# Patient Record
Sex: Female | Born: 1960 | Race: Black or African American | Hispanic: No | Marital: Single | Smoking: Current every day smoker
Health system: Southern US, Community
[De-identification: ages and names within clinical notes are randomized; demographics above are authoritative.]

## PROBLEM LIST (undated history)

## (undated) ENCOUNTER — Ambulatory Visit

## (undated) ENCOUNTER — Encounter

## (undated) ENCOUNTER — Telehealth
Attending: Student in an Organized Health Care Education/Training Program | Primary: Student in an Organized Health Care Education/Training Program

## (undated) ENCOUNTER — Encounter: Attending: Hematology & Oncology | Primary: Hematology & Oncology

## (undated) ENCOUNTER — Telehealth

## (undated) ENCOUNTER — Telehealth: Attending: Hematology & Oncology | Primary: Hematology & Oncology

## (undated) ENCOUNTER — Ambulatory Visit: Payer: MEDICAID | Attending: Hematology & Oncology | Primary: Hematology & Oncology

## (undated) ENCOUNTER — Encounter: Attending: Registered" | Primary: Registered"

## (undated) ENCOUNTER — Ambulatory Visit: Payer: MEDICAID

## (undated) ENCOUNTER — Ambulatory Visit: Payer: PRIVATE HEALTH INSURANCE | Attending: Hematology & Oncology | Primary: Hematology & Oncology

## (undated) ENCOUNTER — Encounter
Attending: Student in an Organized Health Care Education/Training Program | Primary: Student in an Organized Health Care Education/Training Program

## (undated) ENCOUNTER — Ambulatory Visit: Payer: PRIVATE HEALTH INSURANCE

## (undated) ENCOUNTER — Encounter: Attending: Internal Medicine | Primary: Internal Medicine

## (undated) ENCOUNTER — Encounter: Attending: Oncology | Primary: Oncology

## (undated) ENCOUNTER — Telehealth: Attending: Oncology | Primary: Oncology

## (undated) ENCOUNTER — Telehealth: Attending: Registered" | Primary: Registered"

## (undated) ENCOUNTER — Ambulatory Visit: Payer: MEDICAID | Attending: Registered" | Primary: Registered"

## (undated) ENCOUNTER — Inpatient Hospital Stay

## (undated) DIAGNOSIS — I10 Essential (primary) hypertension: Secondary | ICD-10-CM

## (undated) HISTORY — PX: TUBAL LIGATION: SHX77

## (undated) HISTORY — PX: ABDOMINAL SURGERY: SHX537

---
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
## 2004-05-16 ENCOUNTER — Other Ambulatory Visit: Payer: Self-pay

## 2005-06-11 ENCOUNTER — Emergency Department: Payer: Self-pay | Admitting: Emergency Medicine

## 2005-06-14 ENCOUNTER — Emergency Department: Payer: Self-pay | Admitting: Emergency Medicine

## 2007-08-11 ENCOUNTER — Emergency Department: Payer: Self-pay | Admitting: Internal Medicine

## 2007-11-10 ENCOUNTER — Emergency Department: Payer: Self-pay | Admitting: Emergency Medicine

## 2007-12-30 DIAGNOSIS — J309 Allergic rhinitis, unspecified: Secondary | ICD-10-CM | POA: Insufficient documentation

## 2008-03-12 ENCOUNTER — Emergency Department: Payer: Self-pay | Admitting: Emergency Medicine

## 2008-06-19 ENCOUNTER — Emergency Department: Payer: Self-pay | Admitting: Emergency Medicine

## 2009-03-16 ENCOUNTER — Emergency Department: Payer: Self-pay | Admitting: Emergency Medicine

## 2009-08-15 ENCOUNTER — Emergency Department: Payer: Self-pay

## 2009-10-19 IMAGING — CT CT ABD-PELV W/O CM
1 of 2 series · 15 of 32 positions shown, 19 images · non-contrast
Comparison: none

REASON FOR EXAM: (1) rlq /flank pain; (2) rlq /flank pain
COMMENTS:

[Series 2: stone · axial · 0.64mm/px · z∈[-176,+226]mm · 15 of 146 slices shown, 19 images]
[im 6/146  soft-tissue]
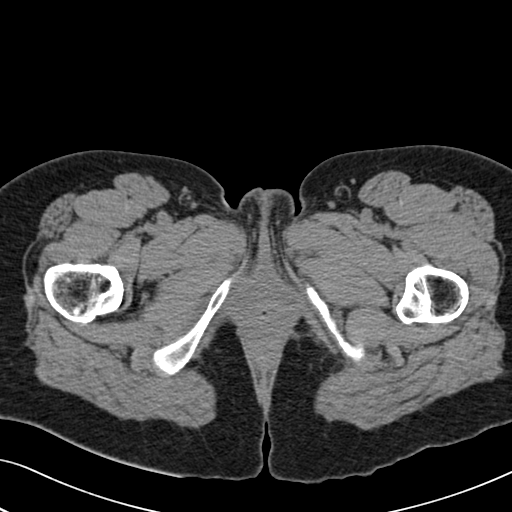
[im 6/146  bone]
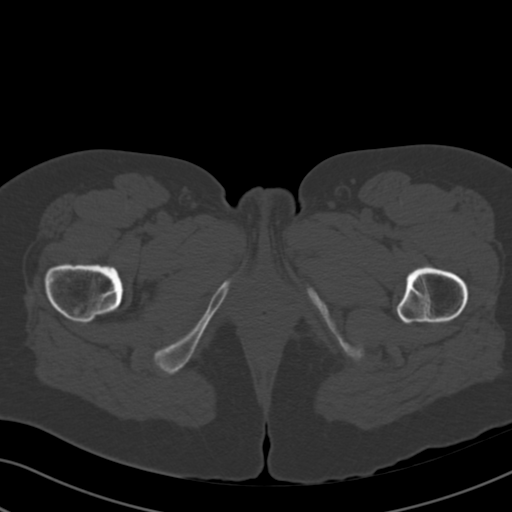
[im 17/146  soft-tissue]
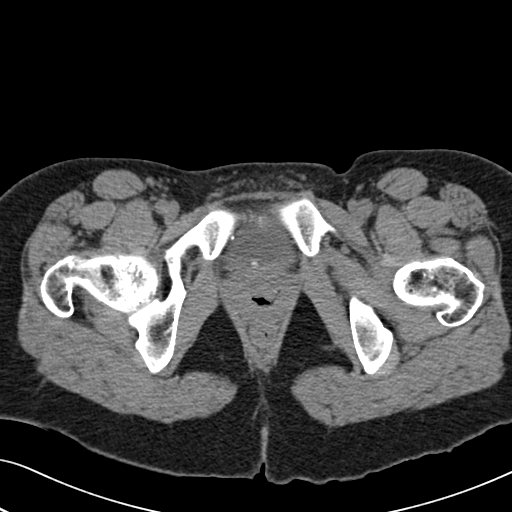
[im 28/146  soft-tissue]
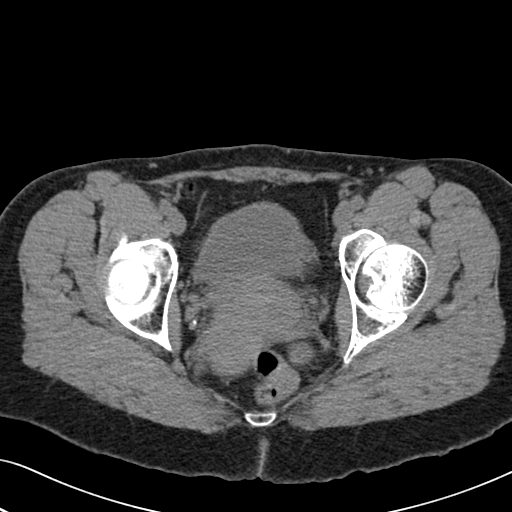
[im 40/146  soft-tissue]
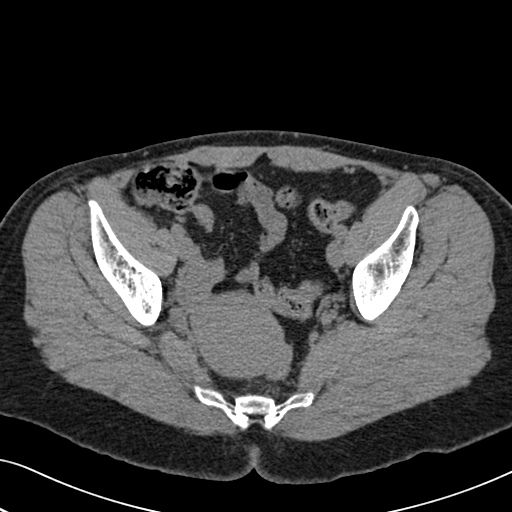
[im 51/146  soft-tissue]
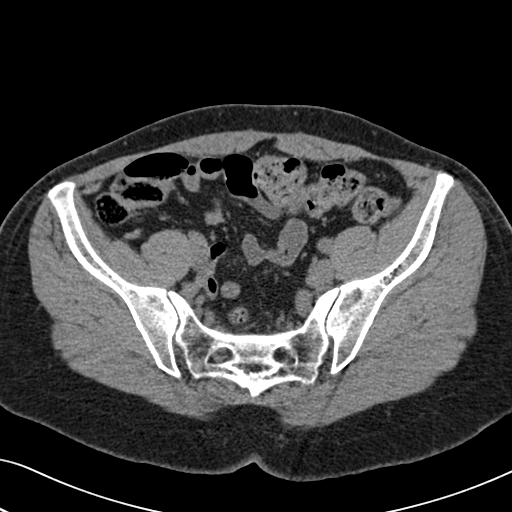
[im 62/146  soft-tissue]
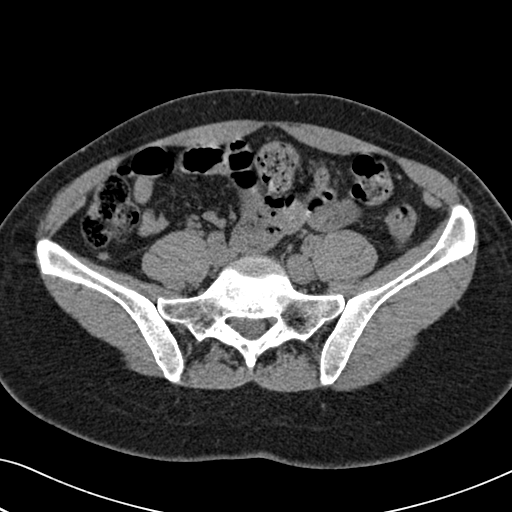
[im 73/146  soft-tissue]
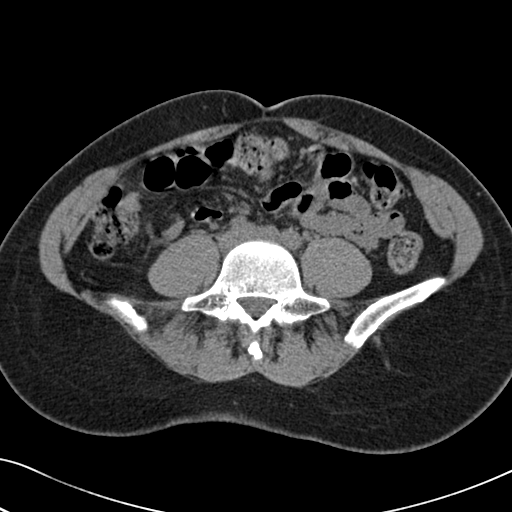
[im 84/146  soft-tissue]
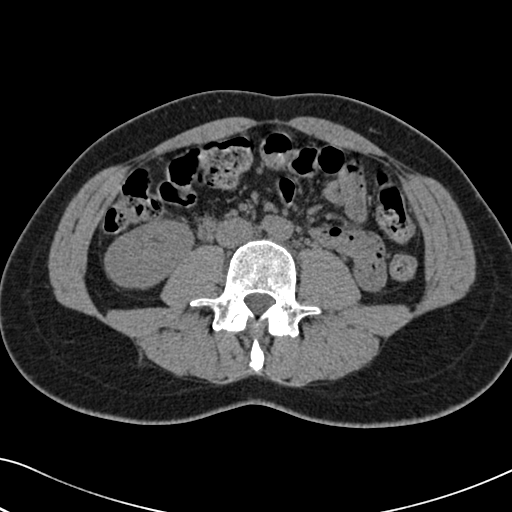
[im 95/146  soft-tissue]
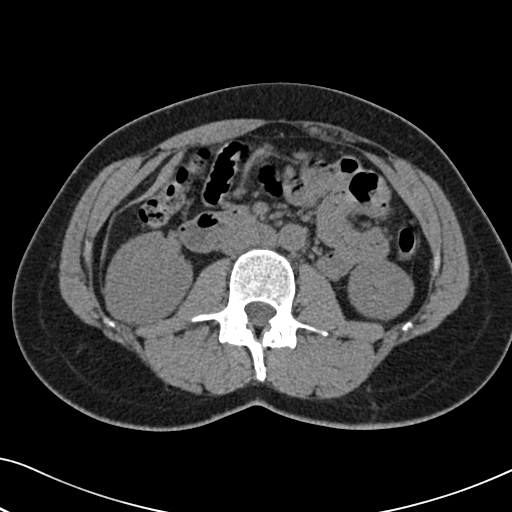
[im 95/146  bone]
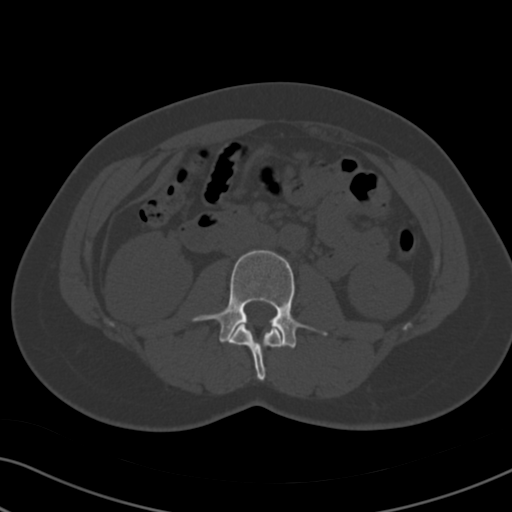
[im 106/146  soft-tissue]
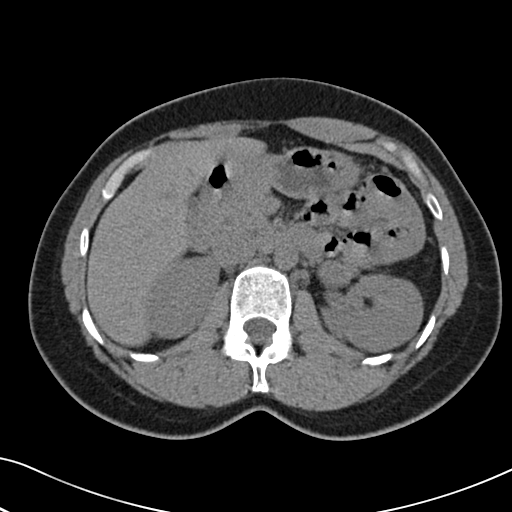
[im 118/146  soft-tissue]
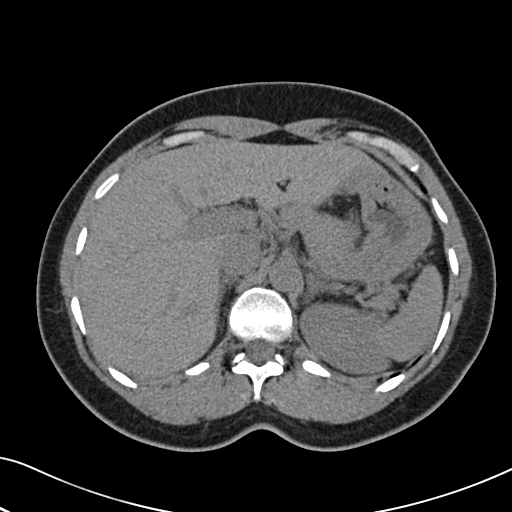
[im 123/146  lung]
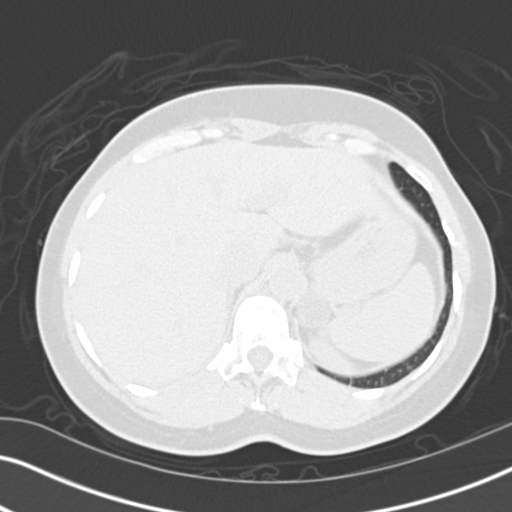
[im 129/146  soft-tissue]
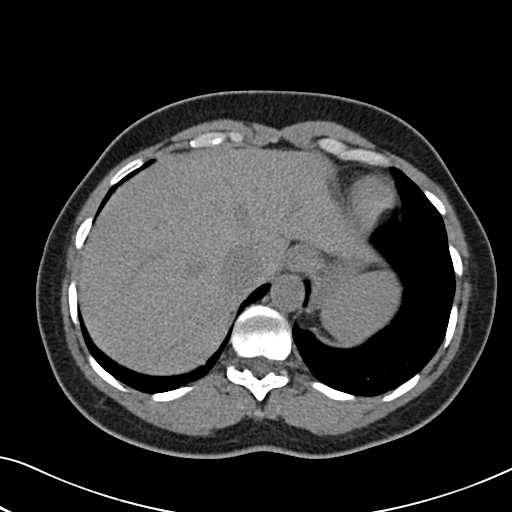
[im 129/146  lung]
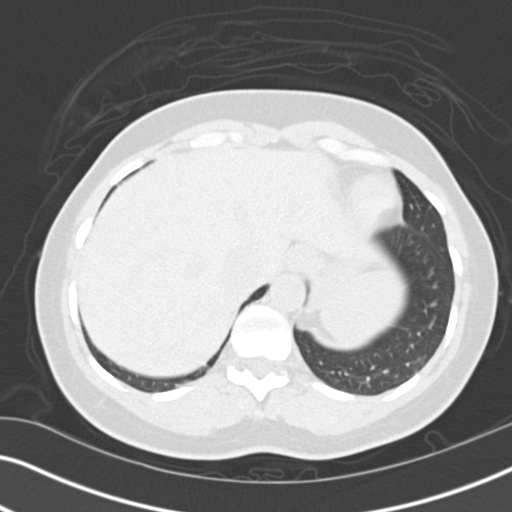
[im 134/146  lung]
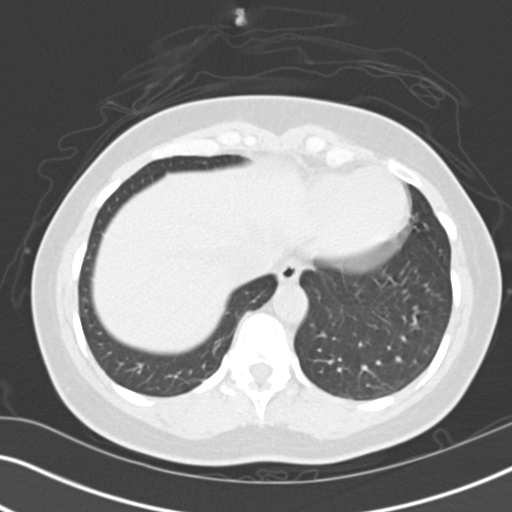
[im 140/146  soft-tissue]
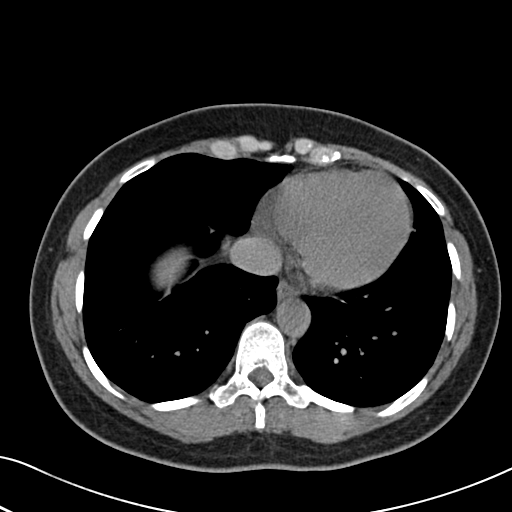
[im 140/146  lung]
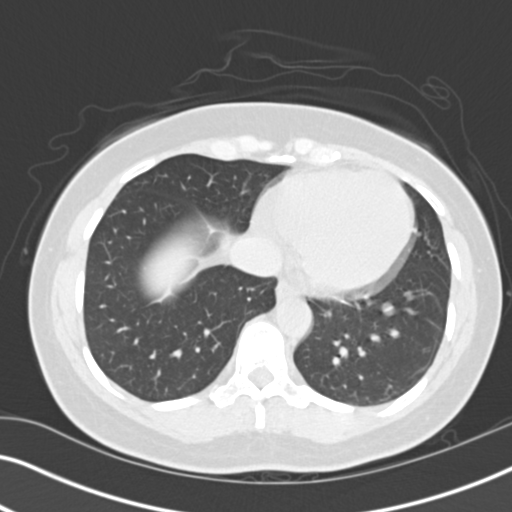

[15 of 32 positions shown; findings below may reference images not displayed]

PROCEDURE:     CT  - CT ABDOMEN AND PELVIS W[DATE]  [DATE]

RESULT:     The study was performed without IV contrast as requested. The
patient is undergoing evaluation for RIGHT flank discomfort.

The kidneys exhibit normal density. There is no evidence of hydronephrosis
or calcified stones. The perinephric fat is normal in density bilaterally.
Along the expected course of the ureters, I see no abnormal calcification.
The urinary bladder is normal in appearance. There is a calcification that
lies along the extreme inferior aspect of the urinary bladder just to the
RIGHT of midline that may reflect a recently passed stone. There are
phleboliths within the pelvis. The uterus and adnexal structures exhibit no
acute abnormality. The appendix is demonstrated and is normal in appearance.
The unopacified loops of small and large bowel are normal in appearance. The
lumbar vertebral bodies are preserved in height.

The liver, spleen, nondistended stomach, pancreas, and RIGHT adrenal gland
are normal in appearance. The LEFT adrenal gland is enlarged and low density
measuring 2.0 cm in diameter. The Hounsfield measurement is approximately
-15. The gallbladder is contracted or surgically absent. The lung bases are
clear.
IMPRESSION: 1. I do not see evidence of urinary tract stones currently. There is a
calcification in the lumen of the urinary bladder that could reflect a
recently passed stone. No other abnormality of the kidneys is identified.
2. The appendix is normal in appearance. There is no evidence of bowel
obstruction or diverticulitis.
3. I see no acute abnormality of the uterus or ovaries where visualized.
There is a trace of free fluid in the pelvis.
4. The gallbladder is either contracted or surgically absent.
5. There is enlargement of the LEFT adrenal gland. Further evaluation with
MRI electively would be useful.

This report was called to Dr. Nomasibulele in the [HOSPITAL] the
conclusion of the study.

## 2010-09-22 ENCOUNTER — Inpatient Hospital Stay (HOSPITAL_COMMUNITY)
Admission: AD | Admit: 2010-09-22 | Discharge: 2010-09-22 | Payer: Self-pay | Source: Home / Self Care | Attending: Obstetrics & Gynecology | Admitting: Obstetrics & Gynecology

## 2010-09-22 LAB — CBC
HCT: 35.5 % — ABNORMAL LOW (ref 36.0–46.0)
Hemoglobin: 11.2 g/dL — ABNORMAL LOW (ref 12.0–15.0)
MCHC: 31.5 g/dL (ref 30.0–36.0)
MCV: 80.7 fL (ref 78.0–100.0)

## 2010-09-22 LAB — POCT PREGNANCY, URINE: Preg Test, Ur: NEGATIVE

## 2010-09-22 LAB — URINALYSIS, ROUTINE W REFLEX MICROSCOPIC
Ketones, ur: NEGATIVE mg/dL
Leukocytes, UA: NEGATIVE
Protein, ur: NEGATIVE mg/dL
Urine Glucose, Fasting: NEGATIVE mg/dL
Urobilinogen, UA: 0.2 mg/dL (ref 0.0–1.0)

## 2010-09-22 LAB — WET PREP, GENITAL: Trich, Wet Prep: NONE SEEN

## 2010-09-22 LAB — URINE MICROSCOPIC-ADD ON

## 2010-09-23 LAB — GC/CHLAMYDIA PROBE AMP, GENITAL
Chlamydia, DNA Probe: NEGATIVE
GC Probe Amp, Genital: NEGATIVE
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
## 2011-06-20 ENCOUNTER — Emergency Department: Payer: Self-pay | Admitting: Emergency Medicine

## 2011-10-13 ENCOUNTER — Emergency Department: Payer: Self-pay | Admitting: *Deleted

## 2011-10-13 LAB — COMPREHENSIVE METABOLIC PANEL
Alkaline Phosphatase: 109 U/L (ref 50–136)
Calcium, Total: 9.1 mg/dL (ref 8.5–10.1)
Co2: 30 mmol/L (ref 21–32)
EGFR (African American): >60
EGFR (Non-African Amer.): 58 — ABNORMAL LOW
Osmolality: 281 (ref 275–301)
SGOT(AST): 27 U/L (ref 15–37)
SGPT (ALT): 26 U/L
Sodium: 141 mmol/L (ref 136–145)

## 2011-10-13 LAB — URINALYSIS, COMPLETE
Blood: NEGATIVE
Leukocyte Esterase: NEGATIVE
Ph: 7 (ref 4.5–8.0)
Protein: NEGATIVE
RBC,UR: 7 /HPF (ref 0–5)

## 2011-10-13 LAB — CBC
HCT: 40.9 % (ref 35.0–47.0)
MCHC: 33.3 g/dL (ref 32.0–36.0)
MCV: 79 fL — ABNORMAL LOW (ref 80–100)
RBC: 5.16 10*6/uL (ref 3.80–5.20)
RDW: 15.2 % — ABNORMAL HIGH (ref 11.5–14.5)
WBC: 4.9 10*3/uL (ref 3.6–11.0)

## 2011-10-13 LAB — LIPASE, BLOOD: Lipase: 138 U/L (ref 73–393)

## 2011-10-14 LAB — PREGNANCY, URINE: Pregnancy Test, Urine: NEGATIVE m[IU]/mL

## 2011-11-03 DIAGNOSIS — F17200 Nicotine dependence, unspecified, uncomplicated: Secondary | ICD-10-CM | POA: Insufficient documentation

## 2011-11-03 DIAGNOSIS — I10 Essential (primary) hypertension: Secondary | ICD-10-CM | POA: Insufficient documentation

## 2012-04-05 DIAGNOSIS — Z8619 Personal history of other infectious and parasitic diseases: Secondary | ICD-10-CM | POA: Insufficient documentation

## 2013-01-25 ENCOUNTER — Emergency Department: Payer: Self-pay | Admitting: Emergency Medicine

## 2013-04-15 DIAGNOSIS — R519 Headache, unspecified: Secondary | ICD-10-CM | POA: Insufficient documentation

## 2013-06-05 DIAGNOSIS — Z0000 Encounter for general adult medical examination without abnormal findings: Secondary | ICD-10-CM | POA: Insufficient documentation

## 2013-06-05 DIAGNOSIS — N951 Menopausal and female climacteric states: Secondary | ICD-10-CM | POA: Insufficient documentation

## 2013-06-05 DIAGNOSIS — D259 Leiomyoma of uterus, unspecified: Secondary | ICD-10-CM | POA: Insufficient documentation

## 2014-04-19 DIAGNOSIS — R252 Cramp and spasm: Secondary | ICD-10-CM | POA: Insufficient documentation

## 2014-06-14 DIAGNOSIS — F419 Anxiety disorder, unspecified: Secondary | ICD-10-CM | POA: Insufficient documentation

## 2015-05-15 ENCOUNTER — Encounter: Payer: Self-pay | Admitting: Emergency Medicine

## 2015-05-15 ENCOUNTER — Emergency Department
Admission: EM | Admit: 2015-05-15 | Discharge: 2015-05-15 | Disposition: A | Discharge status: Home / Self Care | Payer: Medicaid Other | Attending: Emergency Medicine | Admitting: Emergency Medicine

## 2015-05-15 DIAGNOSIS — R102 Pelvic and perineal pain: Secondary | ICD-10-CM | POA: Insufficient documentation

## 2015-05-15 DIAGNOSIS — R1013 Epigastric pain: Secondary | ICD-10-CM | POA: Diagnosis not present

## 2015-05-15 DIAGNOSIS — Z87891 Personal history of nicotine dependence: Secondary | ICD-10-CM | POA: Diagnosis not present

## 2015-05-15 DIAGNOSIS — R110 Nausea: Secondary | ICD-10-CM | POA: Insufficient documentation

## 2015-05-15 DIAGNOSIS — R1030 Lower abdominal pain, unspecified: Secondary | ICD-10-CM | POA: Diagnosis not present

## 2015-05-15 DIAGNOSIS — R001 Bradycardia, unspecified: Secondary | ICD-10-CM | POA: Insufficient documentation

## 2015-05-15 DIAGNOSIS — R309 Painful micturition, unspecified: Secondary | ICD-10-CM | POA: Insufficient documentation

## 2015-05-15 DIAGNOSIS — I10 Essential (primary) hypertension: Secondary | ICD-10-CM | POA: Diagnosis not present

## 2015-05-15 DIAGNOSIS — R300 Dysuria: Secondary | ICD-10-CM

## 2015-05-15 HISTORY — DX: Essential (primary) hypertension: I10

## 2015-05-15 LAB — URINALYSIS COMPLETE WITH MICROSCOPIC (ARMC ONLY)
BACTERIA UA: NONE SEEN
Bilirubin Urine: NEGATIVE
Glucose, UA: NEGATIVE mg/dL
Ketones, ur: NEGATIVE mg/dL
LEUKOCYTES UA: NEGATIVE
Nitrite: NEGATIVE
PH: 5 (ref 5.0–8.0)
PROTEIN: NEGATIVE mg/dL
Specific Gravity, Urine: 1.024 (ref 1.005–1.030)

## 2015-05-15 LAB — COMPREHENSIVE METABOLIC PANEL
ALT: 16 U/L (ref 14–54)
AST: 19 U/L (ref 15–41)
Albumin: 4.2 g/dL (ref 3.5–5.0)
Alkaline Phosphatase: 103 U/L (ref 38–126)
Anion gap: 7 (ref 5–15)
BUN: 22 mg/dL — ABNORMAL HIGH (ref 6–20)
CHLORIDE: 103 mmol/L (ref 101–111)
CO2: 29 mmol/L (ref 22–32)
CREATININE: 0.92 mg/dL (ref 0.44–1.00)
Calcium: 9.6 mg/dL (ref 8.9–10.3)
GFR calc non Af Amer: >60 mL/min (ref >60)
Glucose, Bld: 107 mg/dL — ABNORMAL HIGH (ref 65–99)
POTASSIUM: 3.9 mmol/L (ref 3.5–5.1)
SODIUM: 139 mmol/L (ref 135–145)
Total Bilirubin: 0.1 mg/dL — ABNORMAL LOW (ref 0.3–1.2)
Total Protein: 7.6 g/dL (ref 6.5–8.1)

## 2015-05-15 LAB — CBC
HEMATOCRIT: 43.4 % (ref 35.0–47.0)
HEMOGLOBIN: 14.1 g/dL (ref 12.0–16.0)
MCH: 26.5 pg (ref 26.0–34.0)
MCHC: 32.4 g/dL (ref 32.0–36.0)
MCV: 81.9 fL (ref 80.0–100.0)
PLATELETS: 210 10*3/uL (ref 150–440)
RBC: 5.3 MIL/uL — AB (ref 3.80–5.20)
RDW: 13.1 % (ref 11.5–14.5)
WBC: 6.7 10*3/uL (ref 3.6–11.0)

## 2015-05-15 LAB — CHLAMYDIA/NGC RT PCR (ARMC ONLY)
CHLAMYDIA TR: NOT DETECTED
N GONORRHOEAE: NOT DETECTED

## 2015-05-15 LAB — WET PREP, GENITAL
Clue Cells Wet Prep HPF POC: NONE SEEN
Trich, Wet Prep: NONE SEEN
Yeast Wet Prep HPF POC: NONE SEEN

## 2015-05-15 LAB — LIPASE, BLOOD: LIPASE: 33 U/L (ref 22–51)

## 2015-05-15 MED ORDER — IBUPROFEN 600 MG PO TABS: 600.0000 mg | ORAL_TABLET | Freq: Three times a day (TID) | ORAL | Status: DC | PRN

## 2015-05-15 MED ORDER — OXYCODONE-ACETAMINOPHEN 5-325 MG PO TABS
1.0000 | ORAL_TABLET | Freq: Once | ORAL | Status: AC
  Administered 2015-05-15: 1 via ORAL
  Filled 2015-05-15: qty 1

## 2015-05-15 MED ORDER — ONDANSETRON HCL 4 MG PO TABS: 4.0000 mg | ORAL_TABLET | Freq: Three times a day (TID) | ORAL | Status: AC | PRN

## 2015-05-15 MED ORDER — OXYCODONE-ACETAMINOPHEN 5-325 MG PO TABS: 1.0000 | ORAL_TABLET | Freq: Four times a day (QID) | ORAL | Status: DC | PRN

## 2015-05-15 NOTE — ED Provider Notes (Signed)
[facility] Emergency Department Provider Note  ____________________________________________  Time seen: Approximately 5:58 PM  I have reviewed the triage vital signs and the nursing notes.   HISTORY  Chief Complaint Abdominal Pain    HPI Kristie Macdonald is a 54 y.o. female with a history of HTN presenting with abdominal pain times several months. Patient reports that for the past 3-4 months she has had daily "soreness" that comes and goes diffusely in the lower abdomen and radiating into her vagina. Occasional epigastric pain as well.. There is no change with position, activity, food, or in the time of day. She reports the pain has been worse over the last 5 days and is now 10/10 and "unbearable." She has intermittent occasional nausea which she does not have right now. No vomiting, no constipation or diarrhea, dark stools, or bloody stools. No fevers, chills, or change in vaginal discharge.   Past Medical History  Diagnosis Date  . Hypertension     There are no active problems to display for this patient.   Past Surgical History  Procedure Laterality Date  . Abdominal surgery    . Cesarean section      Current Outpatient Rx  Name  Route  Sig  Dispense  Refill  . ibuprofen (ADVIL,MOTRIN) 600 MG tablet   Oral   Take 1 tablet (600 mg total) by mouth every 8 (eight) hours as needed for moderate pain (with food).   15 tablet   0   . ondansetron (ZOFRAN) 4 MG tablet   Oral   Take 1 tablet (4 mg total) by mouth every 8 (eight) hours as needed for nausea or vomiting.   15 tablet   0   . oxyCODONE-acetaminophen (PERCOCET/ROXICET) 5-325 MG per tablet   Oral   Take 1 tablet by mouth every 6 (six) hours as needed for severe pain.   12 tablet   0     Allergies Review of patient's allergies indicates no known allergies.  No family history on file.  Social History Social History  Substance Use Topics  . Smoking status: Former Research scientist (life sciences)  .  Smokeless tobacco: None  . Alcohol Use: No    Review of Systems Constitutional: No fever/chills Eyes: No visual changes. ENT: No sore throat. Cardiovascular: Denies chest pain, palpitations. Respiratory: Denies shortness of breath.  No cough. Gastrointestinal: LLQ, RLQ and epigastric pain.  Intermittent nausea, no vomiting.  No diarrhea.  No constipation. Genitourinary: Occasional burning with urination, increased frequency "when I drink a lot." No change in vaginal discharge. Musculoskeletal: Negative for back pain. Skin: Negative for rash. Neurological: Negative for headaches, focal weakness or numbness.  10-point ROS otherwise negative.  ____________________________________________   PHYSICAL EXAM:  VITAL SIGNS: ED Triage Vitals  Enc Vitals Group     BP 05/15/15 1539 157/83 mmHg     Pulse Rate 05/15/15 1539 79     Resp 05/15/15 1539 20     Temp 05/15/15 1539 97.8 F (36.6 C)     Temp Source 05/15/15 1539 Oral     SpO2 05/15/15 1539 100 %     Weight 05/15/15 1539 155 lb (70.308 kg)     Height 05/15/15 1539 5\' 7"  (1.702 m)     Head Cir --      Peak Flow --      Pain Score 05/15/15 1539 10     Pain Loc --      Pain Edu? --      Excl. in [place]? --  Constitutional: Alert and oriented. Well appearing and in no acute distress. Answer question appropriately. Eyes: Conjunctivae are normal.  EOMI. Head: Atraumatic. Nose: No congestion/rhinnorhea. Mouth/Throat: Mucous membranes are moist.  Neck: No stridor.  Supple.   Cardiovascular: Normal rate, regular rhythm. No murmurs, rubs or gallops.  Respiratory: Normal respiratory effort.  No retractions. Lungs CTAB.  No wheezes, rales or ronchi. Gastrointestinal: Soft and nondistended. Diffuse tenderness to palpation in the lower abdomen. No rebound, guarding, or peritoneal signs. Genitourinary: Normal-appearing external genitalia without lesions. Normal vaginal exam with physiologic discharge, normal-appearing cervix, normal  vaginal wall tissue. Bimanual exam is negative for CMT.  Diffuse lower pelvis mild ttp w/o focality. No palpable masses. Musculoskeletal: No LE edema.  Neurologic:  Normal speech and language. No gross focal neurologic deficits are appreciated.  Skin:  Skin is warm, dry and intact. No rash noted. Psychiatric: Mood and affect are normal. Speech and behavior are normal.  Normal judgement.  ____________________________________________   LABS (all labs ordered are listed, but only abnormal results are displayed)  Labs Reviewed  COMPREHENSIVE METABOLIC PANEL - Abnormal; Notable for the following:    Glucose, Bld 107 (*)    BUN 22 (*)    Total Bilirubin 0.1 (*)    All other components within normal limits  CBC - Abnormal; Notable for the following:    RBC 5.30 (*)    All other components within normal limits  URINALYSIS COMPLETEWITH MICROSCOPIC ([facility] ONLY) - Abnormal; Notable for the following:    Color, Urine YELLOW (*)    APPearance CLEAR (*)    Hgb urine dipstick 1+ (*)    Squamous Epithelial / LPF 0-5 (*)    All other components within normal limits  CHLAMYDIA/NGC RT PCR ([facility] ONLY)  WET PREP, GENITAL  LIPASE, BLOOD   ____________________________________________  EKG  ED ECG REPORT I, Eula Listen, the attending physician, personally viewed and interpreted this ECG.   Date: 05/15/2015  EKG Time: 16:00  Rate: 55  Rhythm: sinus bradycardia  Axis: Leftward  Intervals:none  ST&T Change: Nonspecific T wave inversions in V1.  EKG is unchanged from prior in 2005.  ____________________________________________  RADIOLOGY  No results found.  ____________________________________________   PROCEDURES  Procedure(s) performed: None  Critical Care performed: No ____________________________________________   INITIAL IMPRESSION / ASSESSMENT AND PLAN / ED COURSE  Pertinent labs & imaging results that were available during my care of the patient were  reviewed by me and considered in my medical decision making (see chart for details).  54 y.o. female with a history of HTN presenting with months of abdominal pain, worse over the last several days. Her vital signs are stable and she is afebrile. She does not give a history or physical exam findings that would be consistent with acute surgical pathology. Differential includes STI, appendicitis or other acute abdominal pathology or infection is less likely given the length of the time frame and lack of infectious symptoms, rule out UTI.   Will get a pelvic exam to evaluate for infection. Labs are reassuring with no elevation in the white blood cell count, negative for UTI. After pelvic exam, we'll treat if infection found, and plan discharge with PMD for further evaluation.  ----------------------------------------- 7:04 PM on 05/15/2015 -----------------------------------------  Patient pain has completely resolved with a single Percocet. She is feeling much better. Her urine does not show infection, her labs are reassuring. Pelvic exam is also reassuring. I have discussed the results with the patient will follow-up with her primary care  physician. She understands that she'll need to follow-up her pelvic cultures. Plan discharge.  ----------------------------------------- 7:20 PM on 05/15/2015 -----------------------------------------  The patient does have asymptomatic bradycardia. She is beta blocked for her hypertension. She does know that her heart rate is in the 50s today and denies any shortness of breath, lightheadedness, or chest pain, or palpitations. When she makes her follow point and she will discuss this with her primary care physician. ____________________________________________  FINAL CLINICAL IMPRESSION(S) / ED DIAGNOSES  Final diagnoses:  Lower abdominal pain  Vaginal pain  Burning with urination  Nausea without vomiting      NEW MEDICATIONS STARTED DURING THIS  VISIT:  New Prescriptions   IBUPROFEN (ADVIL,MOTRIN) 600 MG TABLET    Take 1 tablet (600 mg total) by mouth every 8 (eight) hours as needed for moderate pain (with food).   ONDANSETRON (ZOFRAN) 4 MG TABLET    Take 1 tablet (4 mg total) by mouth every 8 (eight) hours as needed for nausea or vomiting.   OXYCODONE-ACETAMINOPHEN (PERCOCET/ROXICET) 5-325 MG PER TABLET    Take 1 tablet by mouth every 6 (six) hours as needed for severe pain.     Eula Listen, MD 05/15/15 Lurena Nida

## 2015-05-15 NOTE — Discharge Instructions (Signed)
Abdominal Pain Many things can cause abdominal pain. Usually, abdominal pain is not caused by a disease and will improve without treatment. It can often be observed and treated at home. Your health care provider will do a physical exam and possibly order blood tests and X-rays to help determine the seriousness of your pain. However, in many cases, more time must pass before a clear cause of the pain can be found. Before that point, your health care provider may not know if you need more testing or further treatment. HOME CARE INSTRUCTIONS  Monitor your abdominal pain for any changes. The following actions may help to alleviate any discomfort you are experiencing:  Only take over-the-counter or prescription medicines as directed by your health care provider.  Do not take laxatives unless directed to do so by your health care provider.  Try a clear liquid diet (broth, tea, or water) as directed by your health care provider. Slowly move to a bland diet as tolerated. SEEK MEDICAL CARE IF:  You have unexplained abdominal pain.  You have abdominal pain associated with nausea or diarrhea.  You have pain when you urinate or have a bowel movement.  You experience abdominal pain that wakes you in the night.  You have abdominal pain that is worsened or improved by eating food.  You have abdominal pain that is worsened with eating fatty foods.  You have a fever. SEEK IMMEDIATE MEDICAL CARE IF:   Your pain does not go away within 2 hours.  You keep throwing up (vomiting).  Your pain is felt only in portions of the abdomen, such as the right side or the left lower portion of the abdomen.  You pass bloody or black tarry stools. MAKE SURE YOU:  Understand these instructions.   Will watch your condition.   Will get help right away if you are not doing well or get worse.  Document Released: 05/21/2005 Document Revised: 08/16/2013 Document Reviewed: 04/20/2013 [facility] Patient Information  2015 [place], [place]. This information is not intended to replace advice given to you by your health care provider. Make sure you discuss any questions you have with your health care provider.  Please return to the emergency department if you develop severe pain, fever, inability to keep down fluids, or any other symptoms concerning to you.

## 2015-05-15 NOTE — ED Notes (Signed)
Pt reports abdominal cramping that radiates down into legs x1 week, reports nausea.

## 2015-09-06 ENCOUNTER — Encounter: Payer: Medicaid Other | Admitting: Family Medicine

## 2015-09-20 ENCOUNTER — Encounter: Payer: Medicaid Other | Admitting: Medical

## 2016-01-30 ENCOUNTER — Encounter: Payer: Medicaid Other | Admitting: Obstetrics & Gynecology

## 2016-03-10 ENCOUNTER — Encounter: Payer: Medicaid Other | Admitting: Family Medicine

## 2017-03-23 ENCOUNTER — Emergency Department
Admission: EM | Admit: 2017-03-23 | Discharge: 2017-03-24 | Disposition: A | Discharge status: Home / Self Care | Payer: Medicaid Other | Attending: Emergency Medicine | Admitting: Emergency Medicine

## 2017-03-23 DIAGNOSIS — Y999 Unspecified external cause status: Secondary | ICD-10-CM | POA: Insufficient documentation

## 2017-03-23 DIAGNOSIS — X58XXXA Exposure to other specified factors, initial encounter: Secondary | ICD-10-CM | POA: Diagnosis not present

## 2017-03-23 DIAGNOSIS — Y939 Activity, unspecified: Secondary | ICD-10-CM | POA: Diagnosis not present

## 2017-03-23 DIAGNOSIS — S20469A Insect bite (nonvenomous) of unspecified back wall of thorax, initial encounter: Secondary | ICD-10-CM | POA: Insufficient documentation

## 2017-03-23 DIAGNOSIS — Z87891 Personal history of nicotine dependence: Secondary | ICD-10-CM | POA: Insufficient documentation

## 2017-03-23 DIAGNOSIS — I10 Essential (primary) hypertension: Secondary | ICD-10-CM | POA: Insufficient documentation

## 2017-03-23 DIAGNOSIS — Y929 Unspecified place or not applicable: Secondary | ICD-10-CM | POA: Diagnosis not present

## 2017-03-23 DIAGNOSIS — W57XXXA Bitten or stung by nonvenomous insect and other nonvenomous arthropods, initial encounter: Secondary | ICD-10-CM | POA: Insufficient documentation

## 2017-03-23 DIAGNOSIS — Z79899 Other long term (current) drug therapy: Secondary | ICD-10-CM | POA: Diagnosis not present

## 2017-03-23 MED ORDER — CEPHALEXIN 500 MG PO CAPS
500.0000 mg | ORAL_CAPSULE | Freq: Once | ORAL | Status: AC
  Administered 2017-03-24: 500 mg via ORAL
  Filled 2017-03-23: qty 1

## 2017-03-23 MED ORDER — IBUPROFEN 600 MG PO TABS
600.0000 mg | ORAL_TABLET | Freq: Once | ORAL | Status: AC
  Administered 2017-03-24: 600 mg via ORAL
  Filled 2017-03-23: qty 1

## 2017-03-23 MED ORDER — DIPHENHYDRAMINE HCL 25 MG PO CAPS
50.0000 mg | ORAL_CAPSULE | Freq: Once | ORAL | Status: AC
  Administered 2017-03-24: 50 mg via ORAL
  Filled 2017-03-23: qty 2

## 2017-03-23 NOTE — ED Provider Notes (Signed)
[facility] Emergency Department Provider Note    First MD Initiated Contact with Patient 03/23/17 2323     (approximate)  I have reviewed the triage vital signs and the nursing notes.   HISTORY  Chief Complaint insect bites    HPI Kristie Macdonald is a 56 y.o. female with history of hypertension presents to the emergency department with history of multiple insect bites to the back which occurred on Friday (3 days ago). Patient states that the area is tender to the touch and pruritic. Patient denies any fever or febrile on presentation.   Past Medical History:  Diagnosis Date  . Hypertension     There are no active problems to display for this patient.   Past Surgical History:  Procedure Laterality Date  . ABDOMINAL SURGERY    . CESAREAN SECTION      Prior to Admission medications   Medication Sig Start Date End Date Taking? Authorizing Provider  ibuprofen (ADVIL,MOTRIN) 600 MG tablet Take 1 tablet (600 mg total) by mouth every 8 (eight) hours as needed for moderate pain (with food). 05/15/15   Eula Listen, MD  oxyCODONE-acetaminophen (PERCOCET/ROXICET) 5-325 MG per tablet Take 1 tablet by mouth every 6 (six) hours as needed for severe pain. 05/15/15   Eula Listen, MD    Allergies No known drug allergies No family history on file.  Social History Social History  Substance Use Topics  . Smoking status: Former Research scientist (life sciences)  . Smokeless tobacco: Not on file  . Alcohol use No    Review of Systems Constitutional: No fever/chills Eyes: No visual changes. ENT: No sore throat. Cardiovascular: Denies chest pain. Respiratory: Denies shortness of breath. Gastrointestinal: No abdominal pain.  No nausea, no vomiting.  No diarrhea.  No constipation. Genitourinary: Negative for dysuria. Musculoskeletal: Negative for neck pain.  Negative for back pain. Integumentary: Negative for rash. Neurological: Negative for headaches, focal  weakness or numbness.   ____________________________________________   PHYSICAL EXAM:  VITAL SIGNS: ED Triage Vitals  Enc Vitals Group     BP 03/23/17 2241 (!) 242/97     Pulse Rate 03/23/17 2241 (!) 108     Resp 03/23/17 2241 19     Temp 03/23/17 2241 98.1 F (36.7 C)     Temp Source 03/23/17 2241 Oral     SpO2 03/23/17 2241 96 %     Weight 03/23/17 2242 68 kg (150 lb)     Height 03/23/17 2242 1.702 m (5\' 7" )     Head Circumference --      Peak Flow --      Pain Score 03/23/17 2246 5     Pain Loc --      Pain Edu? --      Excl. in [place]? --     Constitutional: Alert and oriented. Well appearing and in no acute distress. Eyes: Conjunctivae are normal.  Head: Atraumatic. Ears:  Healthy appearing ear canals and TMs bilaterally Nose: No congestion/rhinnorhea. Mouth/Throat: Mucous membranes are moist. Oropharynx non-erythematous. Neck: No stridor. Cardiovascular: Normal rate, regular rhythm. Good peripheral circulation. Grossly normal heart sounds. Respiratory: Normal respiratory effort.  No retractions. Lungs CTAB. Gastrointestinal: Soft and nontender. No distention.  Musculoskeletal: No lower extremity tenderness nor edema. No gross deformities of extremities. Neurologic:  Normal speech and language. No gross focal neurologic deficits are appreciated.  Skin: 5 distinct areas of induration with surrounding erythema noted on the patient's upper back bilaterally. Psychiatric: Mood and affect are normal. Speech and behavior are  normal.    Procedures   ____________________________________________   INITIAL IMPRESSION / ASSESSMENT AND PLAN / ED COURSE  Pertinent labs & imaging results that were available during my care of the patient were reviewed by me and considered in my medical decision making (see chart for details).  56 year old female presenting to the emergency department with multiple insect bites to the back concern for possible superimposed cellulitis. Patient  noted be markedly hypertensive on arrival however patient states "I was freaking out" patient's current blood pressure 186. Patient will be given Benadryl ibuprofen and Keflex.      ____________________________________________  FINAL CLINICAL IMPRESSION(S) / ED DIAGNOSES  Final diagnoses:  Insect bite, initial encounter     MEDICATIONS GIVEN DURING THIS VISIT:  Medications  cephALEXin (KEFLEX) capsule 500 mg (not administered)  diphenhydrAMINE (BENADRYL) capsule 50 mg (not administered)     NEW OUTPATIENT MEDICATIONS STARTED DURING THIS VISIT:  New Prescriptions   No medications on file    Modified Medications   No medications on file    Discontinued Medications   No medications on file     Note:  This document was prepared using Dragon voice recognition software and may include unintentional dictation errors.    Gregor Hams, MD 03/24/17 [phone]

## 2017-03-23 NOTE — ED Notes (Signed)
Report given to Iris RN 

## 2017-03-23 NOTE — ED Triage Notes (Addendum)
Pt presents to ED c/o 5 questionable insect bites to back x 2 days. Pt states she was outside on someone's porch prior to event. Pt states s/s of itching and pain. Pt "feels weird"; nervous in triage

## 2017-03-24 MED ORDER — CEPHALEXIN 500 MG PO CAPS: 500.0000 mg | ORAL_CAPSULE | Freq: Two times a day (BID) | ORAL | 0 refills | Status: AC

## 2017-03-24 NOTE — ED Notes (Signed)
Pt discharged to home.  Family member driving.  Discharge instructions reviewed.  Verbalized understanding.  No questions or concerns at this time.  Teach back verified.  Pt in NAD.  No items left in ED.   

## 2017-11-23 ENCOUNTER — Ambulatory Visit: Payer: Self-pay | Admitting: Podiatry

## 2017-12-09 ENCOUNTER — Encounter: Admit: 2017-12-09 | Discharge: 2017-12-10 | Payer: PRIVATE HEALTH INSURANCE

## 2017-12-09 DIAGNOSIS — Z1239 Encounter for other screening for malignant neoplasm of breast: Principal | ICD-10-CM

## 2017-12-14 ENCOUNTER — Ambulatory Visit: Payer: Self-pay | Admitting: Podiatry

## 2017-12-17 ENCOUNTER — Ambulatory Visit: Payer: Self-pay | Admitting: Podiatry

## 2017-12-24 ENCOUNTER — Ambulatory Visit: Payer: Self-pay | Admitting: Podiatry

## 2019-01-05 LAB — HM HIV SCREENING LAB: HM HIV Screening: NEGATIVE

## 2019-03-14 DIAGNOSIS — Z8619 Personal history of other infectious and parasitic diseases: Secondary | ICD-10-CM

## 2019-03-16 ENCOUNTER — Other Ambulatory Visit: Payer: Self-pay

## 2019-03-16 ENCOUNTER — Ambulatory Visit: Payer: Medicaid Other | Admitting: Physician Assistant

## 2019-03-16 ENCOUNTER — Encounter: Payer: Self-pay | Admitting: Physician Assistant

## 2019-03-16 DIAGNOSIS — Z113 Encounter for screening for infections with a predominantly sexual mode of transmission: Secondary | ICD-10-CM

## 2019-03-16 LAB — WET PREP FOR TRICH, YEAST, CLUE
Trichomonas Exam: NEGATIVE
Yeast Exam: NEGATIVE

## 2019-03-16 NOTE — Progress Notes (Signed)
Patient here for STD testing.Jenetta Downer, RN

## 2019-03-16 NOTE — Progress Notes (Signed)
Wet mount reviewed with provider and patient.  No tx per SO. Instructed patient to purchase OTC yeast cream if itching persists. Patient verbalized understanding Aileen Fass, RN

## 2019-03-16 NOTE — Progress Notes (Signed)
    STI clinic/screening visit  Subjective:  Kristie Macdonald is a 58 y.o. female being seen today for an STI screening visit. The patient reports they do have symptoms.  Patient has the following medical conditionshas Allergic rhinitis; Anxiety; Fibroid uterus; Headache; Hypertension, benign; Leg cramps; Menopausal symptoms; Preventative health care; Tobacco use disorder; and History of syphilis on their problem list.  Chief Complaint  Patient presents with  . SEXUALLY TRANSMITTED DISEASE    Patient reports that she has had some itching for about 1 week that is mostly external.  Denies other symptoms and change of detergent but states that she has stayed at a friend's house several times and noticed that the symptoms started after staying there so wonders if she came in contact with something there that caused itching.  HPI   See flowsheet for further details and programmatic requirements.    The following portions of the patient's history were reviewed and updated as appropriate: allergies, current medications, past family history, past medical history, past social history, past surgical history and problem list. Problem list updated.  Objective:  There were no vitals filed for this visit.  Physical Exam Constitutional:      General: She is not in acute distress.    Appearance: Normal appearance.  HENT:     Head: Normocephalic and atraumatic.     Mouth/Throat:     Mouth: Mucous membranes are moist.     Pharynx: Oropharynx is clear. No oropharyngeal exudate or posterior oropharyngeal erythema.  Neck:     Musculoskeletal: Neck supple.  Pulmonary:     Effort: Pulmonary effort is normal.  Abdominal:     Palpations: Abdomen is soft. There is no mass.     Tenderness: There is no abdominal tenderness. There is no guarding or rebound.  Genitourinary:    General: Normal vulva.     Rectum: Normal.     Comments: External genitalia/pubic area without nits, lice, edema, erythema,  lesions or inguinal adenopathy. Vagina with normal mucosa and discharge  Cervix without visible lesions Uterus firm, mobile, nt, no CMT, no adnexal tenderness or fullness. Lymphadenopathy:     Cervical: No cervical adenopathy.  Skin:    General: Skin is warm and dry.     Findings: No bruising, erythema, lesion or rash.  Neurological:     Mental Status: She is alert and oriented to person, place, and time.  Psychiatric:        Mood and Affect: Mood normal.        Behavior: Behavior normal.        Thought Content: Thought content normal.        Judgment: Judgment normal.       Assessment and Plan:  Kristie Macdonald is a 58 y.o. female presenting to the [facility] Department for STI screening  1. Screening for STD (sexually transmitted disease) Patient with complaint of itching and normal exam today Rec condoms with all sex Rec use OTC yeast cream for symptom relief while awaiting test results. Counseled that RN will call if she needs to RTC for any treatment once results are back.   - WET PREP FOR [place], YEAST, [place] LAB - Syphilis Serology,  Lab     No follow-ups on file.  No future appointments.  Jerene Dilling, PA

## 2019-03-18 ENCOUNTER — Encounter: Payer: Self-pay | Admitting: Family Medicine

## 2019-03-30 ENCOUNTER — Telehealth: Payer: Self-pay | Admitting: Family Medicine

## 2019-03-30 NOTE — Telephone Encounter (Signed)
PATIENT WAS HERE ON 03/16/2019 BUT IS STILL HAVING DISCOMFORT AND WANTS TO KNOW IF HER RESULTS ARE BACK IN AND IF THERE IS ANYTHING YA'LL CAN PRESCRIBE FOR THE ITCHINESS

## 2019-03-30 NOTE — Telephone Encounter (Signed)
Returned patient phone call x2. Busy signal both times. Next attempt 03/31/19 am. Hal Morales, RN

## 2019-03-31 NOTE — Telephone Encounter (Signed)
TC to patient. Busy signal, unable to LM.Jenetta Downer, RN

## 2019-04-01 ENCOUNTER — Telehealth: Payer: Self-pay | Admitting: General Practice

## 2019-04-01 ENCOUNTER — Telehealth: Payer: Self-pay

## 2019-04-01 ENCOUNTER — Telehealth: Payer: Self-pay | Admitting: Family Medicine

## 2019-04-01 NOTE — Telephone Encounter (Signed)
Patient is still having itchiness and wants to know if she can get another prescription or does she need to be seen again.

## 2019-04-01 NOTE — Telephone Encounter (Signed)
Returned patient phone call. No answer, left message of per consultation with provider C. Lazarus Salines, [place] for patient to follow up at Urgent Care this weekend for complaint of itching or can call health department Monday morning for an appt to evaluate itching symptoms. Hal Morales, RN

## 2019-04-01 NOTE — Telephone Encounter (Signed)
Call attempted to patient per patient request to speak with me.  Attempt made at 3:06pm and again at 3:25pm and there was no answer to either attempt and no option given to leave message for patient.

## 2019-04-01 NOTE — Telephone Encounter (Signed)
Patient returned call. Patient requesting a prescription for vaginal itching. Reviewed with patient notes from last appt of 03/16/19. Patient states she has tried "the cream we told her to get and it didn't help." Patient states she is having vaginal itching and needs cream for it. RN routed call to provider C. Lazarus Salines, [place] for patient request of above. Hal Morales, RN

## 2019-04-01 NOTE — Telephone Encounter (Signed)
Returned patient phone call. No answer, LMTC. Hal Morales, RN

## 2019-04-01 NOTE — Telephone Encounter (Signed)
PATIENT CALLED AND HAS NOT RECIEVED HER MEDICATION AT PHARMACY

## 2019-04-04 NOTE — Telephone Encounter (Signed)
TC to patient to ask if patient would like an appointment to assess itching symptoms. Patient has not returned calls from last week. LM, with number to call, that patient should call if she is still in need of services at [facility].Jenetta Downer, RN

## 2019-04-11 ENCOUNTER — Other Ambulatory Visit: Payer: Medicaid Other

## 2019-04-21 ENCOUNTER — Other Ambulatory Visit: Payer: Self-pay

## 2019-04-21 ENCOUNTER — Ambulatory Visit: Payer: Medicaid Other | Admitting: Nurse Practitioner

## 2019-04-21 DIAGNOSIS — N739 Female pelvic inflammatory disease, unspecified: Secondary | ICD-10-CM

## 2019-04-21 DIAGNOSIS — Z113 Encounter for screening for infections with a predominantly sexual mode of transmission: Secondary | ICD-10-CM | POA: Diagnosis not present
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
## 2019-04-22 LAB — WET PREP FOR TRICH, YEAST, CLUE
Trichomonas Exam: NEGATIVE
Yeast Exam: NEGATIVE

## 2019-04-22 MED ORDER — CEFTRIAXONE SODIUM 1 G IJ SOLR
1.0000 g | Freq: Once | INTRAMUSCULAR | Status: AC
  Administered 2019-04-21: 15:00:00 1 g via INTRAMUSCULAR

## 2019-04-22 MED ORDER — AZITHROMYCIN 250 MG PO TABS
1000.0000 mg | ORAL_TABLET | Freq: Every day | ORAL | Status: AC
  Administered 2019-04-21: 11:00:00 1000 mg via ORAL

## 2019-04-22 NOTE — Patient Instructions (Signed)
Pelvic Inflammatory Disease  Pelvic inflammatory disease (PID) is an infection in some or all of the female reproductive organs. PID can be in the womb (uterus), ovaries, fallopian tubes, or nearby tissues that are inside the lower belly area (pelvis). PID can lead to problems if it is not treated. What are the causes?  Germs (bacteria) that are spread during sex. This is the most common cause.  Germs in the vagina that are not spread during sex.  Germs that travel up from the vagina or cervix to the reproductive organs after: ? The birth of a baby. ? A miscarriage. ? An abortion. ? Pelvic surgery. ? Insertion of an intrauterine device (IUD). ? A sexual assault. What increases the risk?  Being younger than 56 years old.  Having sex at a young age.  Having a history of STI (sexually transmitted infection) or PID.  Not using barrier birth control, such as condoms.  Having a lot of sex partners.  Having sex with someone who has symptoms of an STI.  Using a douche.  Having an IUD put in place. What are the signs or symptoms?  Pain in the belly area.  Fever.  Chills.  Discharge from the vagina that is not normal.  Bleeding from the womb that is not normal.  Pain soon after the end of a menstrual period.  Pain when you pee (urinate).  Pain with sex.  Feeling sick to your stomach (nauseous) or throwing up (vomiting). How is this treated?  Antibiotic medicines. In very bad cases, these may be given through an IV tube.  Surgery. This is rare.  Efforts to stop the spread of the infection. Sex partners may need to be treated. It may take weeks until you feel all better. Your doctor may test you for infection again after you finish treatment. You should also be checked for HIV (human immunodeficiency virus). Follow these instructions at home:  Take over-the-counter and prescription medicines only as told by your doctor.  If you were prescribed an antibiotic  medicine, take it as told by your doctor. Do not stop taking it even if you start to feel better.  Do not have sex until treatment is done or as told by your doctor.  Tell your sex partner if you have PID. Your partner may need to be treated.  Keep all follow-up visits as told by your doctor. This is important. Contact a doctor if:  You have more fluid or fluid that is not normal coming from your vagina.  Your pain does not improve.  You throw up.  You have a fever.  You cannot take your medicines.  Your partner has an STI.  You have pain when you pee. Get help right away if:  You have more pain in the belly area.  You have chills.  You are not better in 72 hours with treatment. Summary  Pelvic inflammatory disease (PID) is caused by an infection in some or all of the female reproductive organs.  PID is a serious infection.  This infection is most often treated with antibiotics.  Do not have sex until treatment is done or as told by your doctor. This information is not intended to replace advice given to you by your health care provider. Make sure you discuss any questions you have with your health care provider. Document Released: 11/07/2008 Document Revised: 04/29/2018 Document Reviewed: 05/05/2018 Elsevier Patient Education  2020 Elsevier Inc.  

## 2019-04-25 ENCOUNTER — Encounter: Payer: Self-pay | Admitting: Nurse Practitioner

## 2019-04-25 MED ORDER — CEFTRIAXONE SODIUM 250 MG IJ SOLR
250.0000 mg | Freq: Once | INTRAMUSCULAR | Status: AC
  Administered 2019-04-21: 11:00:00 250 mg via INTRAMUSCULAR

## 2019-04-27 ENCOUNTER — Ambulatory Visit: Payer: Medicaid Other

## 2019-05-03 ENCOUNTER — Other Ambulatory Visit: Payer: Medicaid Other

## 2019-08-04 ENCOUNTER — Other Ambulatory Visit: Payer: Self-pay

## 2019-08-04 DIAGNOSIS — Z20822 Contact with and (suspected) exposure to covid-19: Secondary | ICD-10-CM

## 2019-08-06 LAB — NOVEL CORONAVIRUS, NAA: SARS-CoV-2, NAA: NOT DETECTED

## 2019-08-08 ENCOUNTER — Telehealth: Payer: Self-pay | Admitting: General Practice

## 2019-08-08 NOTE — Telephone Encounter (Signed)
Patient is calling for negaitve COVID results. Patient expressed understanding.

## 2019-10-16 NOTE — Progress Notes (Signed)
Chart reviewed by Pharmacist  Suzanne Walker PharmD, Contract Pharmacist at [facility]  

## 2020-02-02 ENCOUNTER — Ambulatory Visit: Payer: Medicaid Other

## 2020-02-06 ENCOUNTER — Ambulatory Visit: Payer: Medicaid Other

## 2020-02-16 ENCOUNTER — Ambulatory Visit: Payer: Medicaid Other

## 2020-02-17 ENCOUNTER — Ambulatory Visit: Payer: Medicaid Other

## 2020-03-09 ENCOUNTER — Ambulatory Visit: Payer: Medicaid Other

## 2020-05-17 ENCOUNTER — Other Ambulatory Visit: Payer: Self-pay

## 2020-05-17 ENCOUNTER — Encounter: Payer: Self-pay | Admitting: Advanced Practice Midwife

## 2020-05-17 ENCOUNTER — Ambulatory Visit: Payer: Medicaid Other | Admitting: Advanced Practice Midwife

## 2020-05-17 DIAGNOSIS — Z113 Encounter for screening for infections with a predominantly sexual mode of transmission: Secondary | ICD-10-CM

## 2020-05-17 DIAGNOSIS — F339 Major depressive disorder, recurrent, unspecified: Secondary | ICD-10-CM | POA: Insufficient documentation

## 2020-05-17 DIAGNOSIS — Z62810 Personal history of physical and sexual abuse in childhood: Secondary | ICD-10-CM | POA: Insufficient documentation

## 2020-05-17 LAB — WET PREP FOR TRICH, YEAST, CLUE
Trichomonas Exam: NEGATIVE
Yeast Exam: NEGATIVE

## 2020-05-17 NOTE — Progress Notes (Signed)
Pt refused to have temperature taken (was also eating and drinking when RN walked in). Wet mount reviewed, no tx per standing orders. Pt accepted business cards. Provider orders completed.
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
## 2020-05-23 ENCOUNTER — Encounter: Payer: Self-pay | Admitting: Family Medicine

## 2020-05-23 LAB — HM HEPATITIS C SCREENING LAB: HM Hepatitis Screen: NEGATIVE

## 2020-05-23 LAB — HM HIV SCREENING LAB: HM HIV Screening: NEGATIVE

## 2020-05-25 ENCOUNTER — Telehealth: Payer: Self-pay | Admitting: Family Medicine

## 2020-05-25 NOTE — Telephone Encounter (Signed)
Phone call to pt. No answer, received a combination of alternating ringing and busy sounds. Unable to leave message.

## 2020-05-25 NOTE — Telephone Encounter (Signed)
RN returned patient's phone call. Busy signals only, called both numbers on file. Unable to leave message.  Deri Fuelling, RN

## 2020-05-28 NOTE — Telephone Encounter (Signed)
Phone call to pt. Only received busy signal, unable to leave message.

## 2021-04-09 DIAGNOSIS — Z1231 Encounter for screening mammogram for malignant neoplasm of breast: Principal | ICD-10-CM

## 2021-04-10 ENCOUNTER — Ambulatory Visit: Admit: 2021-04-10 | Discharge: 2021-04-11 | Payer: PRIVATE HEALTH INSURANCE

## 2021-06-14 ENCOUNTER — Ambulatory Visit: Payer: Medicaid Other

## 2021-06-17 ENCOUNTER — Ambulatory Visit: Payer: Medicaid Other

## 2021-06-18 ENCOUNTER — Ambulatory Visit: Payer: Medicaid Other | Admitting: Nurse Practitioner

## 2021-06-18 ENCOUNTER — Encounter: Payer: Self-pay | Admitting: Nurse Practitioner

## 2021-06-18 ENCOUNTER — Other Ambulatory Visit: Payer: Self-pay

## 2021-06-18 DIAGNOSIS — Z113 Encounter for screening for infections with a predominantly sexual mode of transmission: Secondary | ICD-10-CM | POA: Diagnosis not present

## 2021-06-18 LAB — WET PREP FOR TRICH, YEAST, CLUE
Trichomonas Exam: NEGATIVE
Yeast Exam: NEGATIVE

## 2021-06-18 NOTE — Progress Notes (Signed)
Pt here for STD check.  Wet mount results reviewed, no treatment required. Windle Guard, RN
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
## 2021-07-10 DIAGNOSIS — Z1231 Encounter for screening mammogram for malignant neoplasm of breast: Principal | ICD-10-CM

## 2021-12-03 ENCOUNTER — Emergency Department
Admit: 2021-12-03 | Discharge: 2021-12-04 | Disposition: A | Discharge status: Home / Self Care | Payer: PRIVATE HEALTH INSURANCE

## 2021-12-03 ENCOUNTER — Ambulatory Visit
Admit: 2021-12-03 | Discharge: 2021-12-04 | Disposition: A | Discharge status: Home / Self Care | Payer: PRIVATE HEALTH INSURANCE

## 2021-12-03 ENCOUNTER — Ambulatory Visit
Admission: EM | Admit: 2021-12-03 | Discharge: 2021-12-03 | Payer: Medicaid Other | Attending: Physician Assistant | Admitting: Physician Assistant

## 2021-12-03 DIAGNOSIS — I169 Hypertensive crisis, unspecified: Secondary | ICD-10-CM | POA: Diagnosis present

## 2021-12-03 DIAGNOSIS — R1084 Generalized abdominal pain: Secondary | ICD-10-CM | POA: Diagnosis present

## 2021-12-03 LAB — URINALYSIS, MICROSCOPIC (REFLEX)

## 2021-12-03 LAB — URINALYSIS, ROUTINE W REFLEX MICROSCOPIC
Glucose, UA: NEGATIVE mg/dL
Ketones, ur: NEGATIVE mg/dL
Leukocytes,Ua: NEGATIVE
Nitrite: NEGATIVE
Protein, ur: 30 mg/dL — AB
Specific Gravity, Urine: >1.03 — ABNORMAL HIGH (ref 1.005–1.030)
pH: 5.5 (ref 5.0–8.0)

## 2021-12-03 NOTE — ED Triage Notes (Signed)
Pt c/o high blood pressure. Pt states that she has been off of her medication for "a long time". ? ?Pt is having abdominal pain. Pt states that it is tight under her ribs and it is sore along the front of her stomach. Pt states that she has a Knot along her left side x75month  ? ? Pt is worried about a possible UTI due to dark yellow color of urine.  ? ?Pt used to be on Hydralazine '10mg'$  for blood pressure.  ?

## 2021-12-03 NOTE — ED Notes (Signed)
Patient is being discharged from the Urgent Care and sent to the Emergency Department via POV . Per Jeanett Schlein, NP, patient is in need of higher level of care due to elevated BP. Patient is aware and verbalizes understanding of plan of care.  ?Vitals:  ? 12/03/21 1909  ?BP: (!) 259/123  ?Pulse: (!) 114  ?Resp: 18  ?Temp: 98 ?F (36.7 ?C)  ?SpO2: 98%  ?  ?
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
## 2021-12-03 NOTE — Discharge Instructions (Addendum)
Go to Er now ?

## 2021-12-11 ENCOUNTER — Ambulatory Visit: Admit: 2021-12-11 | Discharge: 2021-12-13 | Payer: PRIVATE HEALTH INSURANCE

## 2021-12-11 ENCOUNTER — Encounter
Admit: 2021-12-11 | Discharge: 2021-12-13 | Payer: PRIVATE HEALTH INSURANCE | Attending: Anesthesiology | Primary: Anesthesiology

## 2021-12-13 MED ORDER — OXYCODONE 5 MG TABLET
ORAL_TABLET | ORAL | 0 refills | 5 days | Status: CP | PRN
Start: 2021-12-13 — End: 2021-12-18
  Filled 2021-12-13: qty 30, 5d supply, fill #0

## 2021-12-13 MED ORDER — NITROFURANTOIN MONOHYDRATE/MACROCRYSTALS 100 MG CAPSULE
ORAL_CAPSULE | Freq: Two times a day (BID) | ORAL | 0 refills | 5 days | Status: CP
Start: 2021-12-13 — End: 2021-12-18
  Filled 2021-12-13: qty 9, 5d supply, fill #0

## 2021-12-13 MED ORDER — ONDANSETRON 4 MG DISINTEGRATING TABLET
ORAL_TABLET | Freq: Three times a day (TID) | ORAL | 0 refills | 7 days | Status: CP | PRN
Start: 2021-12-13 — End: 2021-12-20
  Filled 2021-12-13: qty 21, 7d supply, fill #0

## 2021-12-20 ENCOUNTER — Ambulatory Visit
Admit: 2021-12-20 | Discharge: 2021-12-21 | Payer: PRIVATE HEALTH INSURANCE | Attending: Hematology & Oncology | Primary: Hematology & Oncology

## 2021-12-20 DIAGNOSIS — C259 Malignant neoplasm of pancreas, unspecified: Principal | ICD-10-CM

## 2021-12-20 MED ORDER — DEXAMETHASONE 4 MG TABLET
ORAL_TABLET | Freq: Every day | ORAL | 5 refills | 6 days | Status: CP
Start: 2021-12-20 — End: ?

## 2021-12-20 MED ORDER — POLYETHYLENE GLYCOL 3350 17 GRAM/DOSE ORAL POWDER
Freq: Every day | ORAL | 11 refills | 15 days | Status: CP
Start: 2021-12-20 — End: ?

## 2021-12-20 MED ORDER — PROCHLORPERAZINE MALEATE 10 MG TABLET
ORAL_TABLET | Freq: Four times a day (QID) | ORAL | 2 refills | 8 days | Status: CP | PRN
Start: 2021-12-20 — End: ?

## 2021-12-20 MED ORDER — ONDANSETRON HCL 8 MG TABLET
ORAL_TABLET | Freq: Three times a day (TID) | ORAL | 2 refills | 10 days | Status: CP | PRN
Start: 2021-12-20 — End: ?

## 2021-12-20 MED ORDER — OXYCODONE 5 MG TABLET
ORAL_TABLET | ORAL | 0 refills | 15 days | Status: CP | PRN
Start: 2021-12-20 — End: 2022-01-04

## 2021-12-20 MED ORDER — SENNOSIDES 8.6 MG TABLET
ORAL_TABLET | Freq: Two times a day (BID) | ORAL | 2 refills | 30 days | Status: CP
Start: 2021-12-20 — End: 2022-03-20

## 2021-12-20 MED ORDER — LOPERAMIDE 2 MG CAPSULE
ORAL_CAPSULE | 11 refills | 0 days | Status: CP
Start: 2021-12-20 — End: ?

## 2021-12-21 ENCOUNTER — Ambulatory Visit
Admit: 2021-12-21 | Discharge: 2021-12-23 | Disposition: A | Discharge status: Home / Self Care | Payer: PRIVATE HEALTH INSURANCE

## 2021-12-23 DIAGNOSIS — C259 Malignant neoplasm of pancreas, unspecified: Principal | ICD-10-CM

## 2021-12-23 MED ORDER — BISACODYL 10 MG RECTAL SUPPOSITORY
Freq: Every day | RECTAL | 0 refills | 12 days | Status: CP | PRN
Start: 2021-12-23 — End: 2022-01-22
  Filled 2021-12-23: qty 12, 12d supply, fill #0

## 2021-12-23 NOTE — Unmapped (Signed)
[facility] Biopsy Request Information Sheet     Referring Provider:  Geni Bers  Date of Review:  12/23/2021    Reviewing Provider:  Shaaron Adler, MD     Requested Biopsy Site:  Liver     Reason for Request:  Liver mass    Past Medical History:    Past Medical History:   Diagnosis Date    Allergic rhinitis 12/30/2007    Hypertension     Murmur, heart     Sleep related leg cramps 11/03/2011    Tobacco use disorder 11/03/2011       Imaging reviewed:  I reviewed all pertinent diagnostic studies, including:  CT dated on 02-Jan-2022.    Recommended Imaging Modality to Perform Biopsy:  CT    Comments for Biopsy:  with moderate sedation.

## 2021-12-24 MED ORDER — LEVOFLOXACIN 750 MG TABLET
ORAL_TABLET | Freq: Every day | ORAL | 0 refills | 3 days | Status: CP
Start: 2021-12-24 — End: ?
  Filled 2021-12-23: qty 3, 3d supply, fill #0

## 2021-12-26 ENCOUNTER — Ambulatory Visit: Admit: 2021-12-26 | Discharge: 2021-12-26 | Payer: PRIVATE HEALTH INSURANCE

## 2021-12-26 ENCOUNTER — Institutional Professional Consult (permissible substitution): Admit: 2021-12-26 | Discharge: 2021-12-26 | Payer: PRIVATE HEALTH INSURANCE

## 2021-12-26 DIAGNOSIS — C259 Malignant neoplasm of pancreas, unspecified: Principal | ICD-10-CM

## 2021-12-26 DIAGNOSIS — C787 Secondary malignant neoplasm of liver and intrahepatic bile duct: Principal | ICD-10-CM

## 2021-12-26 DIAGNOSIS — Z09 Encounter for follow-up examination after completed treatment for conditions other than malignant neoplasm: Principal | ICD-10-CM

## 2022-01-10 ENCOUNTER — Ambulatory Visit: Admit: 2022-01-10 | Discharge: 2022-01-11 | Payer: PRIVATE HEALTH INSURANCE

## 2022-01-10 ENCOUNTER — Ambulatory Visit
Admit: 2022-01-10 | Discharge: 2022-01-11 | Payer: PRIVATE HEALTH INSURANCE | Attending: Registered" | Primary: Registered"

## 2022-01-10 ENCOUNTER — Other Ambulatory Visit: Admit: 2022-01-10 | Discharge: 2022-01-11 | Payer: PRIVATE HEALTH INSURANCE

## 2022-01-10 ENCOUNTER — Ambulatory Visit
Admit: 2022-01-10 | Discharge: 2022-01-11 | Payer: PRIVATE HEALTH INSURANCE | Attending: Hematology & Oncology | Primary: Hematology & Oncology

## 2022-01-10 DIAGNOSIS — C259 Malignant neoplasm of pancreas, unspecified: Principal | ICD-10-CM

## 2022-01-10 DIAGNOSIS — T451X5A Adverse effect of antineoplastic and immunosuppressive drugs, initial encounter: Principal | ICD-10-CM

## 2022-01-10 DIAGNOSIS — D701 Agranulocytosis secondary to cancer chemotherapy: Principal | ICD-10-CM

## 2022-01-10 MED ORDER — PEGFILGRASTIM-BMEZ 6 MG/0.6 ML SUBCUTANEOUS SYRINGE
5 refills | 0 days | Status: CP
Start: 2022-01-10 — End: ?

## 2022-01-10 MED ORDER — OXYCODONE 5 MG TABLET
ORAL_TABLET | ORAL | 0 refills | 15 days | Status: CP | PRN
Start: 2022-01-10 — End: 2022-01-25

## 2022-01-12 ENCOUNTER — Ambulatory Visit: Admit: 2022-01-12 | Discharge: 2022-01-13 | Payer: PRIVATE HEALTH INSURANCE

## 2022-01-13 MED ORDER — OXYCODONE 5 MG TABLET
ORAL_TABLET | ORAL | 0 refills | 15 days | Status: CP | PRN
Start: 2022-01-13 — End: 2022-01-28

## 2022-01-14 ENCOUNTER — Ambulatory Visit: Admit: 2022-01-14 | Discharge: 2022-01-15 | Payer: PRIVATE HEALTH INSURANCE

## 2022-01-14 DIAGNOSIS — T451X5A Adverse effect of antineoplastic and immunosuppressive drugs, initial encounter: Principal | ICD-10-CM

## 2022-01-14 DIAGNOSIS — D701 Agranulocytosis secondary to cancer chemotherapy: Principal | ICD-10-CM

## 2022-01-14 DIAGNOSIS — C259 Malignant neoplasm of pancreas, unspecified: Principal | ICD-10-CM

## 2022-01-16 DIAGNOSIS — D701 Agranulocytosis secondary to cancer chemotherapy: Principal | ICD-10-CM

## 2022-01-16 DIAGNOSIS — C259 Malignant neoplasm of pancreas, unspecified: Principal | ICD-10-CM

## 2022-01-16 DIAGNOSIS — T451X5A Adverse effect of antineoplastic and immunosuppressive drugs, initial encounter: Principal | ICD-10-CM

## 2022-01-16 MED ORDER — PEGFILGRASTIM-CBQV 6 MG/0.6 ML SUBCUTANEOUS SYRINGE
5 refills | 0 days | Status: CP
Start: 2022-01-16 — End: ?
  Filled 2022-01-22: qty 1.2, 28d supply, fill #0

## 2022-01-16 NOTE — Unmapped (Signed)
Norma Green requests a call back from [name]. She is feeling nauseous and has questions about medications.    Thanks  Yehuda Mao

## 2022-01-16 NOTE — Unmapped (Signed)
RN Navigator returned call to pt. Pt states that she does not have prochlorperazine at home. RN Navigator contacted [employer] and confirmed that rx was received  and picked up by pt late April and refill remaining. Pt will call if she has any issues finding the medication

## 2022-01-16 NOTE — Unmapped (Signed)
RN Navigator contacted pt to check on symptoms. Pt reports that she still has low grade nausea. Vomited x 1 yesterday. Takes zofran with some relief but nausea returns after about 1 hr. Encouraged pt to alternate with zofran and compazine.    PT reports that now having a little bit of diarrhea. Says that abd pain is relieved.     PT eating small meals. Had eggs and grits this morning. Drinking fluids. States that symptoms are slowly improving.    Encouraged pt to call with any questions or concerns.

## 2022-01-16 NOTE — Unmapped (Signed)
Routed prescription for Udenyca to [facility] to continue pharmacy benefits investigation process for self-administration at home (notification received from MAP team that Ziextenzo is not preferred by patient's insurance plan).    Care coordination: 5 minutes

## 2022-01-16 NOTE — Unmapped (Signed)
[facility] Specialty Medication Onboarding    Specialty Medication: [name]  Prior Authorization: Not Required   Financial Assistance: No - copay  <$25  Final Copay/Day Supply: $4 / 28    Insurance Restrictions: None     Notes to Pharmacist:     The triage team has completed the benefits investigation and has determined that the patient is able to fill this medication at [facility]. Please contact the patient to complete the onboarding or follow up with the prescribing physician as needed.

## 2022-01-17 NOTE — Unmapped (Signed)
RN Navigator contacted pt to check on status. Tried both numbers listed in Epic. No answer. Unable to leave VM. Mailbox full.

## 2022-01-17 NOTE — Unmapped (Signed)
[facility]   Patient Onboarding/Medication Counseling    Norma Green is a 61 y.o. female with Pancreatic adenocarcinoma who I am counseling today on initiation of therapy.  I am speaking to the patient's family member, husband .    Was a Nurse, learning disability used for this call? No    Verified patient's date of birth / HIPAA.    Specialty medication(s) to be sent: Hematology/Oncology: [name]    Non-specialty medications/supplies to be sent: none    Medications not needed at this time: none     Udenyca (pegfilgrastim)    Medication & Administration     Dosage: Inject the contents of 1 syringe (6 mg) under the skin once on day 4 each chemotherapy cycle (24 hours after the completion of chemotherapy).    Administration: Inject under the skin of the thigh, abdomen or upper arm. Rotate sites with each injection.  Injection instructions   Take 1 syringe out of the refrigerator and allow to stand at room temperature for at least 30 minutes  Wash hands and remove syringe from the tray  Check the syringe for the following   Expiration date  Medication is clear and colorless and free from particles  It is normal to see 1 or more air bubbles in the syringe and removal of the air is not necessary  It appears unused or damaged and the needle cap is securely attached  Choose your injection site (abdomen but not within 2 inches of navel, thigh or if someone else is injecting may also use upper arms or upper outer area of buttocks)  Clean the injection site with an alcohol wipe using a circular motion and allow to air dry completely  Pull the needle cap straight off and discard  Hold the syringe like a dart (just under the finger grips) with your thumb and index finger.  Pinch the skin and insert the needle at a 45-90 degree angle (keep skin pinched while injecting)  Push the plunger head down to deliver dose using a slow and constant pressure until the plunger head reaches the bottom and hold syringe in place for 5 seconds  While the needle is still inserted, slowly move your thumb back, allowing the plunger to rise.  This will release the needle safety guard to safely cover the needle. Then remove the syringe from the injection site.  If there is blood at the injection site gently press a cotton ball or gauze to the site. Do not rub the injection site.  Dispose of the used prefilled syringe into a sharps container or hard plastic bottle.    Adherence / Missed Dose Instructions: Please notify provider if more than 2 days late injecting    Goals of Therapy     Stimulate the growth of neutrophils (a type of white blood important to fight against infection) used after chemotherapy.    Side Effects & Monitoring Parameters   Injection site irritation  Pain/aching in the bones, arms and legs    The following side effects should be reported to the provider:  Signs of an allergic reaction    Contraindications, Warnings, & Precautions     Hypersensitivity  Hypersensitivity to latex    Drug/Food Interactions     Medication list reviewed in Epic. The patient was instructed to inform the care team before taking any new medications or supplements. No drug interactions identified.     Storage, Handling Precautions, & Disposal     Udenyca should be stored in  the refrigerator.   Avoid freezing syringe but if frozen may be thawed one time  Throw away Udenyca syringe that has been left at room temperature for more than 48 hours or frozen more than 1 time  Do not shake the prefilled syringe  Keep out of the reach of children  Place used devices into a sharps container for disposal (which we can supply along with band-aids and alcohol pads) or hard plastic container     Current Medications (including OTC/herbals), Comorbidities and Allergies     Current Outpatient Medications   Medication Sig Dispense Refill    amLODIPine (NORVASC) 10 MG tablet TAKE ONE TABLET BY MOUTH NIGHTLY 30 tablet 0    aspirin (ECOTRIN) 81 MG tablet Take 1 tablet (81 mg total) by mouth daily.      bisacodyL (DULCOLAX) 10 mg suppository Unwrap and insert 1 suppository (10 mg total) into the rectum daily as needed. 12 suppository 0    dexAMETHasone (DECADRON) 4 MG tablet Take 2 tablets (8 mg total) by mouth daily. Take only on days 2, 3, and 4 of each 14-day chemotherapy cycle. 12 tablet 5    heparin, porcine, PF, 100 unit/mL Syrg Infuse 5 mL into a venous catheter every fourteen (14) days. For use while patient is on fluorouracil (5-FU) home infusion.   Flush IV catheter with heparin 5 mL as directed after final saline flush per SASH method and as needed for line maintenance. 4 each PRN    hydrochlorothiazide (HYDRODIURIL) 25 MG tablet TAKE ONE TABLET BY MOUTH ONCE DAILY 30 tablet 0    levoFLOXacin (LEVAQUIN) 750 MG tablet Take 1 tablet (750 mg total) by mouth daily. 3 tablet 0    lisinopril (PRINIVIL,ZESTRIL) 40 MG tablet Take 1 tablet (40 mg total) by mouth daily. 30 tablet 11    loperamide (IMODIUM) 2 mg capsule If having diarrhea, take 2 capsules (4 mg) after the first loose stool, then 1 capsule every 2 hours until diarrhea free for 12 hours. 60 capsule 11    metoprolol succinate (TOPROL XL) 100 MG 24 hr tablet Take 1 tablet (100 mg total) by mouth daily. 30 tablet 11    nicotine (NICODERM CQ) 7 mg/24 hr patch Place 1 patch on the skin daily. 28 patch 1    ondansetron (ZOFRAN) 8 MG tablet Take 1 tablet (8 mg total) by mouth every eight (8) hours as needed for nausea. 30 tablet 2    oxyCODONE (ROXICODONE) 5 MG immediate release tablet Take 1 tablet (5 mg total) by mouth every four (4) hours as needed for up to 15 days. 90 tablet 0    pegfilgrastim-cbqv (UDENYCA) 6 mg/0.6 mL injection Inject the contents of 1 syringe (6 mg) under the skin once on day 4 each chemotherapy cycle (24 hours after the completion of chemotherapy). 1.2 mL 5    polyethylene glycol (GLYCOLAX) 17 gram/dose powder Take 17 g by mouth daily. 255 g 11    prochlorperazine (COMPAZINE) 10 MG tablet Take 1 tablet (10 mg total) by mouth every six (6) hours as needed (nausea). 30 tablet 2    senna (SENOKOT) 8.6 mg tablet Take 1 tablet by mouth Two (2) times a day. 60 tablet 2    sertraline (ZOLOFT) 50 MG tablet Take 1 tablet (50 mg total) by mouth daily. 30 tablet 11    sodium chloride (NS) 0.9 % injection Infuse 10 mL into a venous catheter every fourteen (14) days. For use while patient is on fluorouracil (5-FU) home  infusion.   Flush IV catheter with normal saline 10 mL prior to and after infusion followed by heparin flush per SASH method and as needed for line maintenance. 6 each PRN     No current facility-administered medications for this visit.       Allergies   Allergen Reactions    Metronidazole Nausea And Vomiting       Patient Active Problem List   Diagnosis    Allergic rhinitis    Hypertension, benign    Tobacco use disorder    Headache    Fibroid uterus    Menopausal symptoms    Preventative health care    Leg cramps    Anxiety    Epigastric pain    Pancreatic mass    Pancreatitis    Pancreatic adenocarcinoma (CMS-HCC)    Cholecystitis    Constipation due to opioid therapy    Malignant neoplasm metastatic to liver (CMS-HCC)       Reviewed and up to date in Epic.    Appropriateness of Therapy     Acute infections noted within Epic:  No active infections  Patient reported infection: None    Is medication and dose appropriate based on diagnosis and infection status? Yes    Prescription has been clinically reviewed: Yes      Baseline Quality of Life Assessment      How many days over the past month did your pancreatic adenocarcinoma  keep you from your normal activities? For example, brushing your teeth or getting up in the morning. 0    Financial Information     Medication Assistance provided: None Required    Anticipated copay of $4 / 28 days reviewed with patient. Verified delivery address.    Delivery Information     Scheduled delivery date: 01/22/22    Expected start date: 01/27/22 (C3D4 Dose due 01/27/22)    Medication will be delivered via Same Day Courier to the prescription address in [facility].  This shipment will not require a signature.      Explained the services we provide at [facility] and that each month we would call to set up refills.  Stressed importance of returning phone calls so that we could ensure they receive their medications in time each month.  Informed patient that we should be setting up refills 7-10 days prior to when they will run out of medication.  A pharmacist will reach out to perform a clinical assessment periodically.  Informed patient that a welcome packet, containing information about our pharmacy and other support services, a Notice of Privacy Practices, and a drug information handout will be sent.      The patient or caregiver noted above participated in the development of this care plan and knows that they can request review of or adjustments to the care plan at any time.      Patient or caregiver verbalized understanding of the above information as well as how to contact the pharmacy at [phone] option 4 with any questions/concerns.  The pharmacy is open Monday through Friday 8:30am-4:30pm.  A pharmacist is available 24/7 via pager to answer any clinical questions they may have.    Patient Specific Needs     Does the patient have any physical, cognitive, or cultural barriers? No    Does the patient have adequate living arrangements? (i.e. the ability to store and take their medication appropriately) Yes    Did you identify any home environmental safety or security hazards?  No    Patient prefers to have medications discussed with   patient and husband      Is the patient or caregiver able to read and understand education materials at a high school level or above? Yes    Patient's primary language is  English     Is the patient high risk? No    SOCIAL DETERMINANTS OF HEALTH     At the [facility], we have learned that life circumstances - like trouble affording food, housing, utilities, or transportation can affect the health of many of our patients.   That is why we wanted to ask: are you currently experiencing any life circumstances that are negatively impacting your health and/or quality of life? No    Social Determinants of Psychologist, prison and probation services Strain: Medium Risk    Difficulty of Paying Living Expenses: Somewhat hard   Internet Connectivity: No Internet connectivity concern identified    Do you have access to internet services: Yes    How do you connect to the internet: Personal Device at home    Is your internet connection strong enough for you to watch video on your device without major problems?: Yes    Do you have enough data to get through the month?: Yes    Does at least one of the devices have a camera that you can use for video chat?: Yes   Food Insecurity: Food Insecurity Present    Worried About Running Out of Food in the Last Year: Never true    Ran Out of Food in the Last Year: Often true   Tobacco Use: High Risk    Smoking Tobacco Use: Every Day    Smokeless Tobacco Use: Unknown    Passive Exposure: Not on file   Housing/Utilities: High Risk    Within the past 12 months, have you ever stayed: outside, in a car, in a tent, in an overnight shelter, or temporarily in someone else's home (i.e. couch-surfing)?: No    Are you worried about losing your housing?: No    Within the past 12 months, have you been unable to get utilities (heat, electricity) when it was really needed?: Yes   Alcohol Use: Not on file   Transportation Needs: No Transportation Needs    Lack of Transportation (Medical): No    Lack of Transportation (Non-Medical): No   Substance Use: Not on file   Health Literacy: Low Risk     : Never   Physical Activity: Not on file   Interpersonal Safety: Not on file   Stress: Not on file   Intimate Partner Violence: Not on file   Depression: Not on file   Social Connections: Not on file       Would you be willing to receive help with any of the needs that you have identified today? Not applicable       Diany Formosa [facility] Specialty Pharmacist

## 2022-01-22 NOTE — Unmapped (Signed)
Patient transitioned to home growth factor with counseling provided by [facility] team. Day 4 growth factor for clinic administration removed from [facility] treatment plan given this plan change.    Care coordination: 3 minutes

## 2022-01-23 NOTE — Unmapped (Unsigned)
[facility] MEDICAL ONCOLOGY     REFERRING PROVIDER:   Roni Bread, Md  [address]  [id number] Physicians Office [place],  [place] [address]    PRIMARY CARE PROVIDER:  Ollen Barges, MD  [address]  [place] [address]    CONSULTING PROVIDERS  [facility] Biliary Team    ____________________________________________________________________    CANCER HISTORY  Diagnosis: Pancreatic Adenocarcinoma, imaging concerning for metastatic disease to liver  1. 12/12/21: Presented with abdominal pain, weight loss found to have CBD obstruction with both pancreatic head and tail mass along with liver lesion. EUS/ERCP with CBD stent placement.  Biopsy of pancreatic tail mass with adenocarcinoma. Concern for metastatic disease with 1 cm liver lesion.  Also, local extension to left adrenal.   2. 5/4 start FOLFIRINOX (Cy 1 FOLFOX)    Molecular:  Somatic: KRAS, TP53, CCND3, TFEB, VEGFA, TMP 6.3 m/MB  Germline: negative   ____________________________________________________________________    ASSESSMENT  1. Pancreatic adenocarcinoma with metastatic disease to liver  2. Cancer associated abdominal pain - improving  3. Opioid Induced Constipation   4. Weight Loss  5. Chemotherapy induced neutropenia - adding GCSF with C2    RECOMMENDATIONS  1. mFOLIRINOX (add irinotecan for C2)  -GCSF due to neutropenia, ANC 1.4  2. Refill Oxycodone 5mg  every 4 hours prn  3. Continue Miralax and Senna  4. RTC 2 weeks for labs, visit and treatment  5. Repeat scans to assess treatment response prior to C5 (8 weeks after starting treatment)    DISCUSSION      Decision-making today was high complexity on the basis of discussion of cancer, and weighing use of high risk chemotherapy, toxicity and complications/comorbidity of metastatic pancreatic cancer.        ______________________________________________________________________  HISTORY     Norma Green is seen today at the [facility] Medical Oncology Clinic for ongoing management regarding pancreatic adenocarcinoma.           She tolerated cycle 1 of FOLFOX well.  About 3 days of mild nausea that was well controlled without vomiting.  Dysgeusia impacting intake for the first week.  Appetite is improved over the last week and she has been eating frequent small meals cold sensitivity and numbness in fingers and toes present for 7 to 10 days.  Now completely resolved.  Moving bowels regularly with use of MiraLAX and senna.  Only needed oxycodone 1-2 times a day at this point.  Abdominal pain improving.    MEDICAL HISTORY  1. HTN  2. Anxiety    Allergies   Allergen Reactions    Metronidazole Nausea And Vomiting     Medications reviewed in the EMR    FAMILY HISTORY  Family history  Number of children: 2  History of cancer in children (yes/no; if yes, what type AND age of diagnosis): no  Number of siblings: 6 (5 living)  History of cancer in siblings or parents (yes/no; if yes, provide relation, type of cancer, AND age of diagnosis): sister with breast cancer (early stage)  History of cancer in maternal aunts/uncles/grandparents (yes/no; if yes, provide relation, type of cancer, AND age of diagnosis): MGF prostate, uncle with colon  History of cancer in paternal aunts/uncles/grandparents (yes/no; if yes, provide relation, type of cancer, AND age of diagnosis): Aunt with stomach cancer    SOCIAL HISTORY  Lives with family including husband. Smoker. No alcohol or drug use.  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  REVIEW OF SYSTEMS  The remainder of a comprehensive 10 systems review is negative.    PHYSICAL EXAM  There were no vitals taken for this visit.   GENERAL: well developed, well nourished, in no distress  PSYCH: full and appropriate range of affect with good insight and judgement  HEENT: NCAT, pupils equal, sclerae anicteric  EXT: warm and well perfused, no edema  SKIN: No rashes    OBJECTIVE DATA  LABS  Labs reviewed in emr  ANC 1.4    RADIOLOGY RESULTS   No imaging today

## 2022-01-24 ENCOUNTER — Ambulatory Visit: Admit: 2022-01-24 | Discharge: 2022-01-25 | Payer: PRIVATE HEALTH INSURANCE

## 2022-01-24 ENCOUNTER — Other Ambulatory Visit: Admit: 2022-01-24 | Discharge: 2022-01-25 | Payer: PRIVATE HEALTH INSURANCE

## 2022-01-24 ENCOUNTER — Ambulatory Visit
Admit: 2022-01-24 | Discharge: 2022-01-25 | Payer: PRIVATE HEALTH INSURANCE | Attending: Registered" | Primary: Registered"

## 2022-01-24 DIAGNOSIS — C259 Malignant neoplasm of pancreas, unspecified: Principal | ICD-10-CM

## 2022-01-24 LAB — CBC W/ AUTO DIFF
BASOPHILS ABSOLUTE COUNT: 0.1 10*9/L (ref 0.0–0.1)
BASOPHILS RELATIVE PERCENT: 0.7 %
EOSINOPHILS ABSOLUTE COUNT: 0.2 10*9/L (ref 0.0–0.5)
EOSINOPHILS RELATIVE PERCENT: 1.7 %
HEMATOCRIT: 32.6 % — ABNORMAL LOW (ref 34.0–44.0)
HEMOGLOBIN: 11.2 g/dL — ABNORMAL LOW (ref 11.3–14.9)
LYMPHOCYTES ABSOLUTE COUNT: 3.2 10*9/L (ref 1.1–3.6)
LYMPHOCYTES RELATIVE PERCENT: 29.7 %
MEAN CORPUSCULAR HEMOGLOBIN CONC: 34.3 g/dL (ref 32.0–36.0)
MEAN CORPUSCULAR HEMOGLOBIN: 27.8 pg (ref 25.9–32.4)
MEAN CORPUSCULAR VOLUME: 81 fL (ref 77.6–95.7)
MEAN PLATELET VOLUME: 7.8 fL (ref 6.8–10.7)
MONOCYTES ABSOLUTE COUNT: 1 10*9/L — ABNORMAL HIGH (ref 0.3–0.8)
MONOCYTES RELATIVE PERCENT: 8.8 %
NEUTROPHILS ABSOLUTE COUNT: 6.4 10*9/L (ref 1.8–7.8)
NEUTROPHILS RELATIVE PERCENT: 59.1 %
PLATELET COUNT: 145 10*9/L — ABNORMAL LOW (ref 150–450)
RED BLOOD CELL COUNT: 4.02 10*12/L (ref 3.95–5.13)
RED CELL DISTRIBUTION WIDTH: 13 % (ref 12.2–15.2)
WBC ADJUSTED: 10.8 10*9/L (ref 3.6–11.2)

## 2022-01-24 LAB — SLIDE REVIEW

## 2022-01-24 LAB — CANCER ANTIGEN 19-9: CA 19-9: 511.21 U/mL — ABNORMAL HIGH (ref 0–35)

## 2022-01-24 LAB — MAGNESIUM: MAGNESIUM: 1.7 mg/dL (ref 1.6–2.6)

## 2022-01-24 LAB — COMPREHENSIVE METABOLIC PANEL
ALBUMIN: 3.1 g/dL — ABNORMAL LOW (ref 3.4–5.0)
ALKALINE PHOSPHATASE: 141 U/L — ABNORMAL HIGH (ref 46–116)
ALT (SGPT): 23 U/L (ref 10–49)
ANION GAP: 5 mmol/L (ref 5–14)
AST (SGOT): 20 U/L (ref <=34)
BILIRUBIN TOTAL: 0.3 mg/dL (ref 0.3–1.2)
BLOOD UREA NITROGEN: 14 mg/dL (ref 9–23)
BUN / CREAT RATIO: 20
CALCIUM: 8.8 mg/dL (ref 8.7–10.4)
CHLORIDE: 106 mmol/L (ref 98–107)
CO2: 31 mmol/L (ref 20.0–31.0)
CREATININE: 0.71 mg/dL
EGFR CKD-EPI (2021) FEMALE: >90 mL/min/{1.73_m2} (ref >=60)
GLUCOSE RANDOM: 91 mg/dL (ref 70–179)
POTASSIUM: 3.5 mmol/L (ref 3.5–5.1)
PROTEIN TOTAL: 6.2 g/dL (ref 5.7–8.2)
SODIUM: 142 mmol/L (ref 135–145)

## 2022-01-24 MED ADMIN — ondansetron (ZOFRAN) tablet 24 mg: 24 mg | ORAL | @ 18:00:00 | Stop: 2022-01-24

## 2022-01-24 MED ADMIN — dextrose 5 % infusion: 100 mL/h | INTRAVENOUS | @ 19:00:00

## 2022-01-24 MED ADMIN — prochlorperazine (COMPAZINE) injection 10 mg: 10 mg | INTRAVENOUS | @ 22:00:00

## 2022-01-24 MED ADMIN — leucovorin 664 mg in dextrose 5 % 50 mL IVPB: 400 mg/m2 | INTRAVENOUS | @ 21:00:00 | Stop: 2022-01-24

## 2022-01-24 MED ADMIN — irinotecan (CAMPTOSAR) 249 mg in dextrose 5 % 500 mL IVPB: 150 mg/m2 | INTRAVENOUS | @ 21:00:00 | Stop: 2022-01-24

## 2022-01-24 MED ADMIN — fluorouracil (ADRUCIL) 4,000 mg in sodium chloride (NS) 0.9 % 46-hr infusion CADD: 2400 mg/m2 | INTRAVENOUS | @ 23:00:00

## 2022-01-24 MED ADMIN — atropine injection: .4 mg | INTRAVENOUS | @ 21:00:00

## 2022-01-24 MED ADMIN — oxaliplatin (ELOXATIN) 141.1 mg in dextrose 5 % 250 mL IVPB: 85 mg/m2 | INTRAVENOUS | @ 19:00:00 | Stop: 2022-01-24

## 2022-01-24 MED ADMIN — dexAMETHasone (DECADRON) tablet 20 mg: 20 mg | ORAL | @ 18:00:00 | Stop: 2022-01-24

## 2022-01-24 NOTE — Unmapped (Signed)
PLAN FROM TODAY  For nausea:  Starting Sat (day after chemo):  Dexamethasone 4mg  tab - 2 tablets in the morning for 3 days (Sat, Sun, Mon)    Ondanestron (Zofran) 8mg  every 8 hours as needed  Prochlorperzine (Compazine) 10mg  every 6 hours as needed    RECENT RESULTS    Lab on 01/24/2022   Component Date Value Ref Range Status    Sodium 01/24/2022 142  135 - 145 mmol/L Final    Potassium 01/24/2022 3.5  3.5 - 5.1 mmol/L Final    Chloride 01/24/2022 106  98 - 107 mmol/L Final    CO2 01/24/2022 31.0  20.0 - 31.0 mmol/L Final    Anion Gap 01/24/2022 5  5 - 14 mmol/L Final    BUN 01/24/2022 14  9 - 23 mg/dL Final    Creatinine 69/62/9528 0.71  0.60 - 0.80 mg/dL Final    BUN/Creatinine Ratio 01/24/2022 20   Final    eGFR CKD-EPI (2021) Female 01/24/2022 >90  >=60 mL/min/1.36m2 Final    eGFR calculated with CKD-EPI 2021 equation in accordance with [employer] and [employer] of Nephrology Task Force recommendations.    Glucose 01/24/2022 91  70 - 179 mg/dL Final    Calcium 41/32/4401 8.8  8.7 - 10.4 mg/dL Final    Albumin 02/72/5366 3.1 (L)  3.4 - 5.0 g/dL Final    Total Protein 01/24/2022 6.2  5.7 - 8.2 g/dL Final    Total Bilirubin 01/24/2022 0.3  0.3 - 1.2 mg/dL Final    AST 44/10/4740 20  <=34 U/L Final    ALT 01/24/2022 23  10 - 49 U/L Final    Alkaline Phosphatase 01/24/2022 141 (H)  46 - 116 U/L Final    Magnesium 01/24/2022 1.7  1.6 - 2.6 mg/dL Final    WBC 59/56/3875 10.8  3.6 - 11.2 10*9/L Final    RBC 01/24/2022 4.02  3.95 - 5.13 10*12/L Final    HGB 01/24/2022 11.2 (L)  11.3 - 14.9 g/dL Final    HCT 64/33/2951 32.6 (L)  34.0 - 44.0 % Final    MCV 01/24/2022 81.0  77.6 - 95.7 fL Final    MCH 01/24/2022 27.8  25.9 - 32.4 pg Final    MCHC 01/24/2022 34.3  32.0 - 36.0 g/dL Final    RDW 88/41/6606 13.0  12.2 - 15.2 % Final    MPV 01/24/2022 7.8  6.8 - 10.7 fL Final    Platelet 01/24/2022 145 (L)  150 - 450 10*9/L Final    Neutrophils % 01/24/2022 59.1  % Final    Lymphocytes % 01/24/2022 29.7  % Final    Monocytes % 01/24/2022 8.8  % Final    Eosinophils % 01/24/2022 1.7  % Final    Basophils % 01/24/2022 0.7  % Final    Absolute Neutrophils 01/24/2022 6.4  1.8 - 7.8 10*9/L Final    Absolute Lymphocytes 01/24/2022 3.2  1.1 - 3.6 10*9/L Final    Absolute Monocytes 01/24/2022 1.0 (H)  0.3 - 0.8 10*9/L Final    Absolute Eosinophils 01/24/2022 0.2  0.0 - 0.5 10*9/L Final    Absolute Basophils 01/24/2022 0.1  0.0 - 0.1 10*9/L Final   Lab on 01/10/2022   Component Date Value Ref Range Status    Sodium 01/10/2022 139  135 - 145 mmol/L Final    Potassium 01/10/2022 3.9  3.4 - 4.8 mmol/L Final    Chloride 01/10/2022 105  98 - 107 mmol/L Final  CO2 01/10/2022 28.0  20.0 - 31.0 mmol/L Final    Anion Gap 01/10/2022 6  5 - 14 mmol/L Final    BUN 01/10/2022 14  9 - 23 mg/dL Final    Creatinine 29/56/2130 0.61  0.60 - 0.80 mg/dL Final    BUN/Creatinine Ratio 01/10/2022 23   Final    eGFR CKD-EPI (2021) Female 01/10/2022 >90  >=60 mL/min/1.86m2 Final    eGFR calculated with CKD-EPI 2021 equation in accordance with [employer] and [employer] of Nephrology Task Force recommendations.    Glucose 01/10/2022 98  70 - 179 mg/dL Final    Calcium 86/57/8469 9.0  8.7 - 10.4 mg/dL Final    Albumin 62/95/2841 3.1 (L)  3.4 - 5.0 g/dL Final    Total Protein 01/10/2022 6.7  5.7 - 8.2 g/dL Final    Total Bilirubin 01/10/2022 0.5  0.3 - 1.2 mg/dL Final    AST 32/44/0102 23  <=34 U/L Final    ALT 01/10/2022 29  10 - 49 U/L Final    Alkaline Phosphatase 01/10/2022 152 (H)  46 - 116 U/L Final    Magnesium 01/10/2022 1.8  1.6 - 2.6 mg/dL Final    WBC 72/53/6644 4.0  3.6 - 11.2 10*9/L Final    RBC 01/10/2022 4.14  3.95 - 5.13 10*12/L Final    HGB 01/10/2022 11.4  11.3 - 14.9 g/dL Final    HCT 03/47/4259 33.7 (L)  34.0 - 44.0 % Final    MCV 01/10/2022 81.3  77.6 - 95.7 fL Final    MCH 01/10/2022 27.4  25.9 - 32.4 pg Final    MCHC 01/10/2022 33.7  32.0 - 36.0 g/dL Final    RDW 56/38/7564 12.8  12.2 - 15.2 % Final    MPV 01/10/2022 8.0  6.8 - 10.7 fL Final    Platelet 01/10/2022 158  150 - 450 10*9/L Final    Neutrophils % 01/10/2022 35.8  % Final    Lymphocytes % 01/10/2022 45.3  % Final    Monocytes % 01/10/2022 15.0  % Final    Eosinophils % 01/10/2022 3.1  % Final    Basophils % 01/10/2022 0.8  % Final    Absolute Neutrophils 01/10/2022 1.4 (L)  1.8 - 7.8 10*9/L Final    Absolute Lymphocytes 01/10/2022 1.8  1.1 - 3.6 10*9/L Final    Absolute Monocytes 01/10/2022 0.6  0.3 - 0.8 10*9/L Final    Absolute Eosinophils 01/10/2022 0.1  0.0 - 0.5 10*9/L Final    Absolute Basophils 01/10/2022 0.0  0.0 - 0.1 10*9/L Final         ADDITIONAL INSTRUCTIONS  In addition to the plan outlined above, please call us if you experience:   1. Nausea or vomiting that doesn't resolve after a few days  2. Changes in your bowels that persist for a few weeks  3. New pains that last for a few weeks   4. New abdominal swelling lasting more than a week or two  5. Any other concerning symptom lasting for a few weeks    Cancer Care Navigation Program: Please reach out to [email or web address] to learn more about available resources and support services (ie. assistance with local lodging/transportation, emotional needs, connections within your community, etc).     For health related questions call:   For appointments & questions Monday through Friday 8 AM-- 5 PM   please call [phone] or Toll free [phone].    On Nights, Weekends and Holidays  Call [phone] and ask  for the oncologist on call.

## 2022-01-24 NOTE — Unmapped (Signed)
Lab on 01/24/2022   Component Date Value Ref Range Status    Sodium 01/24/2022 142  135 - 145 mmol/L Final    Potassium 01/24/2022 3.5  3.5 - 5.1 mmol/L Final    Chloride 01/24/2022 106  98 - 107 mmol/L Final    CO2 01/24/2022 31.0  20.0 - 31.0 mmol/L Final    Anion Gap 01/24/2022 5  5 - 14 mmol/L Final    BUN 01/24/2022 14  9 - 23 mg/dL Final    Creatinine 62/95/2841 0.71  0.60 - 0.80 mg/dL Final    BUN/Creatinine Ratio 01/24/2022 20   Final    eGFR CKD-EPI (2021) Female 01/24/2022 >90  >=60 mL/min/1.64m2 Final    eGFR calculated with CKD-EPI 2021 equation in accordance with [employer] and [employer] of Nephrology Task Force recommendations.    Glucose 01/24/2022 91  70 - 179 mg/dL Final    Calcium 32/44/0102 8.8  8.7 - 10.4 mg/dL Final    Albumin 72/53/6644 3.1 (L)  3.4 - 5.0 g/dL Final    Total Protein 01/24/2022 6.2  5.7 - 8.2 g/dL Final    Total Bilirubin 01/24/2022 0.3  0.3 - 1.2 mg/dL Final    AST 03/47/4259 20  <=34 U/L Final    ALT 01/24/2022 23  10 - 49 U/L Final    Alkaline Phosphatase 01/24/2022 141 (H)  46 - 116 U/L Final    Magnesium 01/24/2022 1.7  1.6 - 2.6 mg/dL Final    CA 56-3 87/56/4332 511.21 (H)  0 - 35 U/mL Final    WBC 01/24/2022 10.8  3.6 - 11.2 10*9/L Final    RBC 01/24/2022 4.02  3.95 - 5.13 10*12/L Final    HGB 01/24/2022 11.2 (L)  11.3 - 14.9 g/dL Final    HCT 95/18/8416 32.6 (L)  34.0 - 44.0 % Final    MCV 01/24/2022 81.0  77.6 - 95.7 fL Final    MCH 01/24/2022 27.8  25.9 - 32.4 pg Final    MCHC 01/24/2022 34.3  32.0 - 36.0 g/dL Final    RDW 60/63/0160 13.0  12.2 - 15.2 % Final    MPV 01/24/2022 7.8  6.8 - 10.7 fL Final    Platelet 01/24/2022 145 (L)  150 - 450 10*9/L Final    Neutrophils % 01/24/2022 59.1  % Final    Lymphocytes % 01/24/2022 29.7  % Final    Monocytes % 01/24/2022 8.8  % Final    Eosinophils % 01/24/2022 1.7  % Final    Basophils % 01/24/2022 0.7  % Final    Absolute Neutrophils 01/24/2022 6.4  1.8 - 7.8 10*9/L Final    Absolute Lymphocytes 01/24/2022 3.2  1.1 - 3.6 10*9/L Final    Absolute Monocytes 01/24/2022 1.0 (H)  0.3 - 0.8 10*9/L Final    Absolute Eosinophils 01/24/2022 0.2  0.0 - 0.5 10*9/L Final    Absolute Basophils 01/24/2022 0.1  0.0 - 0.1 10*9/L Final

## 2022-01-24 NOTE — Unmapped (Signed)
Labs found to be within parameters for treatment today. Request for drug sent to pharmacy.

## 2022-01-24 NOTE — Unmapped (Unsigned)
Port accessed by RN Daniel Maxwell, labs drawn and sent.

## 2022-01-25 NOTE — Unmapped (Signed)
Patient's spouse called to verify a disconnect appointment for tomorrow because it wasn't showing on AVS. Patient finished treatment at 1850, which would put disconnect until 5pm Patient has been not wanting to do self-disconnects and family didn't feel comfortable doing it as well. Spouse was advised that the clinic will be closed before her disconnect time, and she needs to get full dose so we won't be able to do early disconnect. Advised patient to either turn off the pump when it beeps, instructed how to take off clamshell and pull white pieces apart and come back to the clinic early Monday morning to have port de-accessed OR go to Emergency Room around 5pm tomorrow and have them disconnect her from the pump. Spouse, and patient, verbalized understanding.

## 2022-01-25 NOTE — Unmapped (Signed)
Pt arrived to room 27. No complaints. PORT already in with blood return. Pt tolerated treatment with no complaints. AVS declined. Pt discharged to home with CADD pump running.

## 2022-01-26 ENCOUNTER — Ambulatory Visit
Admit: 2022-01-26 | Discharge: 2022-01-26 | Disposition: A | Discharge status: Home / Self Care | Payer: PRIVATE HEALTH INSURANCE

## 2022-01-26 DIAGNOSIS — Z452 Encounter for adjustment and management of vascular access device: Principal | ICD-10-CM

## 2022-01-26 MED ADMIN — heparin, porcine (PF) 100 unit/mL injection 300 Units: 300 [IU] | INTRAVENOUS | @ 22:00:00 | Stop: 2022-01-26

## 2022-01-26 NOTE — Unmapped (Signed)
Patient here to have Power port disconnected and flushed with Heparin

## 2022-01-27 NOTE — Unmapped (Signed)
[facility] Emergency Department Provider Note      ED Clinical Impression     Final diagnoses:   Encounter for care related to Port-a-Cath (Primary)       HPI, ED Course, Assessment and Plan     Initial Clinical Impression:    January 26, 2022 5:18 PM   Norma Green is a 61 y.o. female with past medical history of HTN and pancreatic adenocarcinoma presenting for removal of her Power Port. The patient is on chemotherapy (FOLFOX) and wants her power port disconnected. Her infusion finished today after the infusion center closed today, prompting presentation to the ED. Denies redness, swelling.     BP 173/83  - Pulse 96  - Temp 37 ??C (98.6 ??F) (Oral)  - Resp 18  - SpO2 100%     Exam is unremarkable.    Medical Decision Making  Will ask nurse to help patient with Power Port disconnection.    Amount and/or Complexity of Data Reviewed  Independent Historian: spouse          Further ED updates and updates to plan as per ED Course below:    ED Course:       Patient's port was disconnected, and flushed with heparin.  Patient was discharged.    Social Determinants of Health with Concerns     Financial Resource Strain: Medium Risk    Difficulty of Paying Living Expenses: Somewhat hard   Food Insecurity: Food Insecurity Present    Worried About Programme researcher, broadcasting/film/video in the Last Year: Never true    Ran Out of Food in the Last Year: Often true   Tobacco Use: High Risk    Smoking Tobacco Use: Every Day    Smokeless Tobacco Use: Unknown    Passive Exposure: Not on file   Housing/Utilities: High Risk    Within the past 12 months, have you ever stayed: outside, in a car, in a tent, in an overnight shelter, or temporarily in someone else's home (i.e. couch-surfing)?: No    Are you worried about losing your housing?: No    Within the past 12 months, have you been unable to get utilities (heat, electricity) when it was really needed?: Yes   Alcohol Use: Not on file   Substance Use: Not on file   Physical Activity: Not on file Interpersonal Safety: Not on file   Stress: Not on file   Intimate Partner Violence: Not on file   Depression: Not on file   Social Connections: Not on file     _____________________________________________________________________    The case was discussed with the attending physician who is in agreement with the above assessment and plan    Additional Medical Decision Making     I have reviewed the vital signs and the nursing notes. Labs and radiology results that were available during my care of the patient were independently reviewed by me and considered in my medical decision making.   I independently visualized the EKG tracing if performed  I independently visualized the radiology images if performed  I reviewed the patient's prior medical records if available.  Additional history obtained from family if available.  For specific reads/information impacting care please refer to MDM/ED Course continued documentation    Past History     PAST MEDICAL HISTORY/PAST SURGICAL HISTORY:   Past Medical History:   Diagnosis Date    Allergic rhinitis 12/30/2007    Hypertension     Murmur, heart  Sleep related leg cramps 11/03/2011    Tobacco use disorder 11/03/2011       Past Surgical History:   Procedure Laterality Date    IR INSERT PORT AGE GREATER THAN 5 YRS  12/26/2021    IR INSERT PORT AGE GREATER THAN 5 YRS 12/26/2021 Alease Frame, NP IMG [facility]IR [facility]    OVARIAN CYST REMOVAL      PR COLONOSCOPY W/BIOPSY SINGLE/MULTIPLE  08/03/2013    Procedure: COLONOSCOPY, FLEXIBLE, PROXIMAL TO SPLENIC FLEXURE; WITH BIOPSY, SINGLE OR MULTIPLE;  Surgeon: Romero Belling, MD;  [facility];  Service: Gastroenterology    PR ERCP STENT PLACEMENT BILIARY/PANCREATIC DUCT N/A 12/12/2021    Procedure: ENDOSCOPIC RETROGRADE CHOLANGIOPANCREATOGRAPHY (ERCP); WITH PLACEMENT OF ENDOSCOPIC STENT INTO BILIARY OR PANCREATIC DUCT;  Surgeon: Vonda Antigua, MD;  Location: [facility];  Service: Gastroenterology    PR ERCP,SPHINCTEROTOMY N/A 12/12/2021    Procedure: ERCP; W/SPHINCTEROTOMY/PAPILLOTOMY;  Surgeon: Vonda Antigua, MD;  Location: [facility];  Service: Gastroenterology    PR UPGI ENDOSCOPY W/US FN BX N/A 12/12/2021    Procedure: UGI W/ TRANSENDOSCOPIC ULTRASOUND GUIDED INTRAMURAL/TRANSMURAL FINE NEEDLE ASPIRATION/BIOPSY(S), ESOPHAGUS;  Surgeon: Vonda Antigua, MD;  Location: [facility];  Service: Gastroenterology    TUBAL LIGATION Bilateral 1998       MEDICATIONS:   No current facility-administered medications for this encounter.    Current Outpatient Medications:     amLODIPine (NORVASC) 10 MG tablet, TAKE ONE TABLET BY MOUTH NIGHTLY, Disp: 30 tablet, Rfl: 0    aspirin (ECOTRIN) 81 MG tablet, Take 1 tablet (81 mg total) by mouth daily., Disp: , Rfl:     dexAMETHasone (DECADRON) 4 MG tablet, Take 2 tablets (8 mg total) by mouth daily. Take only on days 2, 3, and 4 of each 14-day chemotherapy cycle., Disp: 12 tablet, Rfl: 5    heparin, porcine, PF, 100 unit/mL Syrg, Infuse 5 mL into a venous catheter every fourteen (14) days. For use while patient is on fluorouracil (5-FU) home infusion.  Flush IV catheter with heparin 5 mL as directed after final saline flush per SASH method and as needed for line maintenance., Disp: 4 each, Rfl: PRN    hydrochlorothiazide (HYDRODIURIL) 25 MG tablet, TAKE ONE TABLET BY MOUTH ONCE DAILY, Disp: 30 tablet, Rfl: 0    levoFLOXacin (LEVAQUIN) 750 MG tablet, Take 1 tablet (750 mg total) by mouth daily., Disp: 3 tablet, Rfl: 0    lisinopril (PRINIVIL,ZESTRIL) 40 MG tablet, Take 1 tablet (40 mg total) by mouth daily., Disp: 30 tablet, Rfl: 11    loperamide (IMODIUM) 2 mg capsule, If having diarrhea, take 2 capsules (4 mg) after the first loose stool, then 1 capsule every 2 hours until diarrhea free for 12 hours., Disp: 60 capsule, Rfl: 11    metoprolol succinate (TOPROL XL) 100 MG 24 hr tablet, Take 1 tablet (100 mg total) by mouth daily., Disp: 30 tablet, Rfl: 11    nicotine (NICODERM CQ) 7 mg/24 hr patch, Place 1 patch on the skin daily., Disp: 28 patch, Rfl: 1    ondansetron (ZOFRAN) 8 MG tablet, Take 1 tablet (8 mg total) by mouth every eight (8) hours as needed for nausea., Disp: 30 tablet, Rfl: 2    oxyCODONE (ROXICODONE) 5 MG immediate release tablet, Take 1 tablet (5 mg total) by mouth every four (4) hours as needed for up to 15 days., Disp: 90 tablet, Rfl: 0    pegfilgrastim-cbqv (UDENYCA) 6 mg/0.6 mL injection,  Inject the contents of 1 syringe (6 mg) under the skin once on day 4 each chemotherapy cycle (24 hours after the completion of chemotherapy)., Disp: 1.2 mL, Rfl: 5    polyethylene glycol (GLYCOLAX) 17 gram/dose powder, Take 17 g by mouth daily., Disp: 255 g, Rfl: 11    prochlorperazine (COMPAZINE) 10 MG tablet, Take 1 tablet (10 mg total) by mouth every six (6) hours as needed (nausea)., Disp: 30 tablet, Rfl: 2    senna (SENOKOT) 8.6 mg tablet, Take 1 tablet by mouth Two (2) times a day., Disp: 60 tablet, Rfl: 2    sertraline (ZOLOFT) 50 MG tablet, Take 1 tablet (50 mg total) by mouth daily., Disp: 30 tablet, Rfl: 11    sodium chloride (NS) 0.9 % injection, Infuse 10 mL into a venous catheter every fourteen (14) days. For use while patient is on fluorouracil (5-FU) home infusion.  Flush IV catheter with normal saline 10 mL prior to and after infusion followed by heparin flush per SASH method and as needed for line maintenance., Disp: 6 each, Rfl: PRN    ALLERGIES:   Metronidazole    SOCIAL HISTORY:   Social History     Tobacco Use    Smoking status: Every Day     Packs/day: 0.50     Types: Cigarettes    Smokeless tobacco: Not on file    Tobacco comments:     Quit 2 weeks ago   Substance Use Topics    Alcohol use: No       FAMILY HISTORY:  Family History   Problem Relation Age of Onset    Hypertension Mother     Stroke Mother     Coronary artery disease Mother     Diabetes Father     Diabetes Sister     Breast cancer Neg Hx     Cancer Neg Hx Colon cancer Neg Hx     Endometrial cancer Neg Hx     Ovarian cancer Neg Hx           Review of Systems     A review of systems was performed and relevant portions were as noted above in HPI     Physical Exam     VITAL SIGNS:    BP 173/83  - Pulse 96  - Temp 37 ??C (98.6 ??F) (Oral)  - Resp 18  - SpO2 100%       Constitutional:   Alert and oriented.   Head:   Normocephalic and atraumatic  Eyes:   Conjunctivae are normal, EOMI, PERRL  ENT:   No notable congestion, Mucous membranes moist, External ears normal, no notable stridor  Cardiovascular:   Rate as vitals above. Appears warm and well perfused  Respiratory:   Normal respiratory effort. Breath sounds are normal.  Gastrointestinal:   Soft, non-distended, and nontender.   Genitourinary:   Deferred  Musculoskeletal:    Normal range of motion in all extremities. No tenderness or edema noted in B/L lower extremities  Neurologic:   No gross focal neurologic deficits beyond baseline are appreciated.  Skin:   Skin is warm, dry and intact.       Radiology     No orders to display       Labs     Labs Reviewed - No data to display      Pertinent labs & imaging results that were available during my care of the patient were reviewed by me and considered in my medical decision  making (see chart for details).    Please note- This chart has been created using [employer]. Chart creation errors have been sought, but may not always be located and such creation errors, especially pronoun confusion, do NOT reflect on the standard of medical care.    Documentation assistance was provided by Johnathan Hausen and Gus Height, Scribes on January 26, 2022 at 5:32 PM for Curlene Labrum, MD.       Documentation assistance was provided by the scribe in my presence.  The documentation recorded by the scribe has been reviewed by me and accurately reflects the services I personally performed.         Vilinda Blanks., MD  Resident  01/26/22 [phone]

## 2022-01-27 NOTE — Unmapped (Signed)
[facility] Outpatient Nutrition Services   Medical Nutrition Therapy Consultation       Visit Type:    Return Assessment    Referral Reason:   Pancreatic cancer, wt loss and supplement recommendations      Norma Green is a 61 y.o. 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 Receiving FOLFIRINOX. Hx of ERCP with stent placement and recent hospitalization for cholecystitis. Her active problem list, medication list, allergies, family history, social history, notes from last a few encounters, and lab results were reviewed.     Past Medical History:   Diagnosis Date    Allergic rhinitis 12/30/2007    Hypertension     Murmur, heart     Sleep related leg cramps 11/03/2011    Tobacco use disorder 11/03/2011     Anthropometrics   Height: 170.2 cm  Current Weight: 58.1 kg  BMI: 20.1 kg/m2  Ideal Body Weight: 61.4 kg (Hamwi Method)  Percent Ideal Body Weight: 95%  Recent wt change: 2.2 kg wt loss in 1 month  % wt change: 3.6% wt loss in 1 month    Weight history:  Wt Readings from Last 6 Encounters:   01/24/22 58.1 kg (128 lb 1.6 oz)   01/10/22 58.7 kg (129 lb 4.8 oz)   12/26/21 60.3 kg (133 lb)   12/26/21 59.6 kg (131 lb 4.8 oz)   12/22/21 59.6 kg (131 lb 8 oz)   12/20/21 58.5 kg (128 lb 14.4 oz)       Nutrition Risk Screening:   Food Insecurity: Food Insecurity Present    Worried About Programme researcher, broadcasting/film/video in the Last Year: Never true    Ran Out of Food in the Last Year: Often true      Physical Activity:  Physical activity level is light with some exercise.     ASPEN/AND Malnutrition Screening:   Pt meets ASPEN/AND criteria for malnutrition as noted below:  Malnutrition Designation: Non-severe (moderate) in the setting of chronic illness  -- Weight loss: not significant  -- Energy Intake: possibly meeting <= 75% estimated energy needs >= 1 week  -- Muscle Mass: mild temporal wasting, mild interosseous wasting  -- Fat Mass: mild-moderate orbital wasting  -- Fluid Accumulation: did not assess  -- Functional Assessment: did not assess    Biochemical Data, Medical Tests and Procedures:  All pertinent labs and imaging reviewed by Howie Ill at 1:00 PM 01/24/2022.    Medications and Vitamin/Mineral Supplementation:   All nutritionally pertinent medications reviewed on 01/24/2022.   She is not taking nutrition supplements.     Current Outpatient Medications   Medication Sig Dispense Refill    amLODIPine (NORVASC) 10 MG tablet TAKE ONE TABLET BY MOUTH NIGHTLY 30 tablet 0    aspirin (ECOTRIN) 81 MG tablet Take 1 tablet (81 mg total) by mouth daily.      dexAMETHasone (DECADRON) 4 MG tablet Take 2 tablets (8 mg total) by mouth daily. Take only on days 2, 3, and 4 of each 14-day chemotherapy cycle. 12 tablet 5    heparin, porcine, PF, 100 unit/mL Syrg Infuse 5 mL into a venous catheter every fourteen (14) days. For use while patient is on fluorouracil (5-FU) home infusion.   Flush IV catheter with heparin 5 mL as directed after final saline flush per SASH method and as needed for line maintenance. 4 each PRN  hydrochlorothiazide (HYDRODIURIL) 25 MG tablet TAKE ONE TABLET BY MOUTH ONCE DAILY 30 tablet 0    levoFLOXacin (LEVAQUIN) 750 MG tablet Take 1 tablet (750 mg total) by mouth daily. 3 tablet 0    lisinopril (PRINIVIL,ZESTRIL) 40 MG tablet Take 1 tablet (40 mg total) by mouth daily. 30 tablet 11    loperamide (IMODIUM) 2 mg capsule If having diarrhea, take 2 capsules (4 mg) after the first loose stool, then 1 capsule every 2 hours until diarrhea free for 12 hours. 60 capsule 11    metoprolol succinate (TOPROL XL) 100 MG 24 hr tablet Take 1 tablet (100 mg total) by mouth daily. 30 tablet 11    nicotine (NICODERM CQ) 7 mg/24 hr patch Place 1 patch on the skin daily. 28 patch 1    ondansetron (ZOFRAN) 8 MG tablet Take 1 tablet (8 mg total) by mouth every eight (8) hours as needed for nausea. 30 tablet 2    oxyCODONE (ROXICODONE) 5 MG immediate release tablet Take 1 tablet (5 mg total) by mouth every four (4) hours as needed for up to 15 days. 90 tablet 0    pegfilgrastim-cbqv (UDENYCA) 6 mg/0.6 mL injection Inject the contents of 1 syringe (6 mg) under the skin once on day 4 each chemotherapy cycle (24 hours after the completion of chemotherapy). 1.2 mL 5    polyethylene glycol (GLYCOLAX) 17 gram/dose powder Take 17 g by mouth daily. 255 g 11    prochlorperazine (COMPAZINE) 10 MG tablet Take 1 tablet (10 mg total) by mouth every six (6) hours as needed (nausea). 30 tablet 2    senna (SENOKOT) 8.6 mg tablet Take 1 tablet by mouth Two (2) times a day. 60 tablet 2    sertraline (ZOLOFT) 50 MG tablet Take 1 tablet (50 mg total) by mouth daily. 30 tablet 11    sodium chloride (NS) 0.9 % injection Infuse 10 mL into a venous catheter every fourteen (14) days. For use while patient is on fluorouracil (5-FU) home infusion.   Flush IV catheter with normal saline 10 mL prior to and after infusion followed by heparin flush per SASH method and as needed for line maintenance. 6 each PRN     No current facility-administered medications for this visit.       Nutrition History:     Dietary Restrictions: No known food allergies or food intolerances.     Gastrointestinal Issues: Nausea, small amount diarrhea, managing constipation with daily Miralax, senna    Oral Issues: Loss of taste    Hunger and Satiety: Early satiety.    Food Safety and Access: She expressed limited access to nutrition-related supplies- Ensure shakes.      Diet Recall:   Breakfast: grits, egg, toast, bacon/sausage   Lunch: clam chowder or beef and veggie soup  Dinner: baked chicken/fish/pork chops with rice, broccoli  Snack(s): fruit  Beverages: 1 cup coffee, 1 cup orange juice, 1 cup lemonade, sipping on water throughout day  Alcohol: none reported  Supplements: Original Ensure- recently ran out  Enteral feedings: No feeding tube    Pt reports eating small amounts every 2-3 hours. Family feels pt eating less than reported.  Nutrition Assessment     Possibly meeting <75% estimated nutrition needs based on dietary recall, unintentional wt loss. Oral nutrition supplements remain indicated.    May benefit from micronutrient supplementation as diet may become more restricted with escalation of chemo regimen.    Daily Estimated Nutritional Needs:  Energy: 30-35 kcal/kg, actual  body weight  Protein: 1.1-1.3 g/kg, actual body weight  Carbohydrate: no restriction  Fluid: 1 ml/kcal  Nutrition Goals & Evaluation    - Intake will aim to meet > 75% of estimated nutrient needs.  (Not Met)  - Aim for weight maintenance and/or gain.  (Progressing)      Nutrition goals reviewed, and relevant barriers identified and addressed: acuity of illness. She is evaluated to have good willingness and ability to achieve nutrition goals.   Nutrition Intervention      - Nutrition Counseling: Goal setting Problem solving  - Oral Nutrition Supplementation: Ensure Original or equivalent  - Vitamin/Mineral Supplementation: multivitamin    Nutrition Plan:   Recommend small, frequent meals with high calorie/high protein food.  Discussed dietary strategies to manage nausea such as opting for bland, room temp foods. Encouraged pt to sip on fluids throughout day.   Recommend Original Ensure or equivalent (e.g. Equate) X 1-3 daily based on appetite. Instructed pt to consume between meals.   Submitted [employer] Patient Assistance form for Ensure.  Recommend taking multivitamin for Women 50+, if needed.       Materials Provided: Nausea and Vomiting      Follow up will occur in 2-3 weeks, or as needed.       Food/Nutrition-related history, Anthropometric measurements, Biochemical data, medical tests, procedures, Nutrition-focused physical findings, Patient understanding or compliance with intervention and recommendations , and Effectiveness of nutrition interventions will be assessed at time of follow-up. Recommendations for Clinical Team, Care Team , and Referring Provider :  - Encourage pt to follow nutrition plan as noted above.     Time spent 15 minutes

## 2022-01-28 NOTE — Unmapped (Signed)
I spoke with patient Norma Green to confirm appointments on the following date(s): 02/17/22 for CT scan in [facility], and f/u with NP Triglianos on 02/21/22.    Estevan Oaks

## 2022-01-30 NOTE — Unmapped (Signed)
RN Navigator called pt to check in post FOLFOX infusion cycle. 3. Pt reports that she had nausea for several days post treatment. Took dex as recommended and alternated zofran and compazine which she felt helped with nausea.  Pt states that bowels are moving without difficulty now. Tolerating po intake and feeling much better.       Pt denies any questions or concerns at this time. Encouraged her to call with any future needs.

## 2022-02-05 NOTE — Unmapped (Signed)
[facility] Navigation: Follow-Up    Completed with: Hardin Negus (significant other)     Oncology Patient Navigator (OPN) provided follow-up call to review previously discussed resources/identified barriers and evaluate current needs.     Spoke to Mr. Norma Green, patient is eating well, drinking enough fluids to stay hydrated. No unexpected related to treatment side effects.      Per Mr. Norma Green,  patient already submitted all forms at [email or web address] he was not sure about grant.   OPN called  at [email or web address] following up for application, per [employer] application and income verification received, however 1st page from application was missing. Pet verification form for pet assistance also missing.  OPN completed page 1 and was submitted to [employer]. Financial assistance pending.     Mr. Norma Green endorsed understanding of upcoming appointment times/location as well as how to contact medical team if needed.  No food, emotional or transportation concerns.     Follow up call  will be done in the next weeks.

## 2022-02-07 ENCOUNTER — Ambulatory Visit: Admit: 2022-02-07 | Discharge: 2022-02-08 | Payer: PRIVATE HEALTH INSURANCE

## 2022-02-07 ENCOUNTER — Ambulatory Visit
Admit: 2022-02-07 | Discharge: 2022-02-08 | Payer: PRIVATE HEALTH INSURANCE | Attending: Registered" | Primary: Registered"

## 2022-02-07 DIAGNOSIS — C259 Malignant neoplasm of pancreas, unspecified: Principal | ICD-10-CM

## 2022-02-07 LAB — CBC W/ AUTO DIFF
BASOPHILS ABSOLUTE COUNT: 0.1 10*9/L (ref 0.0–0.1)
BASOPHILS RELATIVE PERCENT: 0.6 %
EOSINOPHILS ABSOLUTE COUNT: 0.1 10*9/L (ref 0.0–0.5)
EOSINOPHILS RELATIVE PERCENT: 0.8 %
HEMATOCRIT: 28.5 % — ABNORMAL LOW (ref 34.0–44.0)
HEMOGLOBIN: 9.7 g/dL — ABNORMAL LOW (ref 11.3–14.9)
LYMPHOCYTES ABSOLUTE COUNT: 2.2 10*9/L (ref 1.1–3.6)
LYMPHOCYTES RELATIVE PERCENT: 20.9 %
MEAN CORPUSCULAR HEMOGLOBIN CONC: 34 g/dL (ref 32.0–36.0)
MEAN CORPUSCULAR HEMOGLOBIN: 27.9 pg (ref 25.9–32.4)
MEAN CORPUSCULAR VOLUME: 82.2 fL (ref 77.6–95.7)
MEAN PLATELET VOLUME: 7.6 fL (ref 6.8–10.7)
MONOCYTES ABSOLUTE COUNT: 0.7 10*9/L (ref 0.3–0.8)
MONOCYTES RELATIVE PERCENT: 6.5 %
NEUTROPHILS ABSOLUTE COUNT: 7.6 10*9/L (ref 1.8–7.8)
NEUTROPHILS RELATIVE PERCENT: 71.2 %
PLATELET COUNT: 171 10*9/L (ref 150–450)
RED BLOOD CELL COUNT: 3.46 10*12/L — ABNORMAL LOW (ref 3.95–5.13)
RED CELL DISTRIBUTION WIDTH: 13.8 % (ref 12.2–15.2)
WBC ADJUSTED: 10.7 10*9/L (ref 3.6–11.2)

## 2022-02-07 LAB — COMPREHENSIVE METABOLIC PANEL
ALBUMIN: 2.9 g/dL — ABNORMAL LOW (ref 3.4–5.0)
ALKALINE PHOSPHATASE: 133 U/L — ABNORMAL HIGH (ref 46–116)
ALT (SGPT): 24 U/L (ref 10–49)
ANION GAP: 6 mmol/L (ref 5–14)
AST (SGOT): 19 U/L (ref <=34)
BILIRUBIN TOTAL: 0.3 mg/dL (ref 0.3–1.2)
BLOOD UREA NITROGEN: 13 mg/dL (ref 9–23)
BUN / CREAT RATIO: 21
CALCIUM: 8.7 mg/dL (ref 8.7–10.4)
CHLORIDE: 108 mmol/L — ABNORMAL HIGH (ref 98–107)
CO2: 30 mmol/L (ref 20.0–31.0)
CREATININE: 0.62 mg/dL
EGFR CKD-EPI (2021) FEMALE: >90 mL/min/{1.73_m2} (ref >=60)
GLUCOSE RANDOM: 128 mg/dL (ref 70–179)
POTASSIUM: 3.2 mmol/L — ABNORMAL LOW (ref 3.4–4.8)
PROTEIN TOTAL: 6 g/dL (ref 5.7–8.2)
SODIUM: 144 mmol/L (ref 135–145)

## 2022-02-07 LAB — SLIDE REVIEW

## 2022-02-07 LAB — MAGNESIUM: MAGNESIUM: 1.5 mg/dL — ABNORMAL LOW (ref 1.6–2.6)

## 2022-02-07 MED ORDER — LIDOCAINE-PRILOCAINE 2.5 %-2.5 % TOPICAL CREAM
3 refills | 0.00000 days | Status: CP
Start: 2022-02-07 — End: 2023-02-06
  Filled 2022-02-07: qty 5, 1d supply, fill #0

## 2022-02-07 MED ADMIN — irinotecan (CAMPTOSAR) 249 mg in dextrose 5 % 500 mL IVPB: 150 mg/m2 | INTRAVENOUS | @ 21:00:00 | Stop: 2022-02-07

## 2022-02-07 MED ADMIN — leucovorin 664 mg in dextrose 5 % 50 mL IVPB: 400 mg/m2 | INTRAVENOUS | @ 21:00:00 | Stop: 2022-02-07

## 2022-02-07 MED ADMIN — oxaliplatin (ELOXATIN) 141.1 mg in dextrose 5 % 250 mL IVPB: 85 mg/m2 | INTRAVENOUS | @ 19:00:00 | Stop: 2022-02-07

## 2022-02-07 MED ADMIN — sodium chloride 0.9% (NS) bolus 500 mL: 500 mL | INTRAVENOUS | @ 17:00:00 | Stop: 2022-02-07

## 2022-02-07 MED ADMIN — potassium chloride ER tablet 40 mEq: 40 meq | ORAL | @ 17:00:00 | Stop: 2022-02-07

## 2022-02-07 MED ADMIN — dexAMETHasone (DECADRON) tablet 20 mg: 20 mg | ORAL | @ 17:00:00 | Stop: 2022-02-07

## 2022-02-07 MED ADMIN — magnesium oxide (MAG-OX) tablet 800 mg: 800 mg | ORAL | @ 17:00:00 | Stop: 2022-02-07

## 2022-02-07 MED ADMIN — dextrose 5 % infusion: 100 mL/h | INTRAVENOUS | @ 19:00:00

## 2022-02-07 MED ADMIN — atropine injection: .4 mg | INTRAVENOUS | @ 21:00:00

## 2022-02-07 MED ADMIN — prochlorperazine (COMPAZINE) injection 10 mg: 10 mg | INTRAVENOUS | @ 23:00:00

## 2022-02-07 MED ADMIN — fluorouracil (ADRUCIL) 4,000 mg in sodium chloride (NS) 0.9 % 46-hr infusion CADD: 2400 mg/m2 | INTRAVENOUS | @ 23:00:00

## 2022-02-07 MED ADMIN — ondansetron (ZOFRAN) tablet 24 mg: 24 mg | ORAL | @ 17:00:00 | Stop: 2022-02-07

## 2022-02-07 NOTE — Unmapped (Unsigned)
Simeon Craft., PA at chairside.

## 2022-02-08 NOTE — Unmapped (Signed)
Hospital Outpatient Visit on 02/07/2022   Component Date Value Ref Range Status    Sodium 02/07/2022 144  135 - 145 mmol/L Final    Potassium 02/07/2022 3.2 (L)  3.4 - 4.8 mmol/L Final    Chloride 02/07/2022 108 (H)  98 - 107 mmol/L Final    CO2 02/07/2022 30.0  20.0 - 31.0 mmol/L Final    Anion Gap 02/07/2022 6  5 - 14 mmol/L Final    BUN 02/07/2022 13  9 - 23 mg/dL Final    Creatinine 16/05/9603 0.62  0.60 - 0.80 mg/dL Final    BUN/Creatinine Ratio 02/07/2022 21   Final    eGFR CKD-EPI (2021) Female 02/07/2022 >90  >=60 mL/min/1.52m2 Final    eGFR calculated with CKD-EPI 2021 equation in accordance with [employer] and [employer] of Nephrology Task Force recommendations.    Glucose 02/07/2022 128  70 - 179 mg/dL Final    Calcium 54/04/8118 8.7  8.7 - 10.4 mg/dL Final    Albumin 14/78/2956 2.9 (L)  3.4 - 5.0 g/dL Final    Total Protein 02/07/2022 6.0  5.7 - 8.2 g/dL Final    Total Bilirubin 02/07/2022 0.3  0.3 - 1.2 mg/dL Final    AST 21/30/8657 19  <=34 U/L Final    ALT 02/07/2022 24  10 - 49 U/L Final    Alkaline Phosphatase 02/07/2022 133 (H)  46 - 116 U/L Final    Magnesium 02/07/2022 1.5 (L)  1.6 - 2.6 mg/dL Final    WBC 84/69/6295 10.7  3.6 - 11.2 10*9/L Final    RBC 02/07/2022 3.46 (L)  3.95 - 5.13 10*12/L Final    HGB 02/07/2022 9.7 (L)  11.3 - 14.9 g/dL Final    HCT 28/41/3244 28.5 (L)  34.0 - 44.0 % Final    MCV 02/07/2022 82.2  77.6 - 95.7 fL Final    MCH 02/07/2022 27.9  25.9 - 32.4 pg Final    MCHC 02/07/2022 34.0  32.0 - 36.0 g/dL Final    RDW 08/27/7251 13.8  12.2 - 15.2 % Final    MPV 02/07/2022 7.6  6.8 - 10.7 fL Final    Platelet 02/07/2022 171  150 - 450 10*9/L Final    Neutrophils % 02/07/2022 71.2  % Final    Lymphocytes % 02/07/2022 20.9  % Final    Monocytes % 02/07/2022 6.5  % Final    Eosinophils % 02/07/2022 0.8  % Final    Basophils % 02/07/2022 0.6  % Final    Absolute Neutrophils 02/07/2022 7.6  1.8 - 7.8 10*9/L Final    Absolute Lymphocytes 02/07/2022 2.2  1.1 - 3.6 10*9/L Final    Absolute Monocytes 02/07/2022 0.7  0.3 - 0.8 10*9/L Final    Absolute Eosinophils 02/07/2022 0.1  0.0 - 0.5 10*9/L Final    Absolute Basophils 02/07/2022 0.1  0.0 - 0.1 10*9/L Final    Smear Review Comments 02/07/2022 See Comment (A)  Undefined Final    Slide Reviewed. ID [id number]    Dohle Bodies 02/07/2022 Present (A)  Not Present Final

## 2022-02-08 NOTE — Unmapped (Unsigned)
Patient arrived for C4D1 FOLFIRINOX. Patient reported pain/soreness to left side of neck X 1 day. Stated can feel it some when swallowing but able to swallow/eat/drink. Denied swelling, sob, or trouble breathing. Reported intermittent nausea, sometimes vomiting, and no diarrhea now but did have it a couple days ago. Stated she felt constipated, took a laxative and then was able to go. She also requested a prescription for port cream. Duwayne Heck, PA and pharmacist to chairside during visit. Mag 1.5 and K 3.2. Replaced with PO per provider. 500 ml NS and premeds administered. Patient aware additional nausea med can be given during treatment. Patient declined. Patient tolerated treatment well. Irinotecan and leucovorin currently infusing. Report given to Helmut Muster, RN for transfer of care.

## 2022-02-08 NOTE — Unmapped (Unsigned)
1900  - patient tolerated treatment - patient has home fluorouracil infusion running and will come back Sunday to have it dc'd and remove the port needle.discharged home via wheelchair, her husband is driving.

## 2022-02-09 ENCOUNTER — Ambulatory Visit: Admit: 2022-02-09 | Discharge: 2022-02-10 | Payer: PRIVATE HEALTH INSURANCE

## 2022-02-09 MED ADMIN — heparin, porcine (PF) 100 unit/mL injection 500 Units: 500 [IU] | INTRAVENOUS | @ 19:00:00 | Stop: 2022-02-10

## 2022-02-09 NOTE — Unmapped (Signed)
Pt in unit for cadd pump disconnect per orders; pt resting in chair, call light in reach. Pump turned off by family member less than 30 minutes ago.  Pump disconnected; port line flushed with saline and heparin and port needle removed. Pt left ambulatory.

## 2022-02-12 NOTE — Unmapped (Signed)
[facility] Nutrition [facility]   Medical Nutrition Therapy Consultation       Visit Type:    Return Assessment    Referral Reason:   Pancreatic cancer, wt loss and supplement recommendations      Norma Green is a 61 y.o. 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 Receiving FOLFIRINOX. Hx of ERCP with stent placement and recent hospitalization for cholecystitis. Her active problem list, medication list, allergies, family history, social history, notes from last a few encounters, and lab results were reviewed.     Past Medical History:   Diagnosis Date    Allergic rhinitis 12/30/2007    Hypertension     Murmur, heart     Sleep related leg cramps 11/03/2011    Tobacco use disorder 11/03/2011     Anthropometrics   Height: 170.2 cm  Current Weight: 58.1 kg  BMI: 20.1 kg/m2  Ideal Body Weight: 61.4 kg (Hamwi Method)  Percent Ideal Body Weight: 95%  Recent wt change: 0.6 kg wt loss in 1 month  % wt change: 1% wt loss in 1 month    Weight history:  Wt Readings from Last 6 Encounters:   02/07/22 58.1 kg (128 lb 1.6 oz)   01/24/22 58.1 kg (128 lb 1.6 oz)   01/10/22 58.7 kg (129 lb 4.8 oz)   12/26/21 60.3 kg (133 lb)   12/26/21 59.6 kg (131 lb 4.8 oz)   12/22/21 59.6 kg (131 lb 8 oz)       Nutrition Risk Screening:   Food Insecurity: Food Insecurity Present    Worried About Programme researcher, broadcasting/film/video in the Last Year: Never true    Ran Out of Food in the Last Year: Often true      Physical Activity:  Physical activity level is light with some exercise.     ASPEN/AND Malnutrition Screening:   Pt meets ASPEN/AND criteria for malnutrition as noted below:  Malnutrition Designation: Non-severe (moderate) in the setting of chronic illness  -- Weight loss: not significant  -- Energy Intake: possibly meeting > 75% estimated energy needs >= 1 week  -- Muscle Mass: mild temporal wasting, mild interosseous wasting  -- Fat Mass: mild-moderate orbital wasting  -- Fluid Accumulation: did not assess  -- Functional Assessment: did not assess    Biochemical Data, Medical Tests and Procedures:  All pertinent labs and imaging reviewed by Howie Ill at 10:30 AM 02/07/2022.    K 3.2 mmol/L, Mg 1.5 mg/dL    Medications and Vitamin/Mineral Supplementation:   All nutritionally pertinent medications reviewed on 02/07/2022.   She is taking nutrition supplements: [employer]    K, Mg replaced po today in clinic.    Current Outpatient Medications   Medication Sig Dispense Refill    amLODIPine (NORVASC) 10 MG tablet TAKE ONE TABLET BY MOUTH NIGHTLY 30 tablet 0    aspirin (ECOTRIN) 81 MG tablet Take 1 tablet (81 mg total) by mouth daily.      dexAMETHasone (DECADRON) 4 MG tablet Take 2 tablets (8 mg total) by mouth daily. Take only on days 2, 3, and 4 of each 14-day chemotherapy cycle. 12 tablet 5    heparin, porcine, PF, 100 unit/mL Syrg Infuse 5 mL into a venous catheter every fourteen (14) days. For use while patient is on fluorouracil (5-FU) home infusion.   Flush IV catheter  with heparin 5 mL as directed after final saline flush per SASH method and as needed for line maintenance. 4 each PRN    hydrochlorothiazide (HYDRODIURIL) 25 MG tablet TAKE ONE TABLET BY MOUTH ONCE DAILY 30 tablet 0    levoFLOXacin (LEVAQUIN) 750 MG tablet Take 1 tablet (750 mg total) by mouth daily. 3 tablet 0    lidocaine-prilocaine (EMLA) 2.5-2.5 % cream Apply 30 minutes prior to port access. 5 g 3    lisinopril (PRINIVIL,ZESTRIL) 40 MG tablet Take 1 tablet (40 mg total) by mouth daily. 30 tablet 11    loperamide (IMODIUM) 2 mg capsule If having diarrhea, take 2 capsules (4 mg) after the first loose stool, then 1 capsule every 2 hours until diarrhea free for 12 hours. 60 capsule 11    metoprolol succinate (TOPROL XL) 100 MG 24 hr tablet Take 1 tablet (100 mg total) by mouth daily. 30 tablet 11    nicotine (NICODERM CQ) 7 mg/24 hr patch Place 1 patch on the skin daily. 28 patch 1    ondansetron (ZOFRAN) 8 MG tablet Take 1 tablet (8 mg total) by mouth every eight (8) hours as needed for nausea. 30 tablet 2    pegfilgrastim-cbqv (UDENYCA) 6 mg/0.6 mL injection Inject the contents of 1 syringe (6 mg) under the skin once on day 4 each chemotherapy cycle (24 hours after the completion of chemotherapy). 1.2 mL 5    polyethylene glycol (GLYCOLAX) 17 gram/dose powder Take 17 g by mouth daily. 255 g 11    prochlorperazine (COMPAZINE) 10 MG tablet Take 1 tablet (10 mg total) by mouth every six (6) hours as needed (nausea). 30 tablet 2    senna (SENOKOT) 8.6 mg tablet Take 1 tablet by mouth Two (2) times a day. 60 tablet 2    sertraline (ZOLOFT) 50 MG tablet Take 1 tablet (50 mg total) by mouth daily. 30 tablet 11    sodium chloride (NS) 0.9 % injection Infuse 10 mL into a venous catheter every fourteen (14) days. For use while patient is on fluorouracil (5-FU) home infusion.   Flush IV catheter with normal saline 10 mL prior to and after infusion followed by heparin flush per SASH method and as needed for line maintenance. 6 each PRN     No current facility-administered medications for this visit.       Nutrition History:     Dietary Restrictions: No known food allergies or food intolerances.     Gastrointestinal Issues: Nausea- managing with meds, Diarrhea X 2 days    Denies taking anything for diarrhea; restarts laxative once diarrhea resolves.    Oral Issues: Loss of taste    Hunger and Satiety: Early satiety.     Food Safety and Access: She expressed limited access to nutrition-related supplies- Ensure shakes.      Diet Recall:   Breakfast: grits, egg, toast, bacon/sausage or oatmeal or pancakes  Lunch: clam chowder or beef and veggie soup  Dinner: baked chicken/fish/pork chops with rice, broccoli  Snack(s): fruit  Beverages: 3 cups cranberry juice, 16 oz water   Alcohol: none reported  Supplements: Original Ensure X 2 daily  Enteral feedings: No feeding tube    Pt reports eating small amounts every 2-3 hours during first week of cycle. Describes urine as medium yellow.  Nutrition Assessment     Intake seems to remain poor during first week. Possibly meeting at least 75% estimated nutrition needs during second week of cycle. May benefit from micronutrient supplementation as  po intake varies. Current supplement does not provide most vitamins/minerals.    Oral nutrition supplements remain indicated.    Altered GI function related to treatment for pancreatic cancer as evidenced by reported diarrhea. Fluid intake inadequate as indicated by urine color; increased needs with diarrhea.    Daily Estimated Nutritional Needs:  Energy: 30-35 kcal/kg, actual body weight  Protein: 1.1-1.3 g/kg, actual body weight  Carbohydrate: no restriction  Fluid: 1 ml/kcal  Nutrition Goals & Evaluation    - Intake will aim to meet > 75% of estimated nutrient needs.  (Progressing)  - Aim for weight maintenance and/or gain.  (Met and Ongoing)  - Improve bowel regularity. (New)  - Adequate hydration. (New)      Nutrition goals reviewed, and relevant barriers identified and addressed: acuity of illness. She is evaluated to have good willingness and ability to achieve nutrition goals.   Nutrition Intervention      - Nutrition Counseling: Goal setting Problem solving  - Oral Nutrition Supplementation: Ensure Original or equivalent  - Vitamin/Mineral Supplementation: multivitamin    Nutrition Plan:   Recommend small, frequent meals with high calorie/high protein food.  Encouraged pt to use Imodium as prescribed; reviewed instructions.   Recommend Original Ensure or equivalent (e.g. Equate) X 1-3 daily based on appetite. Instructed pt to consume between meals.   Submitted [employer] Patient Assistance form for Ensure.  Encouraged pt to consume 7-9 cups fluids daily. Will need additional cup for each loose stool.  Recommend taking multivitamin for Women 50+.      Materials Provided: Diarrhea      Follow up will occur in 2-3 weeks, or as needed.       Food/Nutrition-related history, Anthropometric measurements, Biochemical data, medical tests, procedures, Nutrition-focused physical findings, Patient understanding or compliance with intervention and recommendations , and Effectiveness of nutrition interventions will be assessed at time of follow-up.     Recommendations for Clinical Team, Care Team , and Referring Provider :  - Encourage pt to follow nutrition plan as noted above.     Time spent 11 minutes

## 2022-02-13 NOTE — Unmapped (Signed)
[facility] Clinical Assessment & Refill Coordination Note    Norma Green, DOB: Feb 19, 1961  Phone: [phone] (home)     All above HIPAA information was verified with patient.     Was a Nurse, learning disability used for this call? No    Specialty Medication(s):   Hematology/Oncology: Norma Green     Current Outpatient Medications   Medication Sig Dispense Refill    amLODIPine (NORVASC) 10 MG tablet TAKE ONE TABLET BY MOUTH NIGHTLY 30 tablet 0    aspirin (ECOTRIN) 81 MG tablet Take 1 tablet (81 mg total) by mouth daily.      dexAMETHasone (DECADRON) 4 MG tablet Take 2 tablets (8 mg total) by mouth daily. Take only on days 2, 3, and 4 of each 14-day chemotherapy cycle. 12 tablet 5    heparin, porcine, PF, 100 unit/mL Syrg Infuse 5 mL into a venous catheter every fourteen (14) days. For use while patient is on fluorouracil (5-FU) home infusion.   Flush IV catheter with heparin 5 mL as directed after final saline flush per SASH method and as needed for line maintenance. 4 each PRN    hydrochlorothiazide (HYDRODIURIL) 25 MG tablet TAKE ONE TABLET BY MOUTH ONCE DAILY 30 tablet 0    levoFLOXacin (LEVAQUIN) 750 MG tablet Take 1 tablet (750 mg total) by mouth daily. 3 tablet 0    lidocaine-prilocaine (EMLA) 2.5-2.5 % cream Apply 30 minutes prior to port access. 5 g 3    lisinopril (PRINIVIL,ZESTRIL) 40 MG tablet Take 1 tablet (40 mg total) by mouth daily. 30 tablet 11    loperamide (IMODIUM) 2 mg capsule If having diarrhea, take 2 capsules (4 mg) after the first loose stool, then 1 capsule every 2 hours until diarrhea free for 12 hours. 60 capsule 11    metoprolol succinate (TOPROL XL) 100 MG 24 hr tablet Take 1 tablet (100 mg total) by mouth daily. 30 tablet 11    nicotine (NICODERM CQ) 7 mg/24 hr patch Place 1 patch on the skin daily. 28 patch 1    ondansetron (ZOFRAN) 8 MG tablet Take 1 tablet (8 mg total) by mouth every eight (8) hours as needed for nausea. 30 tablet 2    pegfilgrastim-cbqv (UDENYCA) 6 mg/0.6 mL injection Inject the contents of 1 syringe (6 mg) under the skin once on day 4 each chemotherapy cycle (24 hours after the completion of chemotherapy). 1.2 mL 5    polyethylene glycol (GLYCOLAX) 17 gram/dose powder Take 17 g by mouth daily. 255 g 11    prochlorperazine (COMPAZINE) 10 MG tablet Take 1 tablet (10 mg total) by mouth every six (6) hours as needed (nausea). 30 tablet 2    senna (SENOKOT) 8.6 mg tablet Take 1 tablet by mouth Two (2) times a day. 60 tablet 2    sertraline (ZOLOFT) 50 MG tablet Take 1 tablet (50 mg total) by mouth daily. 30 tablet 11    sodium chloride (NS) 0.9 % injection Infuse 10 mL into a venous catheter every fourteen (14) days. For use while patient is on fluorouracil (5-FU) home infusion.   Flush IV catheter with normal saline 10 mL prior to and after infusion followed by heparin flush per SASH method and as needed for line maintenance. 6 each PRN     No current facility-administered medications for this visit.        Changes to medications: Norma Green reports no changes at this time.    Allergies   Allergen Reactions  Metronidazole Nausea And Vomiting       Changes to allergies: No    SPECIALTY MEDICATION ADHERENCE     Udenyca 6 mg: 0 days of medicine on hand     Medication Adherence    Patient reported X missed doses in the last month: 0  Specialty Medication: Udenyca 6 mg  Patient is on additional specialty medications: No  Informant: patient  Confirmed plan for next specialty medication refill: delivery by pharmacy  Refills needed for supportive medications: not needed          Specialty medication(s) dose(s) confirmed: Regimen is correct and unchanged.     Are there any concerns with adherence? No    Adherence counseling provided? Not needed    CLINICAL MANAGEMENT AND INTERVENTION      Clinical Benefit Assessment:    Do you feel the medicine is effective or helping your condition? Yes    Clinical Benefit counseling provided? Not needed    Adverse Effects Assessment:    Are you experiencing any side effects? Yes, patient reports experiencing bone ache/pains and decreased tear production. Side effect counseling provided: suggested loratadine starting 2-3 days prior to dose and through injection and 2-3 days after.    Are you experiencing difficulty administering your medicine? No    Quality of Life Assessment:    Quality of Life    Rheumatology  Oncology  2. On a scale of 1-10, how would you rate your ability to manage side effects associated with your specialty medication? (1=no issues, 10 = unable to take medication due to side effects): 2  Dermatology  Cystic Fibrosis          How many days over the past month did your Pancreatic adenocarcinoma  keep you from your normal activities? For example, brushing your teeth or getting up in the morning. 0    Have you discussed this with your provider? Not needed    Acute Infection Status:    Acute infections noted within Epic:  No active infections  Patient reported infection: None    Therapy Appropriateness:    Is therapy appropriate and patient progressing towards therapeutic goals? Yes, therapy is appropriate and should be continued    DISEASE/MEDICATION-SPECIFIC INFORMATION      For patients on injectable medications: Patient currently has 0 doses left.  Next injection is scheduled for 02/24/22.    PATIENT SPECIFIC NEEDS     Does the patient have any physical, cognitive, or cultural barriers? No    Is the patient high risk? No    Does the patient require a Care Management Plan? No     SOCIAL DETERMINANTS OF HEALTH     At the [facility], we have learned that life circumstances - like trouble affording food, housing, utilities, or transportation can affect the health of many of our patients.   That is why we wanted to ask: are you currently experiencing any life circumstances that are negatively impacting your health and/or quality of life? No    Social Determinants of Health     Financial Resource Strain: Medium Risk    Difficulty of Paying Living Expenses: Somewhat hard   Internet Connectivity: No Internet connectivity concern identified    Do you have access to internet services: Yes    How do you connect to the internet: Personal Device at home    Is your internet connection strong enough for you to watch video on your device without major problems?: Yes    Do  you have enough data to get through the month?: Yes    Does at least one of the devices have a camera that you can use for video chat?: Yes   Food Insecurity: Food Insecurity Present    Worried About Running Out of Food in the Last Year: Never true    Ran Out of Food in the Last Year: Often true   Tobacco Use: High Risk    Smoking Tobacco Use: Every Day    Smokeless Tobacco Use: Unknown    Passive Exposure: Not on file   Housing/Utilities: High Risk    Within the past 12 months, have you ever stayed: outside, in a car, in a tent, in an overnight shelter, or temporarily in someone else's home (i.e. couch-surfing)?: No    Are you worried about losing your housing?: No    Within the past 12 months, have you been unable to get utilities (heat, electricity) when it was really needed?: Yes   Alcohol Use: Not on file   Transportation Needs: No Transportation Needs    Lack of Transportation (Medical): No    Lack of Transportation (Non-Medical): No   Substance Use: Not on file   Health Literacy: Low Risk     : Never   Physical Activity: Not on file   Interpersonal Safety: Not on file   Stress: Not on file   Intimate Partner Violence: Not on file   Depression: Not on file   Social Connections: Not on file     Would you be willing to receive help with any of the needs that you have identified today? Not applicable     SHIPPING     Specialty Medication(s) to be Shipped:   Hematology/Oncology: Norma Green    Other medication(s) to be shipped: No additional medications requested for fill at this time     Changes to insurance: No    Delivery Scheduled: Yes, Expected medication delivery date: 02/19/22.     Medication will be delivered via Same Day Courier to the confirmed prescription address in [facility].    The patient will receive a drug information handout for each medication shipped and additional FDA Medication Guides as required.  Verified that patient has previously received a Conservation officer, historic buildings and a Surveyor, mining.    The patient or caregiver noted above participated in the development of this care plan and knows that they can request review of or adjustments to the care plan at any time.      All of the patient's questions and concerns have been addressed.    Breck Coons Shared [facility] Specialty Pharmacist

## 2022-02-17 ENCOUNTER — Ambulatory Visit: Admit: 2022-02-17 | Discharge: 2022-02-18 | Payer: PRIVATE HEALTH INSURANCE

## 2022-02-17 DIAGNOSIS — C787 Secondary malignant neoplasm of liver and intrahepatic bile duct: Principal | ICD-10-CM

## 2022-02-17 DIAGNOSIS — C259 Malignant neoplasm of pancreas, unspecified: Principal | ICD-10-CM

## 2022-02-17 MED ADMIN — iohexoL (OMNIPAQUE) 350 mg iodine/mL solution 100 mL: 100 mL | INTRAVENOUS | @ 13:00:00 | Stop: 2022-02-17

## 2022-02-17 MED ADMIN — heparin, porcine (PF) 100 unit/mL injection 500 Units: 500 [IU] | INTRAVENOUS | @ 13:00:00 | Stop: 2022-02-18

## 2022-02-17 NOTE — Unmapped (Signed)
Hi,     Norma Green contacted the [employer] requesting to speak with the care team of Norma Green to discuss:    Patient reports experiencing nausea while taking oxycodone. Reports that pain is poorly controlled due to oxycodone side effect. Requesting different prescription.    Please contact Mr. [name]  at [phone] .    Thank you,   Kelli Hope  [facility]   [phone]

## 2022-02-17 NOTE — Unmapped (Signed)
Body aches - feels uncomfortable in her bones.  Abd pain is much improved, but occasionally has discomfort.  Hasn't been using the oxy much because she didn't like the way - dizzy and nausea.  Has used tylenol, not sure it helps much.  Family massages her sometimes and she feels this actually does help some.  She inquires about CBD gummies - ok to use, but we cannot prescribe.  Will hold off on new narcotic prescription right now - sounds like cancer related pain much improved.  She will stick with tylenol, can try 1-2 doses of ibuprofen (200-400mg  daily - reviewed conservative use).  She agrees with plan- she will let us know if this does not help.

## 2022-02-19 MED FILL — UDENYCA 6 MG/0.6 ML SUBCUTANEOUS SYRINGE: 28 days supply | Qty: 1.2 | Fill #1

## 2022-02-19 NOTE — Unmapped (Signed)
Hi,     Norma Green contacted the [employer] regarding the following:    - States that he is calling to discuss udenyca injection states that he is wanting to know how they will get more for her next chemo    Please contact Norma Green at [phone].    Thanks in advance,    [name]  [facility] Communication Center   [phone]

## 2022-02-20 NOTE — Unmapped (Unsigned)
[facility]     REFERRING PROVIDER:   Jaci Carrel, Anp  [address]  [id number]  Physician [place],  [place] [address]    PRIMARY CARE PROVIDER:  Ollen Barges, MD  [address]  [place] [address]    CONSULTING PROVIDERS  [facility] Team    ____________________________________________________________________    CANCER HISTORY  Diagnosis: Pancreatic Adenocarcinoma, imaging concerning for metastatic disease to liver  1. 12/12/21: Presented with abdominal pain, weight loss found to have CBD obstruction with both pancreatic head and tail mass along with liver lesion. EUS/ERCP with CBD stent placement.  Biopsy of pancreatic tail mass with adenocarcinoma. Concern for metastatic disease with 1 cm liver lesion.  Also, local extension to left adrenal.   2. 5/4 start FOLFIRINOX (Cy 1 FOLFOX)    Molecular:  Somatic: KRAS, TP53, CCND3, TFEB, VEGFA, TMP 6.3 m/MB  Germline: negative   ____________________________________________________________________    ASSESSMENT  1. 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 Cancer associated abdominal pain - improving  3. Opioid Induced Constipation   4. Weight Loss  5. Chemotherapy induced neutropenia - managed with GCSF    RECOMMENDATIONS  1. mFOLIRINOX (add irinotecan for C2)  -GCSF   2. Oxycodone 5mg  every 4 hours prn  3. Continue Miralax and Senna - increase senna to bid prn  4. Scheduled dex x 3 days post chemo, zofran, compazine prn  5. RTC 2 weeks for treatment, 4 weeks for labs, visit and treatment.    DISCUSSION  Gem     Decision-making today was high complexity on the basis of discussion of cancer, and weighing use of high risk chemotherapy, toxicity and complications/comorbidity of metastatic pancreatic cancer.  ______________________________________________________________________  HISTORY     Norma Green is seen today at the [facility] for ongoing management regarding pancreatic adenocarcinoma.     Struggled with nausea for about 2-3 days, didn't take dex.  Took zofran, at first didn't take compazine, but then took it and it helped.  Still struggles with constipation, taking miralax daily - doesn't feel helps that well.  Overall she and her family think that she is eating better.  Weight essentially stable.  Abd pain improving.  +cold sensitivity.     MEDICAL HISTORY  1. HTN  2. Anxiety    Allergies   Allergen Reactions    Metronidazole Nausea And Vomiting     Medications reviewed in the EMR    FAMILY HISTORY  Family history  Number of children: 2  History of cancer in children (yes/no; if yes, what type AND age of diagnosis): no  Number of siblings: 6 (5 living)  History of cancer in siblings or parents (yes/no; if yes, provide relation, type of cancer, AND age of diagnosis): sister with breast cancer (early stage)  History of cancer in maternal aunts/uncles/grandparents (yes/no; if yes, provide relation, type of cancer, AND age of diagnosis): MGF prostate, uncle with colon  History of cancer in paternal aunts/uncles/grandparents (yes/no; if yes, provide relation, type of cancer, AND age of diagnosis): Aunt with stomach cancer    SOCIAL HISTORY  Lives with family including husband. Smoker. No alcohol or drug use.  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     REVIEW OF SYSTEMS  The remainder of a comprehensive 10 systems review is negative.    PHYSICAL EXAM  There were no vitals taken for  this visit.   GENERAL: well developed, well nourished, in no distress  PSYCH: full and appropriate range of affect with good insight and judgement  HEENT: NCAT, pupils equal, sclerae anicteric  EXT: warm and well perfused, no edema  SKIN: No rashes    OBJECTIVE DATA  LABS  Labs reviewed in emr  CBC, CMP reviewed     Nice decline in tumor marker from 2780 down to 511    RADIOLOGY RESULTS   CT scan impression:  Decrease in panc mass, stable liver lesions, stable left adrenal met

## 2022-02-21 ENCOUNTER — Ambulatory Visit: Admit: 2022-02-21 | Discharge: 2022-02-22 | Payer: PRIVATE HEALTH INSURANCE

## 2022-02-21 ENCOUNTER — Other Ambulatory Visit: Admit: 2022-02-21 | Discharge: 2022-02-22 | Payer: PRIVATE HEALTH INSURANCE

## 2022-02-21 DIAGNOSIS — C787 Secondary malignant neoplasm of liver and intrahepatic bile duct: Principal | ICD-10-CM

## 2022-02-21 DIAGNOSIS — C259 Malignant neoplasm of pancreas, unspecified: Principal | ICD-10-CM

## 2022-02-21 DIAGNOSIS — D701 Agranulocytosis secondary to cancer chemotherapy: Principal | ICD-10-CM

## 2022-02-21 DIAGNOSIS — Z09 Encounter for follow-up examination after completed treatment for conditions other than malignant neoplasm: Principal | ICD-10-CM

## 2022-02-21 DIAGNOSIS — T451X5A Adverse effect of antineoplastic and immunosuppressive drugs, initial encounter: Principal | ICD-10-CM

## 2022-02-21 LAB — COMPREHENSIVE METABOLIC PANEL
ALBUMIN: 3 g/dL — ABNORMAL LOW (ref 3.4–5.0)
ALKALINE PHOSPHATASE: 210 U/L — ABNORMAL HIGH (ref 46–116)
ALT (SGPT): 67 U/L — ABNORMAL HIGH (ref 10–49)
ANION GAP: 7 mmol/L (ref 5–14)
AST (SGOT): 42 U/L — ABNORMAL HIGH (ref <=34)
BILIRUBIN TOTAL: 0.4 mg/dL (ref 0.3–1.2)
BLOOD UREA NITROGEN: 12 mg/dL (ref 9–23)
BUN / CREAT RATIO: 20
CALCIUM: 8.6 mg/dL — ABNORMAL LOW (ref 8.7–10.4)
CHLORIDE: 105 mmol/L (ref 98–107)
CO2: 30 mmol/L (ref 20.0–31.0)
CREATININE: 0.6 mg/dL
EGFR CKD-EPI (2021) FEMALE: >90 mL/min/{1.73_m2} (ref >=60)
GLUCOSE RANDOM: 163 mg/dL (ref 70–179)
POTASSIUM: 3.2 mmol/L — ABNORMAL LOW (ref 3.5–5.1)
PROTEIN TOTAL: 5.9 g/dL (ref 5.7–8.2)
SODIUM: 142 mmol/L (ref 135–145)

## 2022-02-21 LAB — CBC W/ AUTO DIFF
BASOPHILS ABSOLUTE COUNT: 0.1 10*9/L (ref 0.0–0.1)
BASOPHILS RELATIVE PERCENT: 0.3 %
EOSINOPHILS ABSOLUTE COUNT: 0.1 10*9/L (ref 0.0–0.5)
EOSINOPHILS RELATIVE PERCENT: 0.3 %
HEMATOCRIT: 30.4 % — ABNORMAL LOW (ref 34.0–44.0)
HEMOGLOBIN: 10 g/dL — ABNORMAL LOW (ref 11.3–14.9)
LYMPHOCYTES ABSOLUTE COUNT: 2.2 10*9/L (ref 1.1–3.6)
LYMPHOCYTES RELATIVE PERCENT: 12.2 %
MEAN CORPUSCULAR HEMOGLOBIN CONC: 33 g/dL (ref 32.0–36.0)
MEAN CORPUSCULAR HEMOGLOBIN: 27.3 pg (ref 25.9–32.4)
MEAN CORPUSCULAR VOLUME: 82.7 fL (ref 77.6–95.7)
MEAN PLATELET VOLUME: 8 fL (ref 6.8–10.7)
MONOCYTES ABSOLUTE COUNT: 1.1 10*9/L — ABNORMAL HIGH (ref 0.3–0.8)
MONOCYTES RELATIVE PERCENT: 6.2 %
NEUTROPHILS ABSOLUTE COUNT: 14.8 10*9/L — ABNORMAL HIGH (ref 1.8–7.8)
NEUTROPHILS RELATIVE PERCENT: 81 %
PLATELET COUNT: 136 10*9/L — ABNORMAL LOW (ref 150–450)
RED BLOOD CELL COUNT: 3.67 10*12/L — ABNORMAL LOW (ref 3.95–5.13)
RED CELL DISTRIBUTION WIDTH: 15.2 % (ref 12.2–15.2)
WBC ADJUSTED: 18.2 10*9/L — ABNORMAL HIGH (ref 3.6–11.2)

## 2022-02-21 LAB — CANCER ANTIGEN 19-9: CA 19-9: 68.21 U/mL — ABNORMAL HIGH (ref 0–35)

## 2022-02-21 LAB — SLIDE REVIEW

## 2022-02-21 LAB — MAGNESIUM: MAGNESIUM: 1.5 mg/dL — ABNORMAL LOW (ref 1.6–2.6)

## 2022-02-21 MED ADMIN — atropine injection: .4 mg | INTRAVENOUS | @ 20:00:00

## 2022-02-21 MED ADMIN — dextrose 5 % infusion: 100 mL/h | INTRAVENOUS | @ 16:00:00

## 2022-02-21 MED ADMIN — fluorouracil (ADRUCIL) 4,000 mg in sodium chloride (NS) 0.9 % 46-hr infusion CADD: 2400 mg/m2 | INTRAVENOUS | @ 22:00:00

## 2022-02-21 MED ADMIN — oxaliplatin (ELOXATIN) 141.1 mg in dextrose 5 % 250 mL IVPB: 85 mg/m2 | INTRAVENOUS | @ 18:00:00 | Stop: 2022-02-21

## 2022-02-21 MED ADMIN — leucovorin 664 mg in dextrose 5 % 50 mL IVPB: 400 mg/m2 | INTRAVENOUS | @ 20:00:00 | Stop: 2022-02-21

## 2022-02-21 MED ADMIN — magnesium sulfate 2gm/50mL IVPB: 2 g | INTRAVENOUS | @ 16:00:00

## 2022-02-21 MED ADMIN — ondansetron (ZOFRAN) tablet 24 mg: 24 mg | ORAL | @ 16:00:00 | Stop: 2022-02-21

## 2022-02-21 MED ADMIN — irinotecan (CAMPTOSAR) 249 mg in dextrose 5 % 500 mL IVPB: 150 mg/m2 | INTRAVENOUS | @ 20:00:00 | Stop: 2022-02-21

## 2022-02-21 MED ADMIN — dexAMETHasone (DECADRON) tablet 20 mg: 20 mg | ORAL | @ 16:00:00 | Stop: 2022-02-21

## 2022-02-21 MED ADMIN — prochlorperazine (COMPAZINE) tablet 10 mg: 10 mg | ORAL | @ 16:00:00

## 2022-02-21 NOTE — Unmapped (Signed)
Monday - Friday from 8:00 am - 5:00 pm  Call [phone]  Or toll free [phone]  Ask to speak to the Triage Nurse   On Nights, Weekends, and Holidays  Call [phone]  Ask the operator to page the  Oncology Fellow on Call    RED ZONE Means: RED ZONE: Take action now!      You need to be seen right away  Symptoms are at a severe level of discomfort    Call 911 or go to your nearest  Hospital for help   - Bleeding that will not stop  - Hard to breathe  - New seizure - Chest pain  - Fall or passing out  -Thoughts of hurting   Yourself or others      Call 911 if you are going into the RED ZONE                  YELLOW ZONE Means:     This is not an all-inclusive list. Please call with any new or worsening symptom.  Call [phone] [After hours, weekends, and holidays you will reach a Nurse Triage service to best assist you.]  You can be seen by a provider the same day through our Same Day Acute Care for Patients with Cancer program.      YELLOW ZONE: Take action today     Symptoms are new or worsening  You are not within your goal range for:  - Pain  - Shortness of breath  - Bleeding (nose, urine, stool, wound)  - Feeling sick to your stomach and throwing up  - Mouth sores/pain in your mouth or throat  - Hard stool or very loose stools (increase in ostomy output)  - No urine for 12 hours  - Feeding tube or other catheter/tube issue  - Redness or pain at previous IV or port/catheter site  - Depressed or anxiety       - Swelling (leg, arm, abdomen, face, neck)  - Skin rash or skin changes  - Wound issues (redness, drainage, re-opened)  - Confusion  - Vision changes  - Fever >100.4 F, chills  - Worsening cough with mucus that is green, yellow, or bloody  - Pain or burning when going to the bathroom  - Home Infusion Pump Issue- call [phone]    Call your healthcare provider if you are going into the YELLOW ZONE      GREEN ZONE Means:  Your symptoms are under controls  Continue to take your medicine as ordered  Keep all visits to the provider GREEN ZONE: You are in control  No increase or worsening symptoms  Able to take your medicine  Able to drink and eat    For your best care and safety, please DO NOT use MyChart messages to report red or yellow symptoms. MyChart messages are only checked during weekday normal business hours. Please allow up to 3 business days for a reply.  Please use MyChart only for the following:  -Non-urgent medication refills, scheduling requests, or general questions.         [id number] Rev. 05/21/2021  Approved by Oncology Patient Education Committee          Lab on 02/21/2022   Component Date Value Ref Range Status    Sodium 02/21/2022 142  135 - 145 mmol/L Final    Potassium 02/21/2022 3.2 (L)  3.5 - 5.1 mmol/L Final    Chloride 02/21/2022 105  98 - 107 mmol/L Final  CO2 02/21/2022 30.0  20.0 - 31.0 mmol/L Final    Anion Gap 02/21/2022 7  5 - 14 mmol/L Final    BUN 02/21/2022 12  9 - 23 mg/dL Final    Creatinine 60/45/4098 0.60  0.60 - 0.80 mg/dL Final    BUN/Creatinine Ratio 02/21/2022 20   Final    eGFR CKD-EPI (2021) Female 02/21/2022 >90  >=60 mL/min/1.46m2 Final    eGFR calculated with CKD-EPI 2021 equation in accordance with [employer] and [employer] of Nephrology Task Force recommendations.    Glucose 02/21/2022 163  70 - 179 mg/dL Final    Calcium 11/91/4782 8.6 (L)  8.7 - 10.4 mg/dL Final    Albumin 95/62/1308 3.0 (L)  3.4 - 5.0 g/dL Final    Total Protein 02/21/2022 5.9  5.7 - 8.2 g/dL Final    Total Bilirubin 02/21/2022 0.4  0.3 - 1.2 mg/dL Final    AST 65/78/4696 42 (H)  <=34 U/L Final    ALT 02/21/2022 67 (H)  10 - 49 U/L Final    Alkaline Phosphatase 02/21/2022 210 (H)  46 - 116 U/L Final    Magnesium 02/21/2022 1.5 (L)  1.6 - 2.6 mg/dL Final    CA 29-5 28/41/3244 68.21 (H)  0 - 35 U/mL Final    WBC 02/21/2022 18.2 (H)  3.6 - 11.2 10*9/L Final    RBC 02/21/2022 3.67 (L)  3.95 - 5.13 10*12/L Final    HGB 02/21/2022 10.0 (L)  11.3 - 14.9 g/dL Final    HCT 08/27/7251 30.4 (L)  34.0 - 44.0 % Final    MCV 02/21/2022 82.7  77.6 - 95.7 fL Final    MCH 02/21/2022 27.3  25.9 - 32.4 pg Final    MCHC 02/21/2022 33.0  32.0 - 36.0 g/dL Final    RDW 66/44/0347 15.2  12.2 - 15.2 % Final    MPV 02/21/2022 8.0  6.8 - 10.7 fL Final    Platelet 02/21/2022 136 (L)  150 - 450 10*9/L Final    Neutrophils % 02/21/2022 81.0  % Final    Lymphocytes % 02/21/2022 12.2  % Final    Monocytes % 02/21/2022 6.2  % Final    Eosinophils % 02/21/2022 0.3  % Final    Basophils % 02/21/2022 0.3  % Final    Absolute Neutrophils 02/21/2022 14.8 (H)  1.8 - 7.8 10*9/L Final    Absolute Lymphocytes 02/21/2022 2.2  1.1 - 3.6 10*9/L Final    Absolute Monocytes 02/21/2022 1.1 (H)  0.3 - 0.8 10*9/L Final    Absolute Eosinophils 02/21/2022 0.1  0.0 - 0.5 10*9/L Final    Absolute Basophils 02/21/2022 0.1  0.0 - 0.1 10*9/L Final    Smear Review Comments 02/21/2022 See Comment (A)  Undefined Final    [id number] Slide reviewed.

## 2022-02-21 NOTE — Unmapped (Unsigned)
Port accessed.  CHG dressing applied.  Labs drawn & sent for analysis.  Port flushed & saline-locked.  To next appt.  Care provided by Arlie Solomons RN.

## 2022-02-21 NOTE — Unmapped (Signed)
12:15 = Pt arrived to treatment area for planned C5D1 FOLFIRINOX. Pt states feeling well and denies any neuropathy or complaints at this time. Pt labs reviewed and within parameters to treat along with Magnesium replacement order - pt aware and in agreement with plan. Pt pre-medicated per order.  17:40 = pt tolerated treatment well and Port remains accessed for CADD infusion pump Fluorouracil to complete Sunday 7/2. Pt given copy of AVS and aware to return to clinic Sunday 7/2 to have pump flushed and de-accessed at completion of CADD pump Fluorouracil. Pt ambulated with stable gait from clinic accompanied by family member.

## 2022-02-23 ENCOUNTER — Ambulatory Visit: Admit: 2022-02-23 | Discharge: 2022-02-24 | Payer: PRIVATE HEALTH INSURANCE

## 2022-02-23 MED ADMIN — heparin, porcine (PF) 100 unit/mL injection 500 Units: 500 [IU] | INTRAVENOUS | @ 19:00:00 | Stop: 2022-02-24

## 2022-02-23 NOTE — Unmapped (Signed)
No visits with results within 1 Day(s) from this visit.   Latest known visit with results is:   Lab on 02/21/2022   Component Date Value Ref Range Status    Sodium 02/21/2022 142  135 - 145 mmol/L Final    Potassium 02/21/2022 3.2 (L)  3.5 - 5.1 mmol/L Final    Chloride 02/21/2022 105  98 - 107 mmol/L Final    CO2 02/21/2022 30.0  20.0 - 31.0 mmol/L Final    Anion Gap 02/21/2022 7  5 - 14 mmol/L Final    BUN 02/21/2022 12  9 - 23 mg/dL Final    Creatinine 16/05/9603 0.60  0.60 - 0.80 mg/dL Final    BUN/Creatinine Ratio 02/21/2022 20   Final    eGFR CKD-EPI (2021) Female 02/21/2022 >90  >=60 mL/min/1.87m2 Final    eGFR calculated with CKD-EPI 2021 equation in accordance with [employer] and [employer] of Nephrology Task Force recommendations.    Glucose 02/21/2022 163  70 - 179 mg/dL Final    Calcium 54/04/8118 8.6 (L)  8.7 - 10.4 mg/dL Final    Albumin 14/78/2956 3.0 (L)  3.4 - 5.0 g/dL Final    Total Protein 02/21/2022 5.9  5.7 - 8.2 g/dL Final    Total Bilirubin 02/21/2022 0.4  0.3 - 1.2 mg/dL Final    AST 21/30/8657 42 (H)  <=34 U/L Final    ALT 02/21/2022 67 (H)  10 - 49 U/L Final    Alkaline Phosphatase 02/21/2022 210 (H)  46 - 116 U/L Final    Magnesium 02/21/2022 1.5 (L)  1.6 - 2.6 mg/dL Final    CA 84-6 96/29/5284 68.21 (H)  0 - 35 U/mL Final    WBC 02/21/2022 18.2 (H)  3.6 - 11.2 10*9/L Final    RBC 02/21/2022 3.67 (L)  3.95 - 5.13 10*12/L Final    HGB 02/21/2022 10.0 (L)  11.3 - 14.9 g/dL Final    HCT 13/24/4010 30.4 (L)  34.0 - 44.0 % Final    MCV 02/21/2022 82.7  77.6 - 95.7 fL Final    MCH 02/21/2022 27.3  25.9 - 32.4 pg Final    MCHC 02/21/2022 33.0  32.0 - 36.0 g/dL Final    RDW 27/25/3664 15.2  12.2 - 15.2 % Final    MPV 02/21/2022 8.0  6.8 - 10.7 fL Final    Platelet 02/21/2022 136 (L)  150 - 450 10*9/L Final    Neutrophils % 02/21/2022 81.0  % Final    Lymphocytes % 02/21/2022 12.2  % Final    Monocytes % 02/21/2022 6.2  % Final    Eosinophils % 02/21/2022 0.3  % Final Basophils % 02/21/2022 0.3  % Final    Absolute Neutrophils 02/21/2022 14.8 (H)  1.8 - 7.8 10*9/L Final    Absolute Lymphocytes 02/21/2022 2.2  1.1 - 3.6 10*9/L Final    Absolute Monocytes 02/21/2022 1.1 (H)  0.3 - 0.8 10*9/L Final    Absolute Eosinophils 02/21/2022 0.1  0.0 - 0.5 10*9/L Final    Absolute Basophils 02/21/2022 0.1  0.0 - 0.1 10*9/L Final    Smear Review Comments 02/21/2022 See Comment (A)  Undefined Final    [id number] Slide reviewed.

## 2022-02-23 NOTE — Unmapped (Signed)
Patient completed and tolerated treatment. Port de-accessed after 500 unit Heparin flush, site covered with band-aid dressing. AVS declined. Pump given back to patient to bring for her next treatment. Pt discharged to home, NAD.

## 2022-02-24 DIAGNOSIS — T451X5A Adverse effect of antineoplastic and immunosuppressive drugs, initial encounter: Principal | ICD-10-CM

## 2022-02-24 DIAGNOSIS — C259 Malignant neoplasm of pancreas, unspecified: Principal | ICD-10-CM

## 2022-02-24 DIAGNOSIS — D701 Agranulocytosis secondary to cancer chemotherapy: Principal | ICD-10-CM

## 2022-02-24 MED ORDER — PEGFILGRASTIM-CBQV 6 MG/0.6 ML SUBCUTANEOUS AUTO-INJECTOR
3 refills | 0 days | Status: CP
Start: 2022-02-24 — End: ?

## 2022-02-24 NOTE — Unmapped (Signed)
Per [facility] notification, given supply concerns associated with Udenyca pre-filled syringe for manual use, prescription routed to [facility] to switch to [employer] to begin benefits investigation. [facility] to follow up on onboarding and transition to new product as appropriate.    Care coordination: 5 minutes

## 2022-02-25 DIAGNOSIS — C259 Malignant neoplasm of pancreas, unspecified: Principal | ICD-10-CM

## 2022-02-25 DIAGNOSIS — D701 Agranulocytosis secondary to cancer chemotherapy: Principal | ICD-10-CM

## 2022-02-25 DIAGNOSIS — T451X5A Adverse effect of antineoplastic and immunosuppressive drugs, initial encounter: Principal | ICD-10-CM

## 2022-03-07 ENCOUNTER — Ambulatory Visit: Admit: 2022-03-07 | Discharge: 2022-03-08 | Payer: PRIVATE HEALTH INSURANCE

## 2022-03-07 ENCOUNTER — Ambulatory Visit
Admit: 2022-03-07 | Discharge: 2022-03-08 | Payer: PRIVATE HEALTH INSURANCE | Attending: Registered" | Primary: Registered"

## 2022-03-07 DIAGNOSIS — C259 Malignant neoplasm of pancreas, unspecified: Principal | ICD-10-CM

## 2022-03-07 LAB — COMPREHENSIVE METABOLIC PANEL
ALBUMIN: 2.9 g/dL — ABNORMAL LOW (ref 3.4–5.0)
ALKALINE PHOSPHATASE: 179 U/L — ABNORMAL HIGH (ref 46–116)
ALT (SGPT): 61 U/L — ABNORMAL HIGH (ref 10–49)
ANION GAP: 6 mmol/L (ref 5–14)
AST (SGOT): 43 U/L — ABNORMAL HIGH (ref <=34)
BILIRUBIN TOTAL: 0.3 mg/dL (ref 0.3–1.2)
BLOOD UREA NITROGEN: 9 mg/dL (ref 9–23)
BUN / CREAT RATIO: 20
CALCIUM: 8.6 mg/dL — ABNORMAL LOW (ref 8.7–10.4)
CHLORIDE: 107 mmol/L (ref 98–107)
CO2: 29 mmol/L (ref 20.0–31.0)
CREATININE: 0.46 mg/dL — ABNORMAL LOW
EGFR CKD-EPI (2021) FEMALE: >90 mL/min/{1.73_m2} (ref >=60)
GLUCOSE RANDOM: 182 mg/dL — ABNORMAL HIGH (ref 70–179)
POTASSIUM: 4 mmol/L (ref 3.4–4.8)
PROTEIN TOTAL: 6 g/dL (ref 5.7–8.2)
SODIUM: 142 mmol/L (ref 135–145)

## 2022-03-07 LAB — CBC W/ AUTO DIFF
BASOPHILS ABSOLUTE COUNT: 0 10*9/L (ref 0.0–0.1)
BASOPHILS RELATIVE PERCENT: 0.4 %
EOSINOPHILS ABSOLUTE COUNT: 0 10*9/L (ref 0.0–0.5)
EOSINOPHILS RELATIVE PERCENT: 0.6 %
HEMATOCRIT: 28.3 % — ABNORMAL LOW (ref 34.0–44.0)
HEMOGLOBIN: 9.5 g/dL — ABNORMAL LOW (ref 11.3–14.9)
LYMPHOCYTES ABSOLUTE COUNT: 1.7 10*9/L (ref 1.1–3.6)
LYMPHOCYTES RELATIVE PERCENT: 19.6 %
MEAN CORPUSCULAR HEMOGLOBIN CONC: 33.5 g/dL (ref 32.0–36.0)
MEAN CORPUSCULAR HEMOGLOBIN: 29 pg (ref 25.9–32.4)
MEAN CORPUSCULAR VOLUME: 86.5 fL (ref 77.6–95.7)
MEAN PLATELET VOLUME: 8.3 fL (ref 6.8–10.7)
MONOCYTES ABSOLUTE COUNT: 0.7 10*9/L (ref 0.3–0.8)
MONOCYTES RELATIVE PERCENT: 8.4 %
NEUTROPHILS ABSOLUTE COUNT: 6.1 10*9/L (ref 1.8–7.8)
NEUTROPHILS RELATIVE PERCENT: 71 %
PLATELET COUNT: 87 10*9/L — ABNORMAL LOW (ref 150–450)
RED BLOOD CELL COUNT: 3.27 10*12/L — ABNORMAL LOW (ref 3.95–5.13)
RED CELL DISTRIBUTION WIDTH: 21.4 % — ABNORMAL HIGH (ref 12.2–15.2)
WBC ADJUSTED: 8.5 10*9/L (ref 3.6–11.2)

## 2022-03-07 LAB — SLIDE REVIEW

## 2022-03-07 LAB — MAGNESIUM: MAGNESIUM: 1.6 mg/dL (ref 1.6–2.6)

## 2022-03-07 MED ORDER — OXYCODONE 5 MG TABLET
ORAL_TABLET | Freq: Four times a day (QID) | ORAL | 0 refills | 23 days | Status: CP | PRN
Start: 2022-03-07 — End: 2022-04-06

## 2022-03-07 MED ADMIN — oxaliplatin (ELOXATIN) 141.1 mg in dextrose 5 % 250 mL IVPB: 85 mg/m2 | INTRAVENOUS | @ 15:00:00 | Stop: 2022-03-07

## 2022-03-07 MED ADMIN — ondansetron (ZOFRAN) tablet 24 mg: 24 mg | ORAL | @ 15:00:00 | Stop: 2022-03-07

## 2022-03-07 MED ADMIN — fluorouracil (ADRUCIL) 4,000 mg in sodium chloride (NS) 0.9 % 46-hr infusion CADD: 2400 mg/m2 | INTRAVENOUS | @ 20:00:00

## 2022-03-07 MED ADMIN — atropine injection: .4 mg | INTRAVENOUS | @ 18:00:00

## 2022-03-07 MED ADMIN — irinotecan (CAMPTOSAR) 249 mg in dextrose 5 % 500 mL IVPB: 150 mg/m2 | INTRAVENOUS | @ 18:00:00 | Stop: 2022-03-07

## 2022-03-07 MED ADMIN — dexAMETHasone (DECADRON) tablet 20 mg: 20 mg | ORAL | @ 15:00:00 | Stop: 2022-03-07

## 2022-03-07 MED ADMIN — dextrose 5 % infusion: 100 mL/h | INTRAVENOUS | @ 15:00:00

## 2022-03-07 MED ADMIN — leucovorin 664 mg in dextrose 5 % 50 mL IVPB: 400 mg/m2 | INTRAVENOUS | @ 18:00:00 | Stop: 2022-03-07

## 2022-03-07 NOTE — Unmapped (Signed)
Hi,     Norma Green contacted the Communication Center regarding the following:    - States that she is wanting to see if Dr. Forbes Cellar will send in a prescription for pain medication    Please contact [name] at [phone].    Thanks in advance,    [name]  [facility] Communication [facility]   [phone]

## 2022-03-07 NOTE — Unmapped (Signed)
Hospital Outpatient Visit on 03/07/2022   Component Date Value Ref Range Status    Sodium 03/07/2022 142  135 - 145 mmol/L Final    Potassium 03/07/2022 4.0  3.4 - 4.8 mmol/L Final    Chloride 03/07/2022 107  98 - 107 mmol/L Final    CO2 03/07/2022 29.0  20.0 - 31.0 mmol/L Final    Anion Gap 03/07/2022 6  5 - 14 mmol/L Final    BUN 03/07/2022 9  9 - 23 mg/dL Final    Creatinine 01/60/1093 0.46 (L)  0.60 - 0.80 mg/dL Final    BUN/Creatinine Ratio 03/07/2022 20   Final    eGFR CKD-EPI (2021) Female 03/07/2022 >90  >=60 mL/min/1.58m2 Final    eGFR calculated with CKD-EPI 2021 equation in accordance with [employer] and [employer] of Nephrology Task Force recommendations.    Glucose 03/07/2022 182 (H)  70 - 179 mg/dL Final    Calcium 23/55/7322 8.6 (L)  8.7 - 10.4 mg/dL Final    Albumin 02/54/2706 2.9 (L)  3.4 - 5.0 g/dL Final    Total Protein 03/07/2022 6.0  5.7 - 8.2 g/dL Final    Total Bilirubin 03/07/2022 0.3  0.3 - 1.2 mg/dL Final    AST 23/76/2831 43 (H)  <=34 U/L Final    ALT 03/07/2022 61 (H)  10 - 49 U/L Final    Alkaline Phosphatase 03/07/2022 179 (H)  46 - 116 U/L Final    Magnesium 03/07/2022 1.6  1.6 - 2.6 mg/dL Final    WBC 51/76/1607 8.5  3.6 - 11.2 10*9/L Final    RBC 03/07/2022 3.27 (L)  3.95 - 5.13 10*12/L Final    HGB 03/07/2022 9.5 (L)  11.3 - 14.9 g/dL Final    HCT 37/05/6268 28.3 (L)  34.0 - 44.0 % Final    MCV 03/07/2022 86.5  77.6 - 95.7 fL Final    MCH 03/07/2022 29.0  25.9 - 32.4 pg Final    MCHC 03/07/2022 33.5  32.0 - 36.0 g/dL Final    RDW 48/54/6270 21.4 (H)  12.2 - 15.2 % Final    MPV 03/07/2022 8.3  6.8 - 10.7 fL Final    Platelet 03/07/2022 87 (L)  150 - 450 10*9/L Final    Neutrophils % 03/07/2022 71.0  % Final    Lymphocytes % 03/07/2022 19.6  % Final    Monocytes % 03/07/2022 8.4  % Final    Eosinophils % 03/07/2022 0.6  % Final    Basophils % 03/07/2022 0.4  % Final    Absolute Neutrophils 03/07/2022 6.1  1.8 - 7.8 10*9/L Final    Absolute Lymphocytes 03/07/2022 1.7  1.1 - 3.6 10*9/L Final    Absolute Monocytes 03/07/2022 0.7  0.3 - 0.8 10*9/L Final    Absolute Eosinophils 03/07/2022 0.0  0.0 - 0.5 10*9/L Final    Absolute Basophils 03/07/2022 0.0  0.0 - 0.1 10*9/L Final    Anisocytosis 03/07/2022 Moderate (A)  Not Present Final          RED ZONE Means: RED ZONE: Take action now!     You need to be seen right away  Symptoms are at a severe level of discomfort    Call 911 or go to your nearest  Hospital for help     - Bleeding that will not stop    - Hard to breathe    - New seizure - Chest pain  - Fall or passing out  -Thoughts of hurting  yourself or others      Call 911 if you are going into the RED ZONE                  YELLOW ZONE Means:     Please call with any new or worsening symptom(s), even if not on this list.  Call [phone]  After hours, weekends, and holidays - you will reach a long recording with specific instructions, If not in an emergency such as above, please listen closely all the way to the end and choose the option that relates to your need.   You can be seen by a provider the same day through our Same Day Acute Care for Patients with Cancer program.      YELLOW ZONE: Take action today     Symptoms are new or worsening  You are not within your goal range for:    - Pain    - Shortness of breath    - Bleeding (nose, urine, stool, wound)    - Feeling sick to your stomach and throwing up    - Mouth sores/pain in your mouth or throat    - Hard stool or very loose stools (increase in       ostomy output)    - No urine for 12 hours    - Feeding tube or other catheter/tube issue    - Redness or pain at previous IV or port/catheter site    - Depressed or anxiety   - Swelling (leg, arm, abdomen,     face, neck)  - Skin rash or skin changes  - Wound issues (redness, drainage,    re-opened)  - Confusion  - Vision changes  - Fever >100.4 F or chills  - Worsening cough with mucus that is    green, yellow, or bloody  - Pain or burning when going to the bathroom  - Home Infusion Pump Issue- call    [phone]         Call your healthcare provider if you are going into the YELLOW ZONE     GREEN ZONE Means:  Your symptoms are under controls  Continue to take your medicine as ordered  Keep all visits to the provider GREEN ZONE: You are in control  No increase or worsening symptoms  Able to take your medicine  Able to drink and eat    - DO NOT use MyChart messages to report red or yellow symptoms. Allow up to 3    business days for a reply.  -MyChart is for non-urgent medication refills, scheduling requests, or other general questions.         [id number] Rev. 02/21/2022  Approved by Oncology Patient Education Committee

## 2022-03-07 NOTE — Unmapped (Signed)
Pt received message that pt had questions regarding restarting her oxy. Called pt and left message. Encouraged her to call back with any questions/concerns.

## 2022-03-08 NOTE — Unmapped (Signed)
Returned call to pt.States she is requesting a refill of oxycodone 5 mg.States she would like it sent to [facility] in [place].States she has left side pain that comes and goes.    Let her know I will relay the message to her team.Asked that she call back with any further needs.

## 2022-03-08 NOTE — Unmapped (Signed)
Patient presented for oxaliplatin, irinotecan, and leucovorin infusion, as well as be set up for a home infusion of fluorouracil. Patient denied any pain, shortness of breath or nausea during treatment; no s/s of reaction noted. Patient tolerated treatment well and was stable at time of discharge; self-ambulated to lobby with family.

## 2022-03-09 ENCOUNTER — Ambulatory Visit
Admit: 2022-03-09 | Discharge: 2022-03-09 | Disposition: A | Discharge status: Other Acute Inpatient Hospital | Payer: PRIVATE HEALTH INSURANCE

## 2022-03-09 NOTE — Unmapped (Signed)
Called Mrs.Lorence about missed appointment today. She told me that she called  because it kept popping out and the nurse told her to come in so it wouldn't get infected. She said she come to the hospital around 6 am and they disconnected it.

## 2022-03-09 NOTE — Unmapped (Signed)
Upcoming Appt:  Future Appointments   Date Time Provider Department Center   03/09/2022  1:00 PM ONCINF CHAIR 10 [facility]   03/20/2022  8:00 AM Roni Bread, MD [facility]   03/21/2022  7:45 AM ONCDEV CHAIR 49 [facility]   04/11/2022  8:30 AM ONCINF CHAIR 15 [id number]   04/16/2022 10:20 AM [facility] RM 1 [facility] [place]       Recent:   What is the date of your last related visit?  03/07/22  Related acute medications Rx'd:  Ca tx  Home treatment tried:  taped line back      Relevant:   Allergies: Metronidazole  Medications: Ca regime   Health History: pancreatic Ca  Weight: n/a      [place]/ Cancer patients only:  What was the date of your last cancer treatment (mm/dd/yy)?: 03/07/22  Was the treatment oral or infusion?: infusion  Are you currently on TVEC (yes/no)?: no  Reason for Disposition   General information question, no triage required and triager able to answer question    Answer Assessment - Initial Assessment Questions  1. REASON FOR CALL or QUESTION: What is your reason for calling today? or How can I best help you? or What question do you have that I can help answer?      Pt has a port for IV Ca tx. Line has become disconnected from the port a couple times. Dropped on blanket. Husband tried to reattach/tape connection. Is no longer sterile.  Will go to local ED now.    Protocols used: Information Only Call - No Triage-A-AH

## 2022-03-10 DIAGNOSIS — D701 Agranulocytosis secondary to cancer chemotherapy: Principal | ICD-10-CM

## 2022-03-10 DIAGNOSIS — C259 Malignant neoplasm of pancreas, unspecified: Principal | ICD-10-CM

## 2022-03-10 DIAGNOSIS — T451X5A Adverse effect of antineoplastic and immunosuppressive drugs, initial encounter: Principal | ICD-10-CM

## 2022-03-10 MED ORDER — UDENYCA 6 MG/0.6 ML SUBCUTANEOUS SYRINGE
5 refills | 0 days
Start: 2022-03-10 — End: ?

## 2022-03-10 NOTE — Unmapped (Signed)
RN Navigator contacted pt to check in. Pt requested oxycodone refill on Friday and wanted to follow up on symptoms    Pt reports that she had some nausea after Friday's treatment but relieved with antiemetics.     Able to eat and drink.     No diarrhea after this cycle. REports regular BM. Had 1 slightly hard BM yesterday with some straining. Encouraged colace bid. Asked her to call if symptoms worsen or do not improve.     Pt has not picked up oxycodone rx yet. Said that Tylenol no longer covering back/side pain. WIll try oxy and call with any future needs.

## 2022-03-10 NOTE — Unmapped (Signed)
Please refill if appropriate.     Most Recent Clinic Visit:Visit date not found  Next Clinic Visit: Visit date not found

## 2022-03-10 NOTE — Unmapped (Signed)
[facility] Refill Coordination Note    Specialty Medication(s) to be Shipped:   Hematology/Oncology: Norma Green    Other medication(s) to be shipped: No additional medications requested for fill at this time     Norma Green, DOB: [birth year]  Phone: [phone] (home)       All above HIPAA information was verified with patient.     Was a Nurse, learning disability used for this call? No    Completed refill call assessment today to schedule patient's medication shipment from the [facility] [phone]).  All relevant notes have been reviewed.     Specialty medication(s) and dose(s) confirmed: Regimen is correct and unchanged.   Changes to medications: Norma Green reports no changes at this time.  Changes to insurance: No  New side effects reported not previously addressed with a pharmacist or physician: None reported  Questions for the pharmacist: No    Confirmed patient received a Conservation officer, historic buildings and a Surveyor, mining with first shipment. The patient will receive a drug information handout for each medication shipped and additional FDA Medication Guides as required.       DISEASE/MEDICATION-SPECIFIC INFORMATION        For patients on injectable medications: Patient currently has 0 doses left.  Next injection is scheduled for ~03/25/22.    SPECIALTY MEDICATION ADHERENCE     Medication Adherence    Patient reported X missed doses in the last month: 0  Specialty Medication: Udenyca 6mg /0.38mL  Patient is on additional specialty medications: No  Informant: patient              Were doses missed due to medication being on hold? No    Udenyca  : 14 days of medicine on hand       REFERRAL TO PHARMACIST     Referral to the pharmacist: Not needed      [facility]     Shipping address confirmed in Epic.     Delivery Scheduled: Yes, Expected medication delivery date: 03/14/22.  However, Rx request for refills was sent to the provider as there are none remaining.     Medication will be delivered via Same Day Courier to the prescription address in Epic WAM.    Norma Green   [facility] Specialty Pharmacist

## 2022-03-11 MED ORDER — UDENYCA 6 MG/0.6 ML SUBCUTANEOUS SYRINGE
SUBCUTANEOUS | 5 refills | 28 days | Status: CP
Start: 2022-03-11 — End: ?
  Filled 2022-03-14: qty 1.2, 28d supply, fill #0

## 2022-03-11 NOTE — Unmapped (Signed)
Clinical Assessment Needed For: Formulation Change  Medication: Tillie Fantasia Date/Day Supply: 6/28 / 28  Prior Authorization Required  Was previous dose already scheduled to fill: Yes    Notes to Pharmacist:

## 2022-03-12 NOTE — Unmapped (Signed)
[facility]patient Nutrition Services   Medical Nutrition Therapy Consultation       Visit Type:    Return Assessment    Referral Reason:   Pancreatic cancer, wt loss and supplement recommendations      Norma Green is a 61 y.o. 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 Receiving FOLFIRINOX. Her active problem list, medication list, allergies, family history, social history, notes from last a few encounters, and lab results were reviewed.     Past Medical History:   Diagnosis Date   ??? Allergic rhinitis 12/30/2007   ??? Hypertension    ??? Murmur, heart    ??? Sleep related leg cramps 11/03/2011   ??? Tobacco use disorder 11/03/2011     Anthropometrics   Height: 170.2 cm  Current Weight: 60.8 kg  BMI: 21 kg/m2  Ideal Body Weight: 61.4 kg (Hamwi Method)  Percent Ideal Body Weight: 99%  Recent wt change: 3 kg wt gain in 2 weeks  % wt change: 5% wt gain in 2 weeks    Weight history:  Wt Readings from Last 6 Encounters:   03/07/22 60.8 kg (134 lb 0.6 oz)   02/21/22 57.8 kg (127 lb 8 oz)   02/07/22 58.1 kg (128 lb 1.6 oz)   01/24/22 58.1 kg (128 lb 1.6 oz)   01/10/22 58.7 kg (129 lb 4.8 oz)   12/26/21 60.3 kg (133 lb)       Nutrition Risk Screening:   Food Insecurity: Food Insecurity Present   ??? Worried About Programme researcher, broadcasting/film/video in the Last Year: Never true   ??? Ran Out of Food in the Last Year: Often true      Physical Activity:  Physical activity level is light with some exercise.     ASPEN/AND Malnutrition Screening:   Pt meets ASPEN/AND criteria for malnutrition as noted below:  Malnutrition Designation: Non-severe (moderate) in the setting of chronic illness- status improving  -- Weight loss: none  -- Energy Intake: possibly meeting > 75% estimated energy needs >= 1 week  -- Muscle Mass: mild temporal wasting, mild interosseous wasting  -- Fat Mass: mild-moderate orbital wasting  -- Fluid Accumulation: did not assess  -- Functional Assessment: did not assess    Biochemical Data, Medical Tests and Procedures:  All pertinent labs and imaging reviewed by Howie Ill at 10:00 AM 03/07/2022.    Medications and Vitamin/Mineral Supplementation:   All nutritionally pertinent medications reviewed on 03/07/2022.   She is taking nutrition supplements: [employer]'s chewable gummies X 1 daily    Current Outpatient Medications   Medication Sig Dispense Refill   ??? amLODIPine (NORVASC) 10 MG tablet TAKE ONE TABLET BY MOUTH NIGHTLY 30 tablet 0   ??? aspirin (ECOTRIN) 81 MG tablet Take 1 tablet (81 mg total) by mouth daily.     ??? dexAMETHasone (DECADRON) 4 MG tablet Take 2 tablets (8 mg total) by mouth daily. Take only on days 2, 3, and 4 of each 14-day chemotherapy cycle. 12 tablet 5   ??? heparin, porcine, PF, 100 unit/mL Syrg Infuse 5 mL into a venous catheter every fourteen (14) days. For use while patient is on fluorouracil (5-FU) home infusion.   Flush IV catheter with heparin 5 mL as directed after final saline flush per SASH method and as needed for line maintenance. 4 each PRN   ??? hydrochlorothiazide (HYDRODIURIL) 25 MG  tablet TAKE ONE TABLET BY MOUTH ONCE DAILY 30 tablet 0   ??? levoFLOXacin (LEVAQUIN) 750 MG tablet Take 1 tablet (750 mg total) by mouth daily. 3 tablet 0   ??? lidocaine-prilocaine (EMLA) 2.5-2.5 % cream Apply 30 minutes prior to port access. 5 g 3   ??? lisinopril (PRINIVIL,ZESTRIL) 40 MG tablet Take 1 tablet (40 mg total) by mouth daily. 30 tablet 11   ??? loperamide (IMODIUM) 2 mg capsule If having diarrhea, take 2 capsules (4 mg) after the first loose stool, then 1 capsule every 2 hours until diarrhea free for 12 hours. 60 capsule 11   ??? metoprolol succinate (TOPROL XL) 100 MG 24 hr tablet Take 1 tablet (100 mg total) by mouth daily. 30 tablet 11   ??? nicotine (NICODERM CQ) 7 mg/24 hr patch Place 1 patch on the skin daily. 28 patch 1   ??? ondansetron (ZOFRAN) 8 MG tablet Take 1 tablet (8 mg total) by mouth every eight (8) hours as needed for nausea. 30 tablet 2   ??? oxyCODONE (ROXICODONE) 5 MG immediate release tablet Take 1 tablet (5 mg total) by mouth every six (6) hours as needed. 90 tablet 0   ??? pegfilgrastim-cbqv (UDENYCA) 6 mg/0.6 mL injection Inject the contents of 1 syringe (6 mg) under the skin once on day 4 each chemotherapy cycle (24 hours after the completion of chemotherapy). 1.2 mL 5   ??? pegfilgrastim-cbqv 6 mg/0.6 mL AtIn Inject 1 Syringe under the skin once on day 4 each chemotherapy cycle (24 hours after the completion of chemotherapy). 1.2 mL 3   ??? polyethylene glycol (GLYCOLAX) 17 gram/dose powder Take 17 g by mouth daily. 255 g 11   ??? prochlorperazine (COMPAZINE) 10 MG tablet Take 1 tablet (10 mg total) by mouth every six (6) hours as needed (nausea). 30 tablet 2   ??? senna (SENOKOT) 8.6 mg tablet Take 1 tablet by mouth Two (2) times a day. 60 tablet 2   ??? sertraline (ZOLOFT) 50 MG tablet Take 1 tablet (50 mg total) by mouth daily. 30 tablet 11   ??? sodium chloride (NS) 0.9 % injection Infuse 10 mL into a venous catheter every fourteen (14) days. For use while patient is on fluorouracil (5-FU) home infusion.   Flush IV catheter with normal saline 10 mL prior to and after infusion followed by heparin flush per SASH method and as needed for line maintenance. 6 each PRN     No current facility-administered medications for this visit.       Nutrition History:     Dietary Restrictions: No known food allergies or food intolerances.     Gastrointestinal Issues: Nausea- managing with meds  Improved diarrhea.    Oral Issues: None reported    Hunger and Satiety: Early satiety.     Food Safety and Access: She expressed limited access to nutrition-related supplies- Ensure shakes.      Diet Recall:   Breakfast: oatmeal with fruit, grits with eggs, or pancakes  Lunch: soup or hot dog  Dinner: baked chicken/fish/pork chops with rice, broccoli, salad  Snack(s): fruit, peanut butter crackers, chips, cakes  Beverages: 4-5 cups cranberry or orange juice, ~20 oz water, some gingerale  Alcohol: none reported  Supplements: Original Ensure X 1-2 daily  Enteral feedings: No feeding tube    Pt voices good appetite; expresses concern regarding impact of added sugar on cancer. Receiving Ensure Original at no cost from therapist's office.  Nutrition Assessment     Improved po intake;  possible meeting > 75% estimated nutrition needs.     Oral nutrition supplements remain indicated.    May need to increase micronutrient supplementation based on po intake during treatment.    Daily Estimated Nutritional Needs:  Energy: 30-35 kcal/kg, actual body weight  Protein: 1.1-1.3 g/kg, actual body weight  Carbohydrate: no restriction  Fluid: 1 ml/kcal  Nutrition Goals & Evaluation    - Intake will aim to meet > 75% of estimated nutrient needs.  (Met)  - Aim for weight maintenance and/or gain.  (Met)  - Improve bowel regularity. (Progressing)  - Adequate hydration. (Met)      Nutrition goals reviewed, and relevant barriers identified and addressed: acuity of illness. She is evaluated to have good willingness and ability to achieve nutrition goals.   Nutrition Intervention      - Nutrition Counseling: Goal setting Problem solving  - Oral Nutrition Supplementation: Ensure Original or equivalent  - Vitamin/Mineral Supplementation: multivitamin    Nutrition Plan:   ??? Continue current meals/snacks with high protein foods.   ??? Discussed how added sugar does not directly impact cancer. As appetite may vary during course of treatment, do not advise dietary restrictions at this time.  ??? Recommend Original Ensure or equivalent (e.g. Equate) X 1-2 daily based on appetite. Instructed pt to consume between meals.   ??? Encouraged pt to consume 7-9 cups fluids daily. Will need additional cup for each loose stool.  ??? If intake lessens, recommend children's multivitamin gummy X 2 daily.      Materials Provided: None this visit      Follow up will occur as needed.       Food/Nutrition-related history, Anthropometric measurements, Biochemical data, medical tests, procedures, Nutrition-focused physical findings, Patient understanding or compliance with intervention and recommendations , and Effectiveness of nutrition interventions will be assessed at time of follow-up.     Recommendations for Clinical Team, Care Team , and Referring Provider :  - Encourage pt to follow nutrition plan as noted above.     Time spent 20 minutes

## 2022-03-19 NOTE — Unmapped (Signed)
[facility]     PRIMARY CARE PROVIDER:  Ollen Barges, MD  [address]  [place] [address]    CONSULTING PROVIDERS  [facility] Biliary Team  ____________________________________________________________________    CANCER HISTORY  Diagnosis: Pancreatic Adenocarcinoma, imaging concerning for metastatic disease to liver  1. 12/12/21: Presented with abdominal pain, weight loss found to have CBD obstruction with both pancreatic head and tail mass along with liver lesion. EUS/ERCP with CBD stent placement.  Biopsy of pancreatic tail mass with adenocarcinoma. Concern for metastatic disease with 1 cm liver lesion.  Also, local extension to left adrenal.   2. 5/4 start FOLFIRINOX (Cy 1 FOLFOX)    Molecular:  Somatic: KRAS, TP53, CCND3, TFEB, VEGFA, TMP 6.3 m/MB  Germline: negative   ____________________________________________________________________    ASSESSMENT  1. 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 Cancer associated abdominal pain - improving  3. Opioid Induced Constipation   4. Chemo neuropathy, grade 1, ctm  5. Chemotherapy induced neutropenia - managed with GCSF    RECOMMENDATIONS  1. mFOLIRINOX + GCSF support   2. Oxycodone 5mg  every 4 hours prn   3. Continue Miralax and Senna - increase senna to bid prn  4. Scheduled dex x 3 days post chemo, zofran, compazine prn  5. RTC 2 weeks for treatment, 4 weeks for scans, labs, visit and treatment. [Pump disconnect at ]    DISCUSSION   Doing really well. Reviewed excellent progress to date and good chemo tolerance. Anticipate worsening neuropathy over the course of next few cycles and that we may have to decrease/drop oxali, she will monitor and we'll drop pending change    Very mild URI symptoms whic hare improving--low risk for severe viral complication particularly with GCSF support. Ok for treatment tomorrow am    Decision-making today was high complexity on the basis of discussion of cancer, and weighing use of high risk chemotherapy, toxicity and complications/comorbidity of metastatic pancreatic cancer.  ______________________________________________________________________  HISTORY     Maribella L Borchard is seen today at the [facility] for ongoing management regarding pancreatic cancer.     Doing well-- grandbaby got URI from daycare and the past few days she has had a mild sore throat. No fever, cough or rhinnorhea. No myalgais. Able to eat and drink fine, not backing down on her energy/activity. She thinks throat is starting to get a bit better already.     + tingling in her fingers that is pretty persistent. Not painful, not interfering with function, none in toes.     Pain is better, not taking nearly as much pain meds    Eating really well and gained weight      MEDICAL HISTORY  1. HTN  2. Anxiety    Allergies   Allergen Reactions    Metronidazole Nausea And Vomiting     Medications reviewed in the EMR    SOCIAL HISTORY  Lives with family including husband. Smoker. No alcohol or drug use.  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 New grandbaby on the way 03/2022    PHYSICAL EXAM  There were no vitals taken for this visit.   Video-- looks awesome. So upbeat    OBJECTIVE DATA  LABS  Labs reviewed in emr     RADIOLOGY RESULTS   None today. Last 4 weeks ago with nice reduction in mass.       The patient reports they  are currently: at home. I spent 13 minutes on the real-time audio and video with the patient on the date of service. I spent an additional 14 minutes on pre- and post-visit activities on the date of service.     The patient was physically located in [place] or a state in which I am permitted to provide care. The patient and/or parent/guardian understood that s/he may incur co-pays and cost sharing, and agreed to the telemedicine visit. The visit was reasonable and appropriate under the circumstances given the patient's presentation at the time.    The patient and/or parent/guardian has been advised of the potential risks and limitations of this mode of treatment (including, but not limited to, the absence of in-person examination) and has agreed to be treated using telemedicine. The patient's/patient's family's questions regarding telemedicine have been answered.     If the visit was completed in an ambulatory setting, the patient and/or parent/guardian has also been advised to contact their provider???s office for worsening conditions, and seek emergency medical treatment and/or call 911 if the patient deems either necessary.

## 2022-03-20 ENCOUNTER — Telehealth
Admit: 2022-03-20 | Discharge: 2022-03-21 | Payer: PRIVATE HEALTH INSURANCE | Attending: Hematology & Oncology | Primary: Hematology & Oncology

## 2022-03-20 DIAGNOSIS — C259 Malignant neoplasm of pancreas, unspecified: Principal | ICD-10-CM

## 2022-03-20 DIAGNOSIS — T451X5A Adverse effect of antineoplastic and immunosuppressive drugs, initial encounter: Principal | ICD-10-CM

## 2022-03-20 DIAGNOSIS — G620 Drug-induced polyneuropathy: Principal | ICD-10-CM

## 2022-03-20 DIAGNOSIS — C787 Secondary malignant neoplasm of liver and intrahepatic bile duct: Principal | ICD-10-CM

## 2022-03-21 ENCOUNTER — Ambulatory Visit: Admit: 2022-03-21 | Discharge: 2022-03-22 | Payer: PRIVATE HEALTH INSURANCE

## 2022-03-21 LAB — COMPREHENSIVE METABOLIC PANEL
ALBUMIN: 2.9 g/dL — ABNORMAL LOW (ref 3.4–5.0)
ALKALINE PHOSPHATASE: 173 U/L — ABNORMAL HIGH (ref 46–116)
ALT (SGPT): 42 U/L (ref 10–49)
ANION GAP: 6 mmol/L (ref 5–14)
AST (SGOT): 29 U/L (ref <=34)
BILIRUBIN TOTAL: 0.4 mg/dL (ref 0.3–1.2)
BLOOD UREA NITROGEN: 12 mg/dL (ref 9–23)
BUN / CREAT RATIO: 18
CALCIUM: 8.7 mg/dL (ref 8.7–10.4)
CHLORIDE: 108 mmol/L — ABNORMAL HIGH (ref 98–107)
CO2: 28 mmol/L (ref 20.0–31.0)
CREATININE: 0.68 mg/dL
EGFR CKD-EPI (2021) FEMALE: >90 mL/min/{1.73_m2} (ref >=60)
GLUCOSE RANDOM: 138 mg/dL (ref 70–179)
POTASSIUM: 3.8 mmol/L (ref 3.5–5.1)
PROTEIN TOTAL: 6 g/dL (ref 5.7–8.2)
SODIUM: 142 mmol/L (ref 135–145)

## 2022-03-21 LAB — CBC W/ AUTO DIFF
BASOPHILS ABSOLUTE COUNT: 0 10*9/L (ref 0.0–0.1)
BASOPHILS RELATIVE PERCENT: 0.3 %
EOSINOPHILS ABSOLUTE COUNT: 0 10*9/L (ref 0.0–0.5)
EOSINOPHILS RELATIVE PERCENT: 0.6 %
HEMATOCRIT: 28.5 % — ABNORMAL LOW (ref 34.0–44.0)
HEMOGLOBIN: 9.4 g/dL — ABNORMAL LOW (ref 11.3–14.9)
LYMPHOCYTES ABSOLUTE COUNT: 1.3 10*9/L (ref 1.1–3.6)
LYMPHOCYTES RELATIVE PERCENT: 18.6 %
MEAN CORPUSCULAR HEMOGLOBIN CONC: 33 g/dL (ref 32.0–36.0)
MEAN CORPUSCULAR HEMOGLOBIN: 29.1 pg (ref 25.9–32.4)
MEAN CORPUSCULAR VOLUME: 88.1 fL (ref 77.6–95.7)
MEAN PLATELET VOLUME: 8.1 fL (ref 6.8–10.7)
MONOCYTES ABSOLUTE COUNT: 0.7 10*9/L (ref 0.3–0.8)
MONOCYTES RELATIVE PERCENT: 9.3 %
NEUTROPHILS ABSOLUTE COUNT: 5.1 10*9/L (ref 1.8–7.8)
NEUTROPHILS RELATIVE PERCENT: 71.2 %
PLATELET COUNT: 122 10*9/L — ABNORMAL LOW (ref 150–450)
RED BLOOD CELL COUNT: 3.24 10*12/L — ABNORMAL LOW (ref 3.95–5.13)
RED CELL DISTRIBUTION WIDTH: 21.6 % — ABNORMAL HIGH (ref 12.2–15.2)
WBC ADJUSTED: 7.2 10*9/L (ref 3.6–11.2)

## 2022-03-21 LAB — MAGNESIUM: MAGNESIUM: 1.7 mg/dL (ref 1.6–2.6)

## 2022-03-21 LAB — CANCER ANTIGEN 19-9: CA 19-9: 21.26 U/mL (ref 0–35)

## 2022-03-21 MED ADMIN — fluorouracil (ADRUCIL) 4,000 mg in sodium chloride (NS) 0.9 % 46-hr infusion CADD: 2400 mg/m2 | INTRAVENOUS | @ 18:00:00

## 2022-03-21 MED ADMIN — dextrose 5 % infusion: 100 mL/h | INTRAVENOUS | @ 14:00:00

## 2022-03-21 MED ADMIN — ondansetron (ZOFRAN) tablet 24 mg: 24 mg | ORAL | @ 13:00:00 | Stop: 2022-03-21

## 2022-03-21 MED ADMIN — dexAMETHasone (DECADRON) tablet 20 mg: 20 mg | ORAL | @ 13:00:00 | Stop: 2022-03-21

## 2022-03-21 MED ADMIN — leucovorin 664 mg in dextrose 5 % 50 mL IVPB: 400 mg/m2 | INTRAVENOUS | @ 16:00:00 | Stop: 2022-03-21

## 2022-03-21 MED ADMIN — prochlorperazine (COMPAZINE) injection 10 mg: 10 mg | INTRAVENOUS | @ 17:00:00

## 2022-03-21 MED ADMIN — irinotecan (CAMPTOSAR) 249 mg in dextrose 5 % 500 mL IVPB: 150 mg/m2 | INTRAVENOUS | @ 16:00:00 | Stop: 2022-03-21

## 2022-03-21 MED ADMIN — oxaliplatin (ELOXATIN) 141.1 mg in dextrose 5 % 250 mL IVPB: 85 mg/m2 | INTRAVENOUS | @ 14:00:00 | Stop: 2022-03-21

## 2022-03-21 NOTE — Unmapped (Signed)
Patient arrived to chair 14 to receive folfirinox. Labs within parameter for tx today. Port was flushed, + BR. Premeds given. Pt. completed treatment, but did have n/v and was given compazine PRN. APP messaged when second episode of emesis occurred, but pt declined ativan that was ordered. Port was flushed, +BR. Port was left accessed and patient discharged with no additional needs, AVS provided.

## 2022-03-21 NOTE — Unmapped (Signed)
Hospital Outpatient Visit on 03/21/2022   Component Date Value Ref Range Status    Sodium 03/21/2022 142  135 - 145 mmol/L Final    Potassium 03/21/2022 3.8  3.5 - 5.1 mmol/L Final    Chloride 03/21/2022 108 (H)  98 - 107 mmol/L Final    CO2 03/21/2022 28.0  20.0 - 31.0 mmol/L Final    Anion Gap 03/21/2022 6  5 - 14 mmol/L Final    BUN 03/21/2022 12  9 - 23 mg/dL Final    Creatinine 16/05/9603 0.68  0.60 - 0.80 mg/dL Final    BUN/Creatinine Ratio 03/21/2022 18   Final    eGFR CKD-EPI (2021) Female 03/21/2022 >90  >=60 mL/min/1.15m2 Final    eGFR calculated with CKD-EPI 2021 equation in accordance with [employer] and [employer] of Nephrology Task Force recommendations.    Glucose 03/21/2022 138  70 - 179 mg/dL Final    Calcium 54/04/8118 8.7  8.7 - 10.4 mg/dL Final    Albumin 14/78/2956 2.9 (L)  3.4 - 5.0 g/dL Final    Total Protein 03/21/2022 6.0  5.7 - 8.2 g/dL Final    Total Bilirubin 03/21/2022 0.4  0.3 - 1.2 mg/dL Final    AST 21/30/8657 29  <=34 U/L Final    ALT 03/21/2022 42  10 - 49 U/L Final    Alkaline Phosphatase 03/21/2022 173 (H)  46 - 116 U/L Final    Magnesium 03/21/2022 1.7  1.6 - 2.6 mg/dL Final    CA 84-6 96/29/5284 21.26  0 - 35 U/mL Final    WBC 03/21/2022 7.2  3.6 - 11.2 10*9/L Final    RBC 03/21/2022 3.24 (L)  3.95 - 5.13 10*12/L Final    HGB 03/21/2022 9.4 (L)  11.3 - 14.9 g/dL Final    HCT 13/24/4010 28.5 (L)  34.0 - 44.0 % Final    MCV 03/21/2022 88.1  77.6 - 95.7 fL Final    MCH 03/21/2022 29.1  25.9 - 32.4 pg Final    MCHC 03/21/2022 33.0  32.0 - 36.0 g/dL Final    RDW 27/25/3664 21.6 (H)  12.2 - 15.2 % Final    MPV 03/21/2022 8.1  6.8 - 10.7 fL Final    Platelet 03/21/2022 122 (L)  150 - 450 10*9/L Final    Neutrophils % 03/21/2022 71.2  % Final    Lymphocytes % 03/21/2022 18.6  % Final    Monocytes % 03/21/2022 9.3  % Final    Eosinophils % 03/21/2022 0.6  % Final    Basophils % 03/21/2022 0.3  % Final    Absolute Neutrophils 03/21/2022 5.1  1.8 - 7.8 10*9/L Final    Absolute Lymphocytes 03/21/2022 1.3  1.1 - 3.6 10*9/L Final    Absolute Monocytes 03/21/2022 0.7  0.3 - 0.8 10*9/L Final    Absolute Eosinophils 03/21/2022 0.0  0.0 - 0.5 10*9/L Final    Absolute Basophils 03/21/2022 0.0  0.0 - 0.1 10*9/L Final    Anisocytosis 03/21/2022 Moderate (A)  Not Present Final

## 2022-03-23 ENCOUNTER — Ambulatory Visit: Admit: 2022-03-23 | Discharge: 2022-03-24 | Payer: PRIVATE HEALTH INSURANCE

## 2022-03-23 MED ADMIN — heparin, porcine (PF) 100 unit/mL injection 500 Units: 500 [IU] | INTRAVENOUS | @ 16:00:00 | Stop: 2022-03-24

## 2022-03-23 NOTE — Unmapped (Signed)
RED ZONE Means: RED ZONE: Take action now!     You need to be seen right away  Symptoms are at a severe level of discomfort    Call 911 or go to your nearest  Hospital for help     - Bleeding that will not stop    - Hard to breathe    - New seizure - Chest pain  - Fall or passing out  -Thoughts of hurting    yourself or others      Call 911 if you are going into the RED ZONE                  YELLOW ZONE Means:     Please call with any new or worsening symptom(s), even if not on this list.  Call [phone]  After hours, weekends, and holidays - you will reach a long recording with specific instructions, If not in an emergency such as above, please listen closely all the way to the end and choose the option that relates to your need.   You can be seen by a provider the same day through our Same Day Acute Care for Patients with Cancer program.      YELLOW ZONE: Take action today     Symptoms are new or worsening  You are not within your goal range for:    - Pain    - Shortness of breath    - Bleeding (nose, urine, stool, wound)    - Feeling sick to your stomach and throwing up    - Mouth sores/pain in your mouth or throat    - Hard stool or very loose stools (increase in       ostomy output)    - No urine for 12 hours    - Feeding tube or other catheter/tube issue    - Redness or pain at previous IV or port/catheter site    - Depressed or anxiety   - Swelling (leg, arm, abdomen,     face, neck)  - Skin rash or skin changes  - Wound issues (redness, drainage,    re-opened)  - Confusion  - Vision changes  - Fever >100.4 F or chills  - Worsening cough with mucus that is    green, yellow, or bloody  - Pain or burning when going to the    bathroom  - Home Infusion Pump Issue- call    [phone]         Call your healthcare provider if you are going into the YELLOW ZONE     GREEN ZONE Means:  Your symptoms are under controls  Continue to take your medicine as ordered  Keep all visits to the provider GREEN ZONE: You are in control  No increase or worsening symptoms  Able to take your medicine  Able to drink and eat    - DO NOT use MyChart messages to report red or yellow symptoms. Allow up to 3    business days for a reply.  -MyChart is for non-urgent medication refills, scheduling requests, or other general questions.         [id number] Rev. 02/21/2022  Approved by Oncology Patient Education Committee

## 2022-03-23 NOTE — Unmapped (Signed)
Pt here for Ambulatory chemo pump disconnected.  Right PAC flushed per protocol with good blood return.  HN removed intact.  Pt tolerated procedure without difficulty.  Pt was stable at discharge via wheelchair by husband.

## 2022-03-29 DIAGNOSIS — C259 Malignant neoplasm of pancreas, unspecified: Principal | ICD-10-CM

## 2022-04-04 NOTE — Unmapped (Addendum)
 Health Central Navigation: Follow-Up    Completed with: Hardin Negus (significant other)     Oncology Patient Navigator (OPN) provided follow-up call to review previously discussed resources/identified barriers and evaluate current needs.     Spoke to Mr. [name] today, patient was not at home. Per Mr. [name] patient is eating well, drinking enough fluids to stay hydrated. No unexpected related to treatment side effects.      On previous call, OPN communicated with [email or web address] their staff confirmed application and income verification were on file, however 1st page from application  and pet assistance form were missing.  Page 1 from application completed and submitted by OPN. Confirmation pending ([facility] closed on Fridays).    Mr. [name] endorsed understanding of upcoming appointment times/location as well as how to contact medical team if needed.     Additional follow-up call will occur in the coming weeks.

## 2022-04-08 DIAGNOSIS — C259 Malignant neoplasm of pancreas, unspecified: Principal | ICD-10-CM

## 2022-04-08 MED ORDER — PROCHLORPERAZINE MALEATE 10 MG TABLET
ORAL_TABLET | Freq: Four times a day (QID) | ORAL | 2 refills | 8 days | Status: CP | PRN
Start: 2022-04-08 — End: ?

## 2022-04-08 MED ORDER — ONDANSETRON HCL 8 MG TABLET
ORAL_TABLET | Freq: Three times a day (TID) | ORAL | 2 refills | 10 days | Status: CP | PRN
Start: 2022-04-08 — End: ?

## 2022-04-08 NOTE — Unmapped (Signed)
Hi,    Patient Norma Green called requesting a medication refill for the following:    Medication: zofran  Dosage: 8mg   Days left of medication: 0  Pharmacy: [facility]    Medication: compazine  Dosage: 10mg   Days left: 0  Pharmacy: [facility]      The expected turnaround time is 3-4 business days       Check Indicates criteria has been reviewed and confirmed with the patient:    [x]  Preferred Name   [x]  DOB and/or MR#  [x]  Preferred Contact Method  [x]  Phone Number(s)   [x]  Preferred Pharmacy   []  MyChart     Thank you,  [name]  [facility]  [phone]

## 2022-04-09 NOTE — Unmapped (Signed)
[facility] Refill Coordination Note    Specialty Medication(s) to be Shipped:   Hematology/Oncology: Norma Green    Other medication(s) to be shipped: No additional medications requested for fill at this time     Norma Green, DOB: Feb 12, 1961  Phone: [phone] (home)       All above HIPAA information was verified with patient.     Was a Nurse, learning disability used for this call? No    Completed refill call assessment today to schedule patient's medication shipment from the [facility] [phone]).  All relevant notes have been reviewed.     Specialty medication(s) and dose(s) confirmed: Regimen is correct and unchanged.   Changes to medications: Naidelyn reports no changes at this time.  Changes to insurance: No  New side effects reported not previously addressed with a pharmacist or physician: None reported  Questions for the pharmacist: No    Confirmed patient received a Conservation officer, historic buildings and a Surveyor, mining with first shipment. The patient will receive a drug information handout for each medication shipped and additional FDA Medication Guides as required.       DISEASE/MEDICATION-SPECIFIC INFORMATION        For patients on injectable medications: Patient currently has 1 doses left.  Next injection is scheduled for 04/14/22.    SPECIALTY MEDICATION ADHERENCE     Medication Adherence    Patient reported X missed doses in the last month: 0  Specialty Medication: Udenyca 6mg /0.31mL  Patient is on additional specialty medications: No  Informant: patient                       Were doses missed due to medication being on hold? No    Udenyca 6mg /0.66mL: 1 days of medicine on hand       REFERRAL TO PHARMACIST     Referral to the pharmacist: Not needed      [facility]     Shipping address confirmed in Epic.     Delivery Scheduled: Yes, Expected medication delivery date: 04/24/22.     Medication will be delivered via Same Day Courier to the prescription address in Epic [place].    Norma Green   [facility] Specialty Technician

## 2022-04-11 ENCOUNTER — Ambulatory Visit: Admit: 2022-04-11 | Discharge: 2022-04-11 | Payer: PRIVATE HEALTH INSURANCE

## 2022-04-11 LAB — CBC W/ AUTO DIFF
BASOPHILS ABSOLUTE COUNT: 0 10*9/L (ref 0.0–0.1)
BASOPHILS RELATIVE PERCENT: 0.8 %
EOSINOPHILS ABSOLUTE COUNT: 0.1 10*9/L (ref 0.0–0.5)
EOSINOPHILS RELATIVE PERCENT: 1 %
HEMATOCRIT: 29.9 % — ABNORMAL LOW (ref 34.0–44.0)
HEMOGLOBIN: 9.9 g/dL — ABNORMAL LOW (ref 11.3–14.9)
LYMPHOCYTES ABSOLUTE COUNT: 1.9 10*9/L (ref 1.1–3.6)
LYMPHOCYTES RELATIVE PERCENT: 37.9 %
MEAN CORPUSCULAR HEMOGLOBIN CONC: 33.2 g/dL (ref 32.0–36.0)
MEAN CORPUSCULAR HEMOGLOBIN: 30.6 pg (ref 25.9–32.4)
MEAN CORPUSCULAR VOLUME: 92.1 fL (ref 77.6–95.7)
MEAN PLATELET VOLUME: 7.3 fL (ref 6.8–10.7)
MONOCYTES ABSOLUTE COUNT: 0.7 10*9/L (ref 0.3–0.8)
MONOCYTES RELATIVE PERCENT: 13.8 %
NEUTROPHILS ABSOLUTE COUNT: 2.4 10*9/L (ref 1.8–7.8)
NEUTROPHILS RELATIVE PERCENT: 46.5 %
PLATELET COUNT: 162 10*9/L (ref 150–450)
RED BLOOD CELL COUNT: 3.25 10*12/L — ABNORMAL LOW (ref 3.95–5.13)
RED CELL DISTRIBUTION WIDTH: 19.4 % — ABNORMAL HIGH (ref 12.2–15.2)
WBC ADJUSTED: 5.1 10*9/L (ref 3.6–11.2)

## 2022-04-11 LAB — COMPREHENSIVE METABOLIC PANEL
ALBUMIN: 3.3 g/dL — ABNORMAL LOW (ref 3.4–5.0)
ALKALINE PHOSPHATASE: 132 U/L — ABNORMAL HIGH (ref 46–116)
ALT (SGPT): 34 U/L (ref 10–49)
ANION GAP: 5 mmol/L (ref 5–14)
AST (SGOT): 30 U/L (ref <=34)
BILIRUBIN TOTAL: 0.4 mg/dL (ref 0.3–1.2)
BLOOD UREA NITROGEN: 14 mg/dL (ref 9–23)
BUN / CREAT RATIO: 21
CALCIUM: 8.8 mg/dL (ref 8.7–10.4)
CHLORIDE: 107 mmol/L (ref 98–107)
CO2: 30 mmol/L (ref 20.0–31.0)
CREATININE: 0.67 mg/dL
EGFR CKD-EPI (2021) FEMALE: >90 mL/min/{1.73_m2} (ref >=60)
GLUCOSE RANDOM: 106 mg/dL (ref 70–179)
POTASSIUM: 3.6 mmol/L (ref 3.4–4.8)
PROTEIN TOTAL: 7 g/dL (ref 5.7–8.2)
SODIUM: 142 mmol/L (ref 135–145)

## 2022-04-11 LAB — MAGNESIUM: MAGNESIUM: 1.7 mg/dL (ref 1.6–2.6)

## 2022-04-11 MED ADMIN — ondansetron (ZOFRAN) tablet 24 mg: 24 mg | ORAL | @ 14:00:00 | Stop: 2022-04-11

## 2022-04-11 MED ADMIN — dexAMETHasone (DECADRON) tablet 20 mg: 20 mg | ORAL | @ 14:00:00 | Stop: 2022-04-11

## 2022-04-11 MED ADMIN — leuCOVorin 664 mg in dextrose 5 % 50 mL IVPB: 400 mg/m2 | INTRAVENOUS | @ 17:00:00 | Stop: 2022-04-11

## 2022-04-11 MED ADMIN — prochlorperazine (COMPAZINE) tablet 10 mg: 10 mg | ORAL | @ 14:00:00

## 2022-04-11 MED ADMIN — atropine injection: .4 mg | INTRAVENOUS | @ 17:00:00

## 2022-04-11 MED ADMIN — fluorouracil (ADRUCIL) 4,000 mg in sodium chloride (NS) 0.9 % 46-hr infusion CADD: 2400 mg/m2 | INTRAVENOUS | @ 19:00:00

## 2022-04-11 MED ADMIN — irinotecan (CAMPTOSAR) 249 mg in dextrose 5 % 500 mL IVPB: 150 mg/m2 | INTRAVENOUS | @ 17:00:00 | Stop: 2022-04-11

## 2022-04-11 MED ADMIN — dextrose 5 % infusion: 100 mL/h | INTRAVENOUS | @ 15:00:00

## 2022-04-11 MED ADMIN — oxaliplatin (ELOXATIN) 141.1 mg in dextrose 5 % 250 mL IVPB: 85 mg/m2 | INTRAVENOUS | @ 15:00:00 | Stop: 2022-04-11

## 2022-04-11 NOTE — Unmapped (Signed)
RED ZONE Means: RED ZONE: Take action now!     You need to be seen right away  Symptoms are at a severe level of discomfort    Call 911 or go to your nearest  Hospital for help     - Bleeding that will not stop    - Hard to breathe    - New seizure - Chest pain  - Fall or passing out  -Thoughts of hurting    yourself or others      Call 911 if you are going into the RED ZONE                  YELLOW ZONE Means:     Please call with any new or worsening symptom(s), even if not on this list.  Call [phone]  After hours, weekends, and holidays - you will reach a long recording with specific instructions, If not in an emergency such as above, please listen closely all the way to the end and choose the option that relates to your need.   You can be seen by a provider the same day through our Same Day Acute Care for Patients with Cancer program.      YELLOW ZONE: Take action today     Symptoms are new or worsening  You are not within your goal range for:    - Pain    - Shortness of breath    - Bleeding (nose, urine, stool, wound)    - Feeling sick to your stomach and throwing up    - Mouth sores/pain in your mouth or throat    - Hard stool or very loose stools (increase in       ostomy output)    - No urine for 12 hours    - Feeding tube or other catheter/tube issue    - Redness or pain at previous IV or port/catheter site    - Depressed or anxiety   - Swelling (leg, arm, abdomen,     face, neck)  - Skin rash or skin changes  - Wound issues (redness, drainage,    re-opened)  - Confusion  - Vision changes  - Fever >100.4 F or chills  - Worsening cough with mucus that is    green, yellow, or bloody  - Pain or burning when going to the    bathroom  - Home Infusion Pump Issue- call    [phone]         Call your healthcare provider if you are going into the YELLOW ZONE     GREEN ZONE Means:  Your symptoms are under controls  Continue to take your medicine as ordered  Keep all visits to the provider GREEN ZONE: You are in control  No increase or worsening symptoms  Able to take your medicine  Able to drink and eat    - DO NOT use MyChart messages to report red or yellow symptoms. Allow up to 3    business days for a reply.  -MyChart is for non-urgent medication refills, scheduling requests, or other general questions.         [id number] Rev. 02/21/2022  Approved by Oncology Patient Education Committee          Hospital Outpatient Visit on 04/11/2022   Component Date Value Ref Range Status    Sodium 04/11/2022 142  135 - 145 mmol/L Final    Potassium 04/11/2022 3.6  3.4 - 4.8 mmol/L Final    Chloride 04/11/2022  107  98 - 107 mmol/L Final    CO2 04/11/2022 30.0  20.0 - 31.0 mmol/L Final    Anion Gap 04/11/2022 5  5 - 14 mmol/L Final    BUN 04/11/2022 14  9 - 23 mg/dL Final    Creatinine 16/05/9603 0.67  0.60 - 0.80 mg/dL Final    BUN/Creatinine Ratio 04/11/2022 21   Final    eGFR CKD-EPI (2021) Female 04/11/2022 >90  >=60 mL/min/1.1m2 Final    eGFR calculated with CKD-EPI 2021 equation in accordance with [employer] and [employer] of Nephrology Task Force recommendations.    Glucose 04/11/2022 106  70 - 179 mg/dL Final    Calcium 54/04/8118 8.8  8.7 - 10.4 mg/dL Final    Albumin 14/78/2956 3.3 (L)  3.4 - 5.0 g/dL Final    Total Protein 04/11/2022 7.0  5.7 - 8.2 g/dL Final    Total Bilirubin 04/11/2022 0.4  0.3 - 1.2 mg/dL Final    AST 21/30/8657 30  <=34 U/L Final    ALT 04/11/2022 34  10 - 49 U/L Final    Alkaline Phosphatase 04/11/2022 132 (H)  46 - 116 U/L Final    Magnesium 04/11/2022 1.7  1.6 - 2.6 mg/dL Final    WBC 84/69/6295 5.1  3.6 - 11.2 10*9/L Final    RBC 04/11/2022 3.25 (L)  3.95 - 5.13 10*12/L Final    HGB 04/11/2022 9.9 (L)  11.3 - 14.9 g/dL Final    HCT 28/41/3244 29.9 (L)  34.0 - 44.0 % Final    MCV 04/11/2022 92.1  77.6 - 95.7 fL Final    MCH 04/11/2022 30.6  25.9 - 32.4 pg Final    MCHC 04/11/2022 33.2  32.0 - 36.0 g/dL Final    RDW 08/27/7251 19.4 (H)  12.2 - 15.2 % Final    MPV 04/11/2022 7.3  6.8 - 10.7 fL Final    Platelet 04/11/2022 162  150 - 450 10*9/L Final    Neutrophils % 04/11/2022 46.5  % Final    Lymphocytes % 04/11/2022 37.9  % Final    Monocytes % 04/11/2022 13.8  % Final    Eosinophils % 04/11/2022 1.0  % Final    Basophils % 04/11/2022 0.8  % Final    Absolute Neutrophils 04/11/2022 2.4  1.8 - 7.8 10*9/L Final    Absolute Lymphocytes 04/11/2022 1.9  1.1 - 3.6 10*9/L Final    Absolute Monocytes 04/11/2022 0.7  0.3 - 0.8 10*9/L Final    Absolute Eosinophils 04/11/2022 0.1  0.0 - 0.5 10*9/L Final    Absolute Basophils 04/11/2022 0.0  0.0 - 0.1 10*9/L Final    Anisocytosis 04/11/2022 Moderate (A)  Not Present Final

## 2022-04-11 NOTE — Unmapped (Signed)
Reason for Disposition   General information question, no triage required and triager able to answer question    Answer Assessment - Initial Assessment Questions  1. REASON FOR CALL or QUESTION: What is your reason for calling today? or How can I best help you? or What question do you have that I can help answer?      Wanted to verify appointment time for chemo tomorrow    Protocols used: Information Only Call - No Triage-A-AH

## 2022-04-11 NOTE — Unmapped (Signed)
09:10 - pt ambulated to treatment area with stable gait for planned treatment with FOLFIRINOX. Pt states feeling well and offers no complaints at this time - pt denies neuropathy symptoms at this time. Pt RCW Single Port accessed via sterile technique with good BR and CHG gel drsg placed and labs drawn and sent, awaiting results.     10:00 - Pt labs reviewed, within parameters to treat. Request for drug sent to pharmacy. Pt aware and in agreement with plan. Pt pre-medicated per order     15:15 - pt tolerated treatment well and Port with good BR at completion.  New batteries placed in CADD pump and patient home with Fluorouracil CADD pump infusing to Port, clamps and connections checked. No concerns upon discharge and pt given copy of AVS. Pt aware to return to clinic Sunday to have Pump removed and port de-accessed. Pt ambulated from treatment area with stable gait with family member.

## 2022-04-13 ENCOUNTER — Ambulatory Visit: Admit: 2022-04-13 | Discharge: 2022-04-14 | Payer: PRIVATE HEALTH INSURANCE

## 2022-04-13 MED ADMIN — heparin, porcine (PF) 100 unit/mL injection 500 Units: 500 [IU] | INTRAVENOUS | @ 17:00:00 | Stop: 2022-04-14

## 2022-04-13 NOTE — Unmapped (Signed)
Pt in chair 2. Here for pump disconnect. Pump disconnected and returned to pt. Port flushed, + blood return. Port de accessed and flushed with heparin. Declined AVS. Husband escorted pt out via w/c.

## 2022-04-13 NOTE — Unmapped (Signed)
No visits with results within 1 Day(s) from this visit.   Latest known visit with results is:   Hospital Outpatient Visit on 04/11/2022   Component Date Value Ref Range Status    Sodium 04/11/2022 142  135 - 145 mmol/L Final    Potassium 04/11/2022 3.6  3.4 - 4.8 mmol/L Final    Chloride 04/11/2022 107  98 - 107 mmol/L Final    CO2 04/11/2022 30.0  20.0 - 31.0 mmol/L Final    Anion Gap 04/11/2022 5  5 - 14 mmol/L Final    BUN 04/11/2022 14  9 - 23 mg/dL Final    Creatinine 16/05/9603 0.67  0.60 - 0.80 mg/dL Final    BUN/Creatinine Ratio 04/11/2022 21   Final    eGFR CKD-EPI (2021) Female 04/11/2022 >90  >=60 mL/min/1.83m2 Final    eGFR calculated with CKD-EPI 2021 equation in accordance with [employer] and [employer]ph[employer] Task Force recommendations.    Glucose 04/11/2022 106  70 - 179 mg/dL Final    Calcium 54/04/8118 8.8  8.7 - 10.4 mg/dL Final    Albumin 14/78/2956 3.3 (L)  3.4 - 5.0 g/dL Final    Total Protein 04/11/2022 7.0  5.7 - 8.2 g/dL Final    Total Bilirubin 04/11/2022 0.4  0.3 - 1.2 mg/dL Final    AST 21/30/8657 30  <=34 U/L Final    ALT 04/11/2022 34  10 - 49 U/L Final    Alkaline Phosphatase 04/11/2022 132 (H)  46 - 116 U/L Final    Magnesium 04/11/2022 1.7  1.6 - 2.6 mg/dL Final    WBC 84/69/6295 5.1  3.6 - 11.2 10*9/L Final    RBC 04/11/2022 3.25 (L)  3.95 - 5.13 10*12/L Final    HGB 04/11/2022 9.9 (L)  11.3 - 14.9 g/dL Final    HCT 28/41/3244 29.9 (L)  34.0 - 44.0 % Final    MCV 04/11/2022 92.1  77.6 - 95.7 fL Final    MCH 04/11/2022 30.6  25.9 - 32.4 pg Final    MCHC 04/11/2022 33.2  32.0 - 36.0 g/dL Final    RDW 08/27/7251 19.4 (H)  12.2 - 15.2 % Final    MPV 04/11/2022 7.3  6.8 - 10.7 fL Final    Platelet 04/11/2022 162  150 - 450 10*9/L Final    Neutrophils % 04/11/2022 46.5  % Final    Lymphocytes % 04/11/2022 37.9  % Final    Monocytes % 04/11/2022 13.8  % Final    Eosinophils % 04/11/2022 1.0  % Final    Basophils % 04/11/2022 0.8  % Final    Absolute Neutrophils 04/11/2022 2.4  1.8 - 7.8 10*9/L Final    Absolute Lymphocytes 04/11/2022 1.9  1.1 - 3.6 10*9/L Final    Absolute Monocytes 04/11/2022 0.7  0.3 - 0.8 10*9/L Final    Absolute Eosinophils 04/11/2022 0.1  0.0 - 0.5 10*9/L Final    Absolute Basophils 04/11/2022 0.0  0.0 - 0.1 10*9/L Final    Anisocytosis 04/11/2022 Moderate (A)  Not Present Final

## 2022-04-16 ENCOUNTER — Ambulatory Visit: Admit: 2022-04-16 | Discharge: 2022-04-16 | Payer: PRIVATE HEALTH INSURANCE

## 2022-04-18 ENCOUNTER — Ambulatory Visit: Admit: 2022-04-18 | Discharge: 2022-04-19 | Payer: PRIVATE HEALTH INSURANCE

## 2022-04-18 ENCOUNTER — Ambulatory Visit
Admit: 2022-04-18 | Discharge: 2022-04-19 | Payer: PRIVATE HEALTH INSURANCE | Attending: Hematology & Oncology | Primary: Hematology & Oncology

## 2022-04-18 MED ADMIN — heparin, porcine (PF) 100 unit/mL injection 500 Units: 500 [IU] | INTRAVENOUS | @ 14:00:00 | Stop: 2022-04-19

## 2022-04-18 MED ADMIN — iohexoL (OMNIPAQUE) 350 mg iodine/mL solution 100 mL: 100 mL | INTRAVENOUS | @ 14:00:00 | Stop: 2022-04-18

## 2022-04-18 NOTE — Unmapped (Unsigned)
[facility]     PRIMARY CARE PROVIDER:  Ollen Barges, MD  [address]  [place] [address]    CONSULTING PROVIDERS  [facility] Biliary Team  ____________________________________________________________________    CANCER HISTORY  Diagnosis: Pancreatic Adenocarcinoma, imaging concerning for metastatic disease to liver  1. 12/12/21: Presented with abdominal pain, weight loss found to have CBD obstruction with both pancreatic head and tail mass along with liver lesion. EUS/ERCP with CBD stent placement.  Biopsy of pancreatic tail mass with adenocarcinoma. Concern for metastatic disease with 1 cm liver lesion.  Also, local extension to left adrenal.   2. 5/4 start FOLFIRINOX (Cy 1 FOLFOX)    Molecular:  Somatic: KRAS, TP53, CCND3, TFEB, VEGFA, TMP 6.3 m/MB  Germline: negative   ____________________________________________________________________    ASSESSMENT  1. 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 Cancer associated abdominal pain - improving  3. Opioid Induced Constipation   4. Chemo neuropathy, grade 1, ctm  5. Chemotherapy induced neutropenia - managed with GCSF    RECOMMENDATIONS  1. mFOLIRINOX + GCSF support   2. Oxycodone 5mg  every 4 hours prn   3. Continue Miralax and Senna - increase senna to bid prn  4. Scheduled dex x 3 days post chemo, zofran, compazine prn  5. RTC 2 weeks for treatment, 4 weeks for scans, labs, visit and treatment. [Pump disconnect at [place]]    DISCUSSION   Doing really well. Reviewed excellent progress to date and good chemo tolerance. Anticipate worsening neuropathy over the course of next few cycles and that we may have to decrease/drop oxali, she will monitor and we'll drop pending change    Very mild URI symptoms whic hare improving--low risk for severe viral complication particularly with GCSF support. Ok for treatment tomorrow am    Decision-making today was high complexity on the basis of discussion of cancer, and weighing use of high risk chemotherapy, toxicity and complications/comorbidity of metastatic pancreatic cancer.  ______________________________________________________________________  HISTORY     Norma Green is seen today at the [facility] for ongoing management regarding pancreatic cancer.     Doing well-- grandbaby got URI from daycare and the past few days she has had a mild sore throat. No fever, cough or rhinnorhea. No myalgais. Able to eat and drink fine, not backing down on her energy/activity. She thinks throat is starting to get a bit better already.     + tingling in her fingers that is pretty persistent. Not painful, not interfering with function, none in toes.     Pain is better, not taking nearly as much pain meds    Eating really well and gained weight      MEDICAL HISTORY  1. HTN  2. Anxiety    Allergies   Allergen Reactions    Metronidazole Nausea And Vomiting     Medications reviewed in the EMR    SOCIAL HISTORY  Lives with family including husband. Smoker. No alcohol or drug use.  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 New grandbaby on the way 03/2022    PHYSICAL EXAM  There were no vitals taken for this visit.   Video-- looks awesome. So upbeat    OBJECTIVE DATA  LABS  Labs reviewed in emr     RADIOLOGY RESULTS

## 2022-04-24 MED FILL — UDENYCA 6 MG/0.6 ML SUBCUTANEOUS SYRINGE: SUBCUTANEOUS | 28 days supply | Qty: 1.2 | Fill #1

## 2022-04-25 ENCOUNTER — Ambulatory Visit: Admit: 2022-04-25 | Discharge: 2022-04-26 | Payer: PRIVATE HEALTH INSURANCE

## 2022-04-25 DIAGNOSIS — D696 Thrombocytopenia, unspecified: Principal | ICD-10-CM

## 2022-04-25 DIAGNOSIS — G620 Drug-induced polyneuropathy: Principal | ICD-10-CM

## 2022-04-25 DIAGNOSIS — T451X5A Adverse effect of antineoplastic and immunosuppressive drugs, initial encounter: Principal | ICD-10-CM

## 2022-04-25 DIAGNOSIS — C259 Malignant neoplasm of pancreas, unspecified: Principal | ICD-10-CM

## 2022-04-25 DIAGNOSIS — C787 Secondary malignant neoplasm of liver and intrahepatic bile duct: Principal | ICD-10-CM

## 2022-04-25 DIAGNOSIS — Z09 Encounter for follow-up examination after completed treatment for conditions other than malignant neoplasm: Principal | ICD-10-CM

## 2022-04-25 DIAGNOSIS — D701 Agranulocytosis secondary to cancer chemotherapy: Principal | ICD-10-CM

## 2022-04-25 LAB — MAGNESIUM: MAGNESIUM: 1.6 mg/dL (ref 1.6–2.6)

## 2022-04-25 LAB — COMPREHENSIVE METABOLIC PANEL
ALBUMIN: 3.2 g/dL — ABNORMAL LOW (ref 3.4–5.0)
ALKALINE PHOSPHATASE: 148 U/L — ABNORMAL HIGH (ref 46–116)
ALT (SGPT): 35 U/L (ref 10–49)
ANION GAP: 7 mmol/L (ref 5–14)
AST (SGOT): 28 U/L (ref <=34)
BILIRUBIN TOTAL: 0.3 mg/dL (ref 0.3–1.2)
BLOOD UREA NITROGEN: 13 mg/dL (ref 9–23)
BUN / CREAT RATIO: 20
CALCIUM: 8.9 mg/dL (ref 8.7–10.4)
CHLORIDE: 109 mmol/L — ABNORMAL HIGH (ref 98–107)
CO2: 29 mmol/L (ref 20.0–31.0)
CREATININE: 0.65 mg/dL
EGFR CKD-EPI (2021) FEMALE: >90 mL/min/{1.73_m2} (ref >=60)
GLUCOSE RANDOM: 161 mg/dL (ref 70–179)
POTASSIUM: 3.6 mmol/L (ref 3.5–5.1)
PROTEIN TOTAL: 6.3 g/dL (ref 5.7–8.2)
SODIUM: 145 mmol/L (ref 135–145)

## 2022-04-25 LAB — CBC W/ AUTO DIFF
BASOPHILS ABSOLUTE COUNT: 0 10*9/L (ref 0.0–0.1)
BASOPHILS RELATIVE PERCENT: 0.3 %
EOSINOPHILS ABSOLUTE COUNT: 0 10*9/L (ref 0.0–0.5)
EOSINOPHILS RELATIVE PERCENT: 0.6 %
HEMATOCRIT: 29.4 % — ABNORMAL LOW (ref 34.0–44.0)
HEMOGLOBIN: 9.8 g/dL — ABNORMAL LOW (ref 11.3–14.9)
LYMPHOCYTES ABSOLUTE COUNT: 1.5 10*9/L (ref 1.1–3.6)
LYMPHOCYTES RELATIVE PERCENT: 20.6 %
MEAN CORPUSCULAR HEMOGLOBIN CONC: 33.3 g/dL (ref 32.0–36.0)
MEAN CORPUSCULAR HEMOGLOBIN: 31.1 pg (ref 25.9–32.4)
MEAN CORPUSCULAR VOLUME: 93.4 fL (ref 77.6–95.7)
MEAN PLATELET VOLUME: 8.3 fL (ref 6.8–10.7)
MONOCYTES ABSOLUTE COUNT: 0.5 10*9/L (ref 0.3–0.8)
MONOCYTES RELATIVE PERCENT: 7.4 %
NEUTROPHILS ABSOLUTE COUNT: 5.1 10*9/L (ref 1.8–7.8)
NEUTROPHILS RELATIVE PERCENT: 71.1 %
PLATELET COUNT: 84 10*9/L — ABNORMAL LOW (ref 150–450)
RED BLOOD CELL COUNT: 3.15 10*12/L — ABNORMAL LOW (ref 3.95–5.13)
RED CELL DISTRIBUTION WIDTH: 17.1 % — ABNORMAL HIGH (ref 12.2–15.2)
WBC ADJUSTED: 7.2 10*9/L (ref 3.6–11.2)

## 2022-04-25 LAB — SLIDE REVIEW

## 2022-04-25 LAB — CANCER ANTIGEN 19-9: CA 19-9: 14.45 U/mL (ref 0–35)

## 2022-04-25 MED ADMIN — ondansetron (ZOFRAN) tablet 24 mg: 24 mg | ORAL | @ 13:00:00 | Stop: 2022-04-25

## 2022-04-25 MED ADMIN — atropine injection: .4 mg | INTRAVENOUS | @ 14:00:00 | Stop: 2022-04-25

## 2022-04-25 MED ADMIN — dextrose 5 % infusion: 100 mL/h | INTRAVENOUS | @ 14:00:00

## 2022-04-25 MED ADMIN — dexAMETHasone (DECADRON) tablet 20 mg: 20 mg | ORAL | @ 13:00:00 | Stop: 2022-04-25

## 2022-04-25 MED ADMIN — leuCOVorin 664 mg in dextrose 5 % 50 mL IVPB: 400 mg/m2 | INTRAVENOUS | @ 14:00:00 | Stop: 2022-04-25

## 2022-04-25 MED ADMIN — irinotecan (CAMPTOSAR) 249 mg in dextrose 5 % 500 mL IVPB: 150 mg/m2 | INTRAVENOUS | @ 14:00:00 | Stop: 2022-04-25

## 2022-04-25 MED ADMIN — fluorouracil (ADRUCIL) 4,000 mg in sodium chloride (NS) 0.9 % 46-hr infusion CADD: 2400 mg/m2 | INTRAVENOUS | @ 16:00:00

## 2022-04-25 MED ADMIN — atropine injection: .4 mg | INTRAVENOUS | @ 16:00:00 | Stop: 2022-04-25

## 2022-04-25 NOTE — Unmapped (Signed)
[facility]     PRIMARY CARE PROVIDER:  Ollen Barges, MD  [address]  [place] [address]    CONSULTING PROVIDERS  [facility] Biliary Team  ____________________________________________________________________    CANCER HISTORY  Diagnosis: Pancreatic Adenocarcinoma, imaging concerning for metastatic disease to liver  1. 12/12/21: Presented with abdominal pain, weight loss found to have CBD obstruction with both pancreatic head and tail mass along with liver lesion. EUS/ERCP with CBD stent placement.  Biopsy of pancreatic tail mass with adenocarcinoma. Concern for metastatic disease with 1 cm liver lesion.  Also, local extension to left adrenal.   2. 5/4 start FOLFIRINOX (Cy 1 FOLFOX)    Molecular:  Somatic: KRAS, TP53, CCND3, TFEB, VEGFA, TMP 6.3 m/MB  Germline: negative   ____________________________________________________________________    ASSESSMENT  1. 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 Cancer associated abdominal pain - improving  3. Opioid Induced Constipation   4. Chemo neuropathy, grade 1, ctm  5. Chemotherapy induced neutropenia - managed with GCSF  6. Chemotherapy induced thrombocytopenia    RECOMMENDATIONS  1. mFOLIRINOX + GCSF support    -FOLFIRI only today  2. Oxycodone 5mg  every 4 hours prn   3. Continue Miralax and Senna - increase senna to bid prn  4. Scheduled dex x 3 days post chemo, zofran, compazine prn  5. RTC 2 weeks for treatment, 4 weeks for labs, visit and treatment. [Pump disconnect at [place]]    DISCUSSION  Norma Green is doing well on treatment.  Reviewed natural progression of side effects - and managing those, rationale for adjusting treatment regimen today.  We discussed risk vs benefit of ongoing treatment and side effect management.  Her neuropathy is quite minimal.  We did discuss that we are likely getting close to a time that we will have to scale back to a 2 drug regimen due to toxicities whether that's related to counts or progressive neuropathy.      Decision-making today was high complexity on the basis of discussion of cancer, and weighing use of high risk chemotherapy, toxicity and complications/comorbidity of metastatic pancreatic cancer.  ______________________________________________________________________  HISTORY     Norma Green is seen today at the [facility] for ongoing management regarding pancreatic cancer.     She is doing well, feels she is tolerating treatments well.  Energy has been good.  Recently was at her niece's wedding, had a great time.  Eating well, weight stable.  Bowels are good.  She does have very little numbness to fingertips that comes and goes.  Nothing to her feet.  Not impacting ADLs.      MEDICAL HISTORY  1. HTN  2. Anxiety    Allergies   Allergen Reactions    Metronidazole Nausea And Vomiting     Medications reviewed in the EMR    SOCIAL HISTORY  Lives with family including husband. Smoker. No alcohol or drug use.  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 New grandbaby on the way 03/2022    PHYSICAL EXAM  Vitals in emr  GENERAL: well developed, well nourished, in no distress  PSYCH: full and appropriate range of affect with good insight and judgement  HEENT: NCAT, pupils equal, sclerae anicteric  LYMPH: no cervical, supraclavicular, periumbilical adenopathy  EXT: warm and well perfused, no edema  SKIN: No rashes    OBJECTIVE DATA  LABS  Labs reviewed in emr  Plts 84  RADIOLOGY RESULTS   CT scan impression:  Stable panc mass, left adrenal nodule, and hepatic lesions

## 2022-04-25 NOTE — Unmapped (Signed)
Hospital Outpatient Visit on 04/25/2022   Component Date Value Ref Range Status    Sodium 04/25/2022 145  135 - 145 mmol/L Final    Potassium 04/25/2022 3.6  3.5 - 5.1 mmol/L Final    Chloride 04/25/2022 109 (H)  98 - 107 mmol/L Final    CO2 04/25/2022 29.0  20.0 - 31.0 mmol/L Final    Anion Gap 04/25/2022 7  5 - 14 mmol/L Final    BUN 04/25/2022 13  9 - 23 mg/dL Final    Creatinine 16/05/9603 0.65  0.60 - 0.80 mg/dL Final    BUN/Creatinine Ratio 04/25/2022 20   Final    eGFR CKD-EPI (2021) Female 04/25/2022 >90  >=60 mL/min/1.26m2 Final    eGFR calculated with CKD-EPI 2021 equation in accordance with [employer] and [employer] of Nephrology Task Force recommendations.    Glucose 04/25/2022 161  70 - 179 mg/dL Final    Calcium 54/04/8118 8.9  8.7 - 10.4 mg/dL Final    Albumin 14/78/2956 3.2 (L)  3.4 - 5.0 g/dL Final    Total Protein 04/25/2022 6.3  5.7 - 8.2 g/dL Final    Total Bilirubin 04/25/2022 0.3  0.3 - 1.2 mg/dL Final    AST 21/30/8657 28  <=34 U/L Final    ALT 04/25/2022 35  10 - 49 U/L Final    Alkaline Phosphatase 04/25/2022 148 (H)  46 - 116 U/L Final    Magnesium 04/25/2022 1.6  1.6 - 2.6 mg/dL Final    CA 84-6 96/29/5284 14.45  0 - 35 U/mL Final    WBC 04/25/2022 7.2  3.6 - 11.2 10*9/L Final    RBC 04/25/2022 3.15 (L)  3.95 - 5.13 10*12/L Final    HGB 04/25/2022 9.8 (L)  11.3 - 14.9 g/dL Final    HCT 13/24/4010 29.4 (L)  34.0 - 44.0 % Final    MCV 04/25/2022 93.4  77.6 - 95.7 fL Final    MCH 04/25/2022 31.1  25.9 - 32.4 pg Final    MCHC 04/25/2022 33.3  32.0 - 36.0 g/dL Final    RDW 27/25/3664 17.1 (H)  12.2 - 15.2 % Final    MPV 04/25/2022 8.3  6.8 - 10.7 fL Final    Platelet 04/25/2022 84 (L)  150 - 450 10*9/L Final    Neutrophils % 04/25/2022 71.1  % Final    Lymphocytes % 04/25/2022 20.6  % Final    Monocytes % 04/25/2022 7.4  % Final    Eosinophils % 04/25/2022 0.6  % Final    Basophils % 04/25/2022 0.3  % Final    Absolute Neutrophils 04/25/2022 5.1  1.8 - 7.8 10*9/L Final Absolute Lymphocytes 04/25/2022 1.5  1.1 - 3.6 10*9/L Final    Absolute Monocytes 04/25/2022 0.5  0.3 - 0.8 10*9/L Final    Absolute Eosinophils 04/25/2022 0.0  0.0 - 0.5 10*9/L Final    Absolute Basophils 04/25/2022 0.0  0.0 - 0.1 10*9/L Final    Anisocytosis 04/25/2022 Slight (A)  Not Present Final          RED ZONE Means: RED ZONE: Take action now!     You need to be seen right away  Symptoms are at a severe level of discomfort    Call 911 or go to your nearest  Hospital for help     - Bleeding that will not stop    - Hard to breathe    - New seizure - Chest pain  - Fall or passing out  -  Thoughts of hurting    yourself or others      Call 911 if you are going into the RED ZONE                  YELLOW ZONE Means:     Please call with any new or worsening symptom(s), even if not on this list.  Call [phone]  After hours, weekends, and holidays - you will reach a long recording with specific instructions, If not in an emergency such as above, please listen closely all the way to the end and choose the option that relates to your need.   You can be seen by a provider the same day through our Same Day Acute Care for Patients with Cancer program.      YELLOW ZONE: Take action today     Symptoms are new or worsening  You are not within your goal range for:    - Pain    - Shortness of breath    - Bleeding (nose, urine, stool, wound)    - Feeling sick to your stomach and throwing up    - Mouth sores/pain in your mouth or throat    - Hard stool or very loose stools (increase in       ostomy output)    - No urine for 12 hours    - Feeding tube or other catheter/tube issue    - Redness or pain at previous IV or port/catheter site    - Depressed or anxiety   - Swelling (leg, arm, abdomen,     face, neck)  - Skin rash or skin changes  - Wound issues (redness, drainage,    re-opened)  - Confusion  - Vision changes  - Fever >100.4 F or chills  - Worsening cough with mucus that is    green, yellow, or bloody  - Pain or burning when going to the    bathroom  - Home Infusion Pump Issue- call    [phone]         Call your healthcare provider if you are going into the YELLOW ZONE     GREEN ZONE Means:  Your symptoms are under controls  Continue to take your medicine as ordered  Keep all visits to the provider GREEN ZONE: You are in control  No increase or worsening symptoms  Able to take your medicine  Able to drink and eat    - DO NOT use MyChart messages to report red or yellow symptoms. Allow up to 3    business days for a reply.  -MyChart is for non-urgent medication refills, scheduling requests, or other general questions.         [id number] Rev. 02/21/2022  Approved by Oncology Patient Education Committee

## 2022-04-26 ENCOUNTER — Ambulatory Visit: Admit: 2022-04-26 | Payer: PRIVATE HEALTH INSURANCE

## 2022-04-26 NOTE — Unmapped (Signed)
Regarding: Bleeding from something on her side.  ----- Message from Gevena Cotton sent at 04/25/2022  8:23 PM EDT -----  If you had not called [facility], what do you think you would have done?   Go to Emergency Dept

## 2022-04-26 NOTE — Unmapped (Signed)
Patient presented for mFOLFIRINOX today. Lab check came back with platelet of 84,000; per Dr Forbes Cellar, oxaliplatin will be held today but ok to continue rest of treatment plan. Norma Fried, NP came and spoke with the patient and husband as they had lots of questions regarding the treatment plan change and lab results. Patient was given atropine before starting irinotecan due to previous incidents of cholinergic reactions; during today's treatment, had a sudden case of cramping and urge to run to the bathroom at the end of the treatment and needed to get a 2nd dose as well. Patient denied any pain or shortness of breath during treatment; no s/s of reaction noted. Patient tolerated treatment well and was stable at time of discharge; self-ambulated to lobby with spouse.

## 2022-04-26 NOTE — Unmapped (Signed)
Upcoming Appt:  Future Appointments   Date Time Provider Department Center   04/27/2022  1:00 PM ONCINF CHAIR 11 [facility]   05/09/2022  7:30 AM ONCINF CHAIR 17 [facility]   05/11/2022 12:30 PM ONCINF CHAIR 02 [facility]   05/23/2022  8:30 AM ADULT ONC LAB [facility]   05/23/2022  9:30 AM Tammy Tobey Bride, ANP ONCMULTI [facility]       Recent:   What is the date of your last related visit?  04/25/22 last Onc. Office visit  Related acute medications Rx'd:  n/a  Home treatment tried:  n/a       Relevant:   Allergies: Metronidazole  Medications: Decadron, Zofran, Compazine    Health History: Pancreatic adenocarcinoma, HTN, anxiety   Weight:   Wt Readings from Last 3 Encounters:   04/25/22 63.2 kg (139 lb 5.3 oz)   04/11/22 61.5 kg (135 lb 9.3 oz)   03/21/22 61.1 kg (134 lb 11.2 oz)       /[facility] patients only:  What was the date of your last cancer treatment (mm/dd/yy)?: n/a  Was the treatment oral or infusion?: n/a  Are you currently on TVEC (yes/no)?: n/a    Reason for Disposition   Patient sounds very sick or weak to the triager     UNABLE TO COMPLETE STEM QUESTIONS DUE TO FAMILY PREFERENCE.     Family wanted to know Oncology clinic preferred ER.   Family very concerned and did not want to complete triage and just wanted to proceed to take pt. To ER. Informed family that Oncology office prefers [facility] ED.   Encouraged to call back anytime with further questions or concerns.    Answer Assessment - Initial Assessment Questions  1. SYMPTOM:  What's the main symptom you're concerned about? (e.g., pain, redness, swelling, pus)     Caller initially reports bleeding from port site - Later it is made clear that there is just some back flow of blood in port/ IV tubing.   2. ONSET: When did the bleeding  start?      04/25/22 @ 2010 (20 minutes prior to call)   3. IV TYPE: What kind of IV line do you have? (e.g., central line, PICC, peripheral IV)      Chemo. Port   4. IV LOCATION - SITE: Where does the IV enter your body?      Right chest port  5. IV START DATE: When was this IV put in?      Port has been in for 1-2 months. Had chemo infusion 04/25/22 and is currently infusing.    6. IV REASON: Why do you have this IV line?      Cancer/ chemo infusion   7. IV FUNCTION: Describe how the IV is running? (e.g., running normally, running slowly, not running, unable to flush)       Reports that pump is running smoothly, not giving any error messages   8. PAIN: Is there any pain? If Yes, ask: How bad is the pain? (e.g., scale 1-10; or mild, moderate, severe) Describe the pain. (e.g., burning, throbbing, shooting, sharp, etc.)    - NONE (0): no pain    - MILD (1-3): doesn't interfere with normal activities     - MODERATE (4-7): interferes with normal activities or awakens from sleep     - SEVERE (8-10): excruciating pain, unable to do any normal activities       Denies  9. SWELLING: Is there any swelling at your IV site?       Denies   10. FEVER: Do you have a fever? If Yes, ask: What is your temperature, how was it measured, and when did it start?         Denies   11. OTHER SYMPTOMS: Do you have any other symptoms? (e.g., shaking chills, weakness)        Pt. Feels fine   12. VISITING NURSE: Do you have a visiting nurse? (e.g., home health nurse, IV infusion nurse)        Denies, PT. Has next appt. On Sunday 04/27/22  13. PUMP: Is it on a pump? If Yes,, ask: Is there an alarm and what is the message?        Currently running on pump, denies any alarm messages     Family reports that the IV tubing  popped out and landed on patients shirt then was re-connected to port to complete infusion.    Protocols used: IV Site and Other Symptoms-A-AH

## 2022-04-27 ENCOUNTER — Ambulatory Visit: Admit: 2022-04-27 | Discharge: 2022-04-28 | Payer: PRIVATE HEALTH INSURANCE

## 2022-04-27 MED ADMIN — heparin, porcine (PF) 100 unit/mL injection 500 Units: 500 [IU] | INTRAVENOUS | @ 17:00:00 | Stop: 2022-04-28

## 2022-04-27 NOTE — Unmapped (Signed)
Pt to clinic for pump disconnect.  CADD pump infusion completed.  Pump disconnected.  Port flushed and de-accessed, pt tolerated well.  Pt left via wheelchair with spouse.

## 2022-05-09 ENCOUNTER — Ambulatory Visit: Admit: 2022-05-09 | Discharge: 2022-05-09 | Payer: PRIVATE HEALTH INSURANCE

## 2022-05-09 LAB — CBC W/ AUTO DIFF
BASOPHILS ABSOLUTE COUNT: 0.1 10*9/L (ref 0.0–0.1)
BASOPHILS RELATIVE PERCENT: 0.8 %
EOSINOPHILS ABSOLUTE COUNT: 0 10*9/L (ref 0.0–0.5)
EOSINOPHILS RELATIVE PERCENT: 0.5 %
HEMATOCRIT: 32 % — ABNORMAL LOW (ref 34.0–44.0)
HEMOGLOBIN: 10.8 g/dL — ABNORMAL LOW (ref 11.3–14.9)
LYMPHOCYTES ABSOLUTE COUNT: 1.9 10*9/L (ref 1.1–3.6)
LYMPHOCYTES RELATIVE PERCENT: 22.5 %
MEAN CORPUSCULAR HEMOGLOBIN CONC: 33.6 g/dL (ref 32.0–36.0)
MEAN CORPUSCULAR HEMOGLOBIN: 31.1 pg (ref 25.9–32.4)
MEAN CORPUSCULAR VOLUME: 92.6 fL (ref 77.6–95.7)
MEAN PLATELET VOLUME: 8.2 fL (ref 6.8–10.7)
MONOCYTES ABSOLUTE COUNT: 0.8 10*9/L (ref 0.3–0.8)
MONOCYTES RELATIVE PERCENT: 9.2 %
NEUTROPHILS ABSOLUTE COUNT: 5.6 10*9/L (ref 1.8–7.8)
NEUTROPHILS RELATIVE PERCENT: 67 %
PLATELET COUNT: 128 10*9/L — ABNORMAL LOW (ref 150–450)
RED BLOOD CELL COUNT: 3.46 10*12/L — ABNORMAL LOW (ref 3.95–5.13)
RED CELL DISTRIBUTION WIDTH: 15.2 % (ref 12.2–15.2)
WBC ADJUSTED: 8.4 10*9/L (ref 3.6–11.2)

## 2022-05-09 LAB — COMPREHENSIVE METABOLIC PANEL
ALBUMIN: 3.4 g/dL (ref 3.4–5.0)
ALKALINE PHOSPHATASE: 172 U/L — ABNORMAL HIGH (ref 46–116)
ALT (SGPT): 39 U/L (ref 10–49)
ANION GAP: 6 mmol/L (ref 5–14)
AST (SGOT): 33 U/L (ref <=34)
BILIRUBIN TOTAL: 0.2 mg/dL — ABNORMAL LOW (ref 0.3–1.2)
BLOOD UREA NITROGEN: 13 mg/dL (ref 9–23)
BUN / CREAT RATIO: 21
CALCIUM: 9.3 mg/dL (ref 8.7–10.4)
CHLORIDE: 110 mmol/L — ABNORMAL HIGH (ref 98–107)
CO2: 29 mmol/L (ref 20.0–31.0)
CREATININE: 0.63 mg/dL
EGFR CKD-EPI (2021) FEMALE: >90 mL/min/{1.73_m2} (ref >=60)
GLUCOSE RANDOM: 104 mg/dL (ref 70–179)
POTASSIUM: 3.8 mmol/L (ref 3.4–4.8)
PROTEIN TOTAL: 6.9 g/dL (ref 5.7–8.2)
SODIUM: 145 mmol/L (ref 135–145)

## 2022-05-09 LAB — MAGNESIUM: MAGNESIUM: 1.8 mg/dL (ref 1.6–2.6)

## 2022-05-09 MED ORDER — OXYCODONE 5 MG TABLET
ORAL_TABLET | Freq: Four times a day (QID) | ORAL | 0 refills | 23 days | Status: CP | PRN
Start: 2022-05-09 — End: 2022-06-08

## 2022-05-09 MED ADMIN — dexAMETHasone (DECADRON) tablet 20 mg: 20 mg | ORAL | @ 14:00:00 | Stop: 2022-05-09

## 2022-05-09 MED ADMIN — leuCOVorin 664 mg in dextrose 5 % 50 mL IVPB: 400 mg/m2 | INTRAVENOUS | @ 15:00:00 | Stop: 2022-05-09

## 2022-05-09 MED ADMIN — dextrose 5 % infusion: 100 mL/h | INTRAVENOUS | @ 15:00:00

## 2022-05-09 MED ADMIN — fluorouracil (ADRUCIL) 4,000 mg in sodium chloride (NS) 0.9 % 46-hr infusion CADD: 2400 mg/m2 | INTRAVENOUS | @ 17:00:00

## 2022-05-09 MED ADMIN — irinotecan (CAMPTOSAR) 249 mg in dextrose 5 % 500 mL IVPB: 150 mg/m2 | INTRAVENOUS | @ 15:00:00 | Stop: 2022-05-09

## 2022-05-09 MED ADMIN — ondansetron (ZOFRAN) tablet 24 mg: 24 mg | ORAL | @ 14:00:00 | Stop: 2022-05-09

## 2022-05-09 MED ADMIN — atropine injection: .4 mg | INTRAVENOUS | @ 15:00:00

## 2022-05-09 NOTE — Unmapped (Signed)
Labs found to be within parameters for treatment today. Oxaliplatin removed from plan by Dr. Geni Bers due to platelets=84 today. Request for drug sent to pharmacy.

## 2022-05-09 NOTE — Unmapped (Signed)
0930: Pt here for scheduled infusion.     1240: Pt tolerated treatment/infusion W/O difficulty. Port left accessed per protocol for home infusion.   Pt left infusion center ambulatory. NAD, no questions nor complaints voiced at D/C. Pt aware of follow up

## 2022-05-11 ENCOUNTER — Ambulatory Visit: Admit: 2022-05-11 | Discharge: 2022-05-12 | Payer: PRIVATE HEALTH INSURANCE

## 2022-05-11 MED ADMIN — heparin, porcine (PF) 100 unit/mL injection 500 Units: 500 [IU] | INTRAVENOUS | @ 16:00:00 | Stop: 2022-05-12

## 2022-05-11 NOTE — Unmapped (Signed)
Pt to clinic for pump disconnect.  CADD pump infusion complete.  CADD pump disconnected.  Port flushed and heparin.  Pt left via wheelchair with spouse.

## 2022-05-11 NOTE — Unmapped (Addendum)
No visits with results within 1 Day(s) from this visit.   Latest known visit with results is:   Hospital Outpatient Visit on 05/09/2022   Component Date Value Ref Range Status    Sodium 05/09/2022 145  135 - 145 mmol/L Final    Potassium 05/09/2022 3.8  3.4 - 4.8 mmol/L Final    Chloride 05/09/2022 110 (H)  98 - 107 mmol/L Final    CO2 05/09/2022 29.0  20.0 - 31.0 mmol/L Final    Anion Gap 05/09/2022 6  5 - 14 mmol/L Final    BUN 05/09/2022 13  9 - 23 mg/dL Final    Creatinine 16/05/9603 0.63  0.60 - 0.80 mg/dL Final    BUN/Creatinine Ratio 05/09/2022 21   Final    eGFR CKD-EPI (2021) Female 05/09/2022 >90  >=60 mL/min/1.42m2 Final    eGFR calculated with CKD-EPI 2021 equation in accordance with [employer] and [employer] of Nephrology Task Force recommendations.    Glucose 05/09/2022 104  70 - 179 mg/dL Final    Calcium 54/04/8118 9.3  8.7 - 10.4 mg/dL Final    Albumin 14/78/2956 3.4  3.4 - 5.0 g/dL Final    Total Protein 05/09/2022 6.9  5.7 - 8.2 g/dL Final    Total Bilirubin 05/09/2022 0.2 (L)  0.3 - 1.2 mg/dL Final    AST 21/30/8657 33  <=34 U/L Final    ALT 05/09/2022 39  10 - 49 U/L Final    Alkaline Phosphatase 05/09/2022 172 (H)  46 - 116 U/L Final    Magnesium 05/09/2022 1.8  1.6 - 2.6 mg/dL Final    WBC 84/69/6295 8.4  3.6 - 11.2 10*9/L Final    RBC 05/09/2022 3.46 (L)  3.95 - 5.13 10*12/L Final    HGB 05/09/2022 10.8 (L)  11.3 - 14.9 g/dL Final    HCT 28/41/3244 32.0 (L)  34.0 - 44.0 % Final    MCV 05/09/2022 92.6  77.6 - 95.7 fL Final    MCH 05/09/2022 31.1  25.9 - 32.4 pg Final    MCHC 05/09/2022 33.6  32.0 - 36.0 g/dL Final    RDW 08/27/7251 15.2  12.2 - 15.2 % Final    MPV 05/09/2022 8.2  6.8 - 10.7 fL Final    Platelet 05/09/2022 128 (L)  150 - 450 10*9/L Final    Neutrophils % 05/09/2022 67.0  % Final    Lymphocytes % 05/09/2022 22.5  % Final    Monocytes % 05/09/2022 9.2  % Final    Eosinophils % 05/09/2022 0.5  % Final    Basophils % 05/09/2022 0.8  % Final    Absolute Neutrophils 05/09/2022 5.6  1.8 - 7.8 10*9/L Final    Absolute Lymphocytes 05/09/2022 1.9  1.1 - 3.6 10*9/L Final    Absolute Monocytes 05/09/2022 0.8  0.3 - 0.8 10*9/L Final    Absolute Eosinophils 05/09/2022 0.0  0.0 - 0.5 10*9/L Final    Absolute Basophils 05/09/2022 0.1  0.0 - 0.1 10*9/L Final

## 2022-05-14 NOTE — Unmapped (Signed)
[facility] Central Navigation: Follow-Up    Completed with: Patient     Oncology Patient Navigator (OPN) provided follow-up call to review previously discussed resources/identified barriers and evaluate current needs.     Spoke to patient today, verifications have been returned at [facility]/financial application approved.   Patient is eating and drinking well, no unreported side effects related to treatment.  Patient is already deemed disabled, her only income is from disability. Gas cards already provided.   Due to low income, in basket sent to LCSW pool for evaluation.     No transportation, emotional or food insecurity at this time.    Patient endorsed understanding of upcoming appointment times/location as well as how to contact medical team if needed.     Additional follow-up call will occur in the coming weeks.

## 2022-05-21 NOTE — Unmapped (Signed)
[facility] Refill Coordination Note    Specialty Medication(s) to be Shipped:   Hematology/Oncology: Norma Green    Other medication(s) to be shipped: No additional medications requested for fill at this time     Norma Green, DOB: [birth year]  Phone: [phone] (home)       All above HIPAA information was verified with patient.     Was a Nurse, learning disability used for this call? No    Completed refill call assessment today to schedule patient's medication shipment from the [facility] [phone]).  All relevant notes have been reviewed.     Specialty medication(s) and dose(s) confirmed: Regimen is correct and unchanged.   Changes to medications: [name] reports no changes at this time.  Changes to insurance: No  New side effects reported not previously addressed with a pharmacist or physician: None reported  Questions for the pharmacist: No    Confirmed patient received a Conservation officer, historic buildings and a Surveyor, mining with first shipment. The patient will receive a drug information handout for each medication shipped and additional FDA Medication Guides as required.       DISEASE/MEDICATION-SPECIFIC INFORMATION        For patients on injectable medications: Patient currently has 1 doses left.  Next injection is scheduled for 05/27/22.    SPECIALTY MEDICATION ADHERENCE     Medication Adherence    Patient reported X missed doses in the last month: 0  Specialty Medication: Udenyca 6mg /0.31mL  Patient is on additional specialty medications: No  Informant: patient                       Were doses missed due to medication being on hold? No    Udenyca 6mg /0.62mL: 0 days of medicine on hand       REFERRAL TO PHARMACIST     Referral to the pharmacist: Not needed      [facility]     Shipping address confirmed in Epic.     Delivery Scheduled: Yes, Expected medication delivery date: 05/22/22.     Medication will be delivered via Same Day Courier to the prescription address in Epic [place].    Norma Green   [facility] Specialty Technician

## 2022-05-22 MED FILL — UDENYCA 6 MG/0.6 ML SUBCUTANEOUS SYRINGE: SUBCUTANEOUS | 28 days supply | Qty: 1.2 | Fill #2

## 2022-05-22 NOTE — Unmapped (Signed)
[facility]     PRIMARY CARE PROVIDER:  Jaci Carrel, ANP  [address] Physician Office [place] [address]    CONSULTING PROVIDERS  [facility] Biliary Team  ____________________________________________________________________    CANCER HISTORY  Diagnosis: Pancreatic Adenocarcinoma, imaging concerning for metastatic disease to liver  1. 12/12/21: Presented with abdominal pain, weight loss found to have CBD obstruction with both pancreatic head and tail mass along with liver lesion. EUS/ERCP with CBD stent placement.  Biopsy of pancreatic tail mass with adenocarcinoma. Concern for metastatic disease with 1 cm liver lesion.  Also, local extension to left adrenal.   2. 5/4 start FOLFIRINOX (Cy 1 FOLFOX)    Molecular:  Somatic: KRAS, TP53, CCND3, TFEB, VEGFA, TMP 6.3 m/MB  Germline: negative   ____________________________________________________________________    ASSESSMENT  1. 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 Cancer associated abdominal pain - improving  3. Opioid Induced Constipation   4. Chemo neuropathy, grade 1, ctm  5. Chemotherapy induced neutropenia - managed with GCSF  6. Chemotherapy induced thrombocytopenia    RECOMMENDATIONS  1. mFOLIRINOX-->de-escalate to FOLFIRI + GCSF support   2. Oxycodone 5mg  every 4 hours prn   3. Continue Miralax and Senna - increase senna to bid prn  4. Scheduled dex x 3 days post chemo, zofran, compazine prn  5. RTC 2 weeks for treatment, 4 weeks for Ct scan, labs, visit and treatment. [Pump disconnect at ]    DISCUSSION  Eliane     Decision-making today was high complexity on the basis of discussion of cancer, and weighing use of high risk chemotherapy, toxicity and complications/comorbidity of metastatic pancreatic cancer.  ______________________________________________________________________  HISTORY     Norma Green is seen today at the [facility] for Merrie L Weintraub is seen today at the [facility] for ongoing management regarding pancreatic cancer.     Jeimmy comes in today.  Treatments going ok.  Energy is ok.  Appetite has been very good.  No issues with nausea now, hasn't had to take nausea meds.  Denies abd pain.  Bowels have been fairly regular, hasn't had to take bowel regimen.  Mild numbness/tingling to fingers and toes.     MEDICAL HISTORY  1. HTN  2. Anxiety    Allergies   Allergen Reactions    Metronidazole Nausea And Vomiting     Medications reviewed in the EMR    SOCIAL HISTORY  Lives with family including husband. Smoker. No alcohol or drug use.  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 New grandbaby on the way 03/2022    PHYSICAL EXAM  Vitals:    05/23/22 0922   BP: (S) 161/77   Pulse: 77   Resp: 18   Temp: 36.8 ??C (98.2 ??F)   SpO2: 100%     GENERAL: well developed, well nourished, in no distress  PSYCH: full and appropriate range of affect with good insight and judgement  HEENT: NCAT, pupils equal, sclerae anicteric  LYMPH: no cervical, supraclavicular, periumbilical adenopathy  EXT: warm and well perfused, no edema  SKIN: No rashes    OBJECTIVE DATA  LABS  Labs reviewed in emr, ok for treatment  Plts 123     RADIOLOGY RESULTS   None today

## 2022-05-23 ENCOUNTER — Other Ambulatory Visit: Admit: 2022-05-23 | Discharge: 2022-05-24 | Payer: PRIVATE HEALTH INSURANCE

## 2022-05-23 ENCOUNTER — Ambulatory Visit: Admit: 2022-05-23 | Discharge: 2022-05-24 | Payer: PRIVATE HEALTH INSURANCE

## 2022-05-23 DIAGNOSIS — D696 Thrombocytopenia, unspecified: Principal | ICD-10-CM

## 2022-05-23 DIAGNOSIS — D701 Agranulocytosis secondary to cancer chemotherapy: Principal | ICD-10-CM

## 2022-05-23 DIAGNOSIS — G620 Drug-induced polyneuropathy: Principal | ICD-10-CM

## 2022-05-23 DIAGNOSIS — T451X5A Adverse effect of antineoplastic and immunosuppressive drugs, initial encounter: Principal | ICD-10-CM

## 2022-05-23 DIAGNOSIS — C259 Malignant neoplasm of pancreas, unspecified: Principal | ICD-10-CM

## 2022-05-23 DIAGNOSIS — C787 Secondary malignant neoplasm of liver and intrahepatic bile duct: Principal | ICD-10-CM

## 2022-05-23 LAB — CBC W/ AUTO DIFF
BASOPHILS ABSOLUTE COUNT: 0 10*9/L (ref 0.0–0.1)
BASOPHILS RELATIVE PERCENT: 0.3 %
EOSINOPHILS ABSOLUTE COUNT: 0.1 10*9/L (ref 0.0–0.5)
EOSINOPHILS RELATIVE PERCENT: 0.9 %
HEMATOCRIT: 32.8 % — ABNORMAL LOW (ref 34.0–44.0)
HEMOGLOBIN: 10.7 g/dL — ABNORMAL LOW (ref 11.3–14.9)
LYMPHOCYTES ABSOLUTE COUNT: 1.5 10*9/L (ref 1.1–3.6)
LYMPHOCYTES RELATIVE PERCENT: 20.8 %
MEAN CORPUSCULAR HEMOGLOBIN CONC: 32.7 g/dL (ref 32.0–36.0)
MEAN CORPUSCULAR HEMOGLOBIN: 30.1 pg (ref 25.9–32.4)
MEAN CORPUSCULAR VOLUME: 91.8 fL (ref 77.6–95.7)
MEAN PLATELET VOLUME: 8 fL (ref 6.8–10.7)
MONOCYTES ABSOLUTE COUNT: 0.6 10*9/L (ref 0.3–0.8)
MONOCYTES RELATIVE PERCENT: 8 %
NEUTROPHILS ABSOLUTE COUNT: 5 10*9/L (ref 1.8–7.8)
NEUTROPHILS RELATIVE PERCENT: 70 %
PLATELET COUNT: 123 10*9/L — ABNORMAL LOW (ref 150–450)
RED BLOOD CELL COUNT: 3.57 10*12/L — ABNORMAL LOW (ref 3.95–5.13)
RED CELL DISTRIBUTION WIDTH: 13.8 % (ref 12.2–15.2)
WBC ADJUSTED: 7.2 10*9/L (ref 3.6–11.2)

## 2022-05-23 LAB — COMPREHENSIVE METABOLIC PANEL
ALBUMIN: 3.3 g/dL — ABNORMAL LOW (ref 3.4–5.0)
ALKALINE PHOSPHATASE: 167 U/L — ABNORMAL HIGH (ref 46–116)
ALT (SGPT): 23 U/L (ref 10–49)
ANION GAP: 10 mmol/L (ref 5–14)
AST (SGOT): 20 U/L (ref <=34)
BILIRUBIN TOTAL: 0.2 mg/dL — ABNORMAL LOW (ref 0.3–1.2)
BLOOD UREA NITROGEN: 14 mg/dL (ref 9–23)
BUN / CREAT RATIO: 19
CALCIUM: 9.2 mg/dL (ref 8.7–10.4)
CHLORIDE: 107 mmol/L (ref 98–107)
CO2: 28 mmol/L (ref 20.0–31.0)
CREATININE: 0.73 mg/dL
EGFR CKD-EPI (2021) FEMALE: >90 mL/min/{1.73_m2} (ref >=60)
GLUCOSE RANDOM: 171 mg/dL (ref 70–179)
POTASSIUM: 3.7 mmol/L (ref 3.5–5.1)
PROTEIN TOTAL: 6.5 g/dL (ref 5.7–8.2)
SODIUM: 145 mmol/L (ref 135–145)

## 2022-05-23 LAB — MAGNESIUM: MAGNESIUM: 1.7 mg/dL (ref 1.6–2.6)

## 2022-05-23 LAB — CANCER ANTIGEN 19-9: CA 19-9: 14.67 U/mL (ref 0–35)

## 2022-05-23 MED ADMIN — dexAMETHasone (DECADRON) tablet 20 mg: 20 mg | ORAL | @ 17:00:00 | Stop: 2022-05-23

## 2022-05-23 MED ADMIN — fluorouracil (ADRUCIL) 4,000 mg in sodium chloride (NS) 0.9 % 46-hr infusion CADD: 2400 mg/m2 | INTRAVENOUS | @ 20:00:00

## 2022-05-23 MED ADMIN — dextrose 5 % infusion: 100 mL/h | INTRAVENOUS | @ 18:00:00

## 2022-05-23 MED ADMIN — irinotecan (CAMPTOSAR) 249 mg in dextrose 5 % 500 mL IVPB: 150 mg/m2 | INTRAVENOUS | @ 18:00:00 | Stop: 2022-05-23

## 2022-05-23 MED ADMIN — atropine injection: .4 mg | INTRAVENOUS | @ 18:00:00

## 2022-05-23 MED ADMIN — leuCOVorin 664 mg in dextrose 5 % 50 mL IVPB: 400 mg/m2 | INTRAVENOUS | @ 18:00:00 | Stop: 2022-05-23

## 2022-05-23 MED ADMIN — ondansetron (ZOFRAN) tablet 24 mg: 24 mg | ORAL | @ 17:00:00 | Stop: 2022-05-23

## 2022-05-23 NOTE — Unmapped (Signed)
Latest Reference Range & Units 05/09/22 09:01 05/23/22 08:34   WBC 3.6 - 11.2 10*9/L 8.4 7.2   RBC 3.95 - 5.13 10*12/L 3.46 (L) 3.57 (L)   HGB 11.3 - 14.9 g/dL 16.1 (L) 09.6 (L)   HCT 34.0 - 44.0 % 32.0 (L) 32.8 (L)   MCV 77.6 - 95.7 fL 92.6 91.8   MCH 25.9 - 32.4 pg 31.1 30.1   MCHC 32.0 - 36.0 g/dL 04.5 40.9   RDW 81.1 - 15.2 % 15.2 13.8   MPV 6.8 - 10.7 fL 8.2 8.0   Platelet 150 - 450 10*9/L 128 (L) 123 (L)   Neutrophils % % 67.0 70.0   Lymphocytes % % 22.5 20.8   Monocytes % % 9.2 8.0   Eosinophils % % 0.5 0.9   Basophils % % 0.8 0.3   Absolute Neutrophils 1.8 - 7.8 10*9/L 5.6 5.0   Absolute Lymphocytes 1.1 - 3.6 10*9/L 1.9 1.5   Absolute Monocytes  0.3 - 0.8 10*9/L 0.8 0.6   Absolute Eosinophils 0.0 - 0.5 10*9/L 0.0 0.1   Absolute Basophils  0.0 - 0.1 10*9/L 0.1 0.0   Sodium 135 - 145 mmol/L 145 145   Potassium 3.5 - 5.1 mmol/L 3.8 3.7   Chloride 98 - 107 mmol/L 110 (H) 107   CO2 20.0 - 31.0 mmol/L 29.0 28.0   Bun 9 - 23 mg/dL 13 14   Creatinine 9.14 - 0.80 mg/dL 7.82 9.56   (L): Data is abnormally low  (H): Data is abnormally high

## 2022-05-23 NOTE — Unmapped (Unsigned)
Port accessed.  Labs drawn & sent for analysis.  To next appt.  Care provided by Meghan C.

## 2022-05-23 NOTE — Unmapped (Signed)
Outpatient Oncology Social Work  Follow Up       Oncology Treatment   Pt has metastatic pancreatic cancer.  Receives chemo infusions every 14 days.    Psychosocial Situation  Pt lives with her husband in [place], [place].  She is disabled and receives SSI income.  Pt's husband is retired and sole income is Tree surgeon b/c he had to quit his part time job to take care of her.  Household income is below 250% FPL - and patient has Medicaid.    Financial Strain - [employer]  Patient is struggling with utility bills due to diminished household income.  Financial Resource Strain: High Risk (05/23/2022)    Overall Financial Resource Strain (CARDIA)     Difficulty of Paying Living Expenses: Hard   I provided an intervention for the Financial Resource Strain SDOH domain. The intervention was Assist with Applications for Services.  Informed Pt that she is eligible for CPAF assistance with utility bill. Her husband will email bill to this SW'er in order to submit request to CPAF.  This SW'er also provided Pt with the [employer] application along with copy of her provider visit note from today, which she will submit along with application and proof of household income for review by foundation.    Transportation  Pt is already receiving CPAF gas cards and parking passes for oncology clinic visits.    Emotional and Social Connections  I provided an intervention for the Social Connections SDOH domain. The intervention was Referral--     Pt is tearful and shares that she has no one to talk with about her cancer, and this has been isolating.  This SW'er provided patient with information about the [employer] Support group on the 2nd and 4th Tuesdays of the month.  Pt is very interested in attending the support group.  This SW'er emailed patient the evite to join the [employer] support group meeting.    Follow-Up Plan:  Pt has this SW'ers contact information and will reach out for psychosocial assistance as needed.     Claris Gladden, OSW-C  Oncology Outpatient Social Worker  Phone: [phone]

## 2022-05-24 NOTE — Unmapped (Signed)
1230: Pt here for scheduled infusion.     1550: Pt tolerated treatment/infusion W/O difficulty. Port left accessed per protocol.   Pt left infusion center via wheelchair. NAD, no questions nor complaints voiced at D/C. Pt aware of follow up.

## 2022-05-25 ENCOUNTER — Ambulatory Visit: Admit: 2022-05-25 | Discharge: 2022-05-26 | Payer: PRIVATE HEALTH INSURANCE

## 2022-05-25 MED ADMIN — heparin, porcine (PF) 100 unit/mL injection 500 Units: 500 [IU] | INTRAVENOUS | @ 18:00:00 | Stop: 2022-05-26

## 2022-05-25 NOTE — Unmapped (Signed)
Patient arrived to chair 5.  No complaints noted.  Access of port intact. Pump disconnected, port heparin flushed and deaccessed. AVS declined and patient was discharged to home.

## 2022-05-25 NOTE — Unmapped (Signed)
No visits with results within 1 Day(s) from this visit.   Latest known visit with results is:   Lab on 05/23/2022   Component Date Value Ref Range Status    Sodium 05/23/2022 145  135 - 145 mmol/L Final    Potassium 05/23/2022 3.7  3.5 - 5.1 mmol/L Final    Chloride 05/23/2022 107  98 - 107 mmol/L Final    CO2 05/23/2022 28.0  20.0 - 31.0 mmol/L Final    Anion Gap 05/23/2022 10  5 - 14 mmol/L Final    BUN 05/23/2022 14  9 - 23 mg/dL Final    Creatinine 16/05/9603 0.73  0.60 - 0.80 mg/dL Final    BUN/Creatinine Ratio 05/23/2022 19   Final    eGFR CKD-EPI (2021) Female 05/23/2022 >90  >=60 mL/min/1.51m2 Final    eGFR calculated with CKD-EPI 2021 equation in accordance with [employer] and [employer] of Nephrology Task Force recommendations.    Glucose 05/23/2022 171  70 - 179 mg/dL Final    Calcium 54/04/8118 9.2  8.7 - 10.4 mg/dL Final    Albumin 14/78/2956 3.3 (L)  3.4 - 5.0 g/dL Final    Total Protein 05/23/2022 6.5  5.7 - 8.2 g/dL Final    Total Bilirubin 05/23/2022 0.2 (L)  0.3 - 1.2 mg/dL Final    AST 21/30/8657 20  <=34 U/L Final    ALT 05/23/2022 23  10 - 49 U/L Final    Alkaline Phosphatase 05/23/2022 167 (H)  46 - 116 U/L Final    Magnesium 05/23/2022 1.7  1.6 - 2.6 mg/dL Final    CA 84-6 96/29/5284 14.67  0 - 35 U/mL Final    WBC 05/23/2022 7.2  3.6 - 11.2 10*9/L Final    RBC 05/23/2022 3.57 (L)  3.95 - 5.13 10*12/L Final    HGB 05/23/2022 10.7 (L)  11.3 - 14.9 g/dL Final    HCT 13/24/4010 32.8 (L)  34.0 - 44.0 % Final    MCV 05/23/2022 91.8  77.6 - 95.7 fL Final    MCH 05/23/2022 30.1  25.9 - 32.4 pg Final    MCHC 05/23/2022 32.7  32.0 - 36.0 g/dL Final    RDW 27/25/3664 13.8  12.2 - 15.2 % Final    MPV 05/23/2022 8.0  6.8 - 10.7 fL Final    Platelet 05/23/2022 123 (L)  150 - 450 10*9/L Final    Neutrophils % 05/23/2022 70.0  % Final    Lymphocytes % 05/23/2022 20.8  % Final    Monocytes % 05/23/2022 8.0  % Final    Eosinophils % 05/23/2022 0.9  % Final    Basophils % 05/23/2022 0.3  % Final    Absolute Neutrophils 05/23/2022 5.0  1.8 - 7.8 10*9/L Final    Absolute Lymphocytes 05/23/2022 1.5  1.1 - 3.6 10*9/L Final    Absolute Monocytes 05/23/2022 0.6  0.3 - 0.8 10*9/L Final    Absolute Eosinophils 05/23/2022 0.1  0.0 - 0.5 10*9/L Final    Absolute Basophils 05/23/2022 0.0  0.0 - 0.1 10*9/L Final

## 2022-05-26 NOTE — Unmapped (Signed)
Outpatient Oncology Social Work  Follow Up         [employer] - household bill  Pt requests assistance with household utility bill, and provides copy of bill ([employer] [phone]).  Completed CPAF gas card program application and provided to Cindie Crumbly, Production designer, theatre/television/film of [employer] Cancer Patient Assistance Fund (CPAF), with copy of bill for review.     Follow-Up Plan:  Pt has this SW'ers contact information and will reach out for psychosocial assistance as needed.     Claris Gladden, OSW-C  Oncology Outpatient Social Worker  Phone: [phone]

## 2022-05-27 NOTE — Unmapped (Signed)
Outpatient Oncology Social Work  Follow Up       [employer]  Informed Pt and her husband that CPAF paid their [employer] [phone]).     Follow-Up Plan:  Pt has this SW'ers contact information and will reach out for psychosocial assistance as needed.     Claris Gladden, OSW-C  Oncology Outpatient Social Worker  Phone: [phone]

## 2022-05-27 NOTE — Unmapped (Signed)
Patient Norma Green was called in attempt number 1 to reach them regarding scheduling their upcoming appointments on 07/04/22.     The call resulted in: Voicemail full    Please schedule the patient upon their return call on 11/10 for the following:     Lab Appointment, Provider, Same day scan(s) orders in, and Scan(s) 1-7 days prior to appointment    Thank you,    Janyth Pupa

## 2022-06-06 ENCOUNTER — Ambulatory Visit: Admit: 2022-06-06 | Discharge: 2022-06-07 | Payer: PRIVATE HEALTH INSURANCE

## 2022-06-06 LAB — COMPREHENSIVE METABOLIC PANEL
ALBUMIN: 3.4 g/dL (ref 3.4–5.0)
ALKALINE PHOSPHATASE: 172 U/L — ABNORMAL HIGH (ref 46–116)
ALT (SGPT): 29 U/L (ref 10–49)
ANION GAP: 5 mmol/L (ref 5–14)
AST (SGOT): 28 U/L (ref <=34)
BILIRUBIN TOTAL: 0.2 mg/dL — ABNORMAL LOW (ref 0.3–1.2)
BLOOD UREA NITROGEN: 13 mg/dL (ref 9–23)
BUN / CREAT RATIO: 20
CALCIUM: 8.8 mg/dL (ref 8.7–10.4)
CHLORIDE: 108 mmol/L — ABNORMAL HIGH (ref 98–107)
CO2: 29 mmol/L (ref 20.0–31.0)
CREATININE: 0.65 mg/dL
EGFR CKD-EPI (2021) FEMALE: >90 mL/min/{1.73_m2} (ref >=60)
GLUCOSE RANDOM: 133 mg/dL (ref 70–179)
POTASSIUM: 3.9 mmol/L (ref 3.4–4.8)
PROTEIN TOTAL: 6.8 g/dL (ref 5.7–8.2)
SODIUM: 142 mmol/L (ref 135–145)

## 2022-06-06 LAB — CBC W/ AUTO DIFF
BASOPHILS ABSOLUTE COUNT: 0 10*9/L (ref 0.0–0.1)
BASOPHILS RELATIVE PERCENT: 0.4 %
EOSINOPHILS ABSOLUTE COUNT: 0.1 10*9/L (ref 0.0–0.5)
EOSINOPHILS RELATIVE PERCENT: 1.3 %
HEMATOCRIT: 33.1 % — ABNORMAL LOW (ref 34.0–44.0)
HEMOGLOBIN: 10.9 g/dL — ABNORMAL LOW (ref 11.3–14.9)
LYMPHOCYTES ABSOLUTE COUNT: 1.4 10*9/L (ref 1.1–3.6)
LYMPHOCYTES RELATIVE PERCENT: 18.6 %
MEAN CORPUSCULAR HEMOGLOBIN CONC: 32.8 g/dL (ref 32.0–36.0)
MEAN CORPUSCULAR HEMOGLOBIN: 29.9 pg (ref 25.9–32.4)
MEAN CORPUSCULAR VOLUME: 91.2 fL (ref 77.6–95.7)
MEAN PLATELET VOLUME: 7.9 fL (ref 6.8–10.7)
MONOCYTES ABSOLUTE COUNT: 0.6 10*9/L (ref 0.3–0.8)
MONOCYTES RELATIVE PERCENT: 7.4 %
NEUTROPHILS ABSOLUTE COUNT: 5.4 10*9/L (ref 1.8–7.8)
NEUTROPHILS RELATIVE PERCENT: 72.3 %
PLATELET COUNT: 114 10*9/L — ABNORMAL LOW (ref 150–450)
RED BLOOD CELL COUNT: 3.63 10*12/L — ABNORMAL LOW (ref 3.95–5.13)
RED CELL DISTRIBUTION WIDTH: 13.5 % (ref 12.2–15.2)
WBC ADJUSTED: 7.5 10*9/L (ref 3.6–11.2)

## 2022-06-06 LAB — MAGNESIUM: MAGNESIUM: 1.8 mg/dL (ref 1.6–2.6)

## 2022-06-06 MED ADMIN — atropine injection: .4 mg | INTRAVENOUS | @ 14:00:00

## 2022-06-06 MED ADMIN — irinotecan (CAMPTOSAR) 249 mg in dextrose 5 % 500 mL IVPB: 150 mg/m2 | INTRAVENOUS | @ 15:00:00 | Stop: 2022-06-06

## 2022-06-06 MED ADMIN — leuCOVorin 664 mg in dextrose 5 % 50 mL IVPB: 400 mg/m2 | INTRAVENOUS | @ 15:00:00 | Stop: 2022-06-06

## 2022-06-06 MED ADMIN — dextrose 5 % infusion: 100 mL/h | INTRAVENOUS | @ 13:00:00

## 2022-06-06 MED ADMIN — fluorouracil (ADRUCIL) 4,000 mg in sodium chloride (NS) 0.9 % 46-hr infusion CADD: 2400 mg/m2 | INTRAVENOUS | @ 16:00:00

## 2022-06-06 MED ADMIN — dexAMETHasone (DECADRON) tablet 20 mg: 20 mg | ORAL | @ 13:00:00 | Stop: 2022-06-06

## 2022-06-06 MED ADMIN — ondansetron (ZOFRAN) tablet 24 mg: 24 mg | ORAL | @ 13:00:00 | Stop: 2022-06-06

## 2022-06-06 NOTE — Unmapped (Signed)
Pt tolerated chemo infusions without difficulty.  Ambulatory chemo pump connected without problems.  Pt was stable at discharge via wheelchair by SO

## 2022-06-06 NOTE — Unmapped (Signed)
RED ZONE Means: RED ZONE: Take action now!     You need to be seen right away  Symptoms are at a severe level of discomfort    Call 911 or go to your nearest  Hospital for help     - Bleeding that will not stop    - Hard to breathe    - New seizure - Chest pain  - Fall or passing out  -Thoughts of hurting    yourself or others      Call 911 if you are going into the RED ZONE                  YELLOW ZONE Means:     Please call with any new or worsening symptom(s), even if not on this list.  Call [phone]  After hours, weekends, and holidays - you will reach a long recording with specific instructions, If not in an emergency such as above, please listen closely all the way to the end and choose the option that relates to your need.   You can be seen by a provider the same day through our Same Day Acute Care for Patients with Cancer program.      YELLOW ZONE: Take action today     Symptoms are new or worsening  You are not within your goal range for:    - Pain    - Shortness of breath    - Bleeding (nose, urine, stool, wound)    - Feeling sick to your stomach and throwing up    - Mouth sores/pain in your mouth or throat    - Hard stool or very loose stools (increase in       ostomy output)    - No urine for 12 hours    - Feeding tube or other catheter/tube issue    - Redness or pain at previous IV or port/catheter site    - Depressed or anxiety   - Swelling (leg, arm, abdomen,     face, neck)  - Skin rash or skin changes  - Wound issues (redness, drainage,    re-opened)  - Confusion  - Vision changes  - Fever >100.4 F or chills  - Worsening cough with mucus that is    green, yellow, or bloody  - Pain or burning when going to the    bathroom  - Home Infusion Pump Issue- call    [phone]         Call your healthcare provider if you are going into the YELLOW ZONE     GREEN ZONE Means:  Your symptoms are under controls  Continue to take your medicine as ordered  Keep all visits to the provider GREEN ZONE: You are in control  No increase or worsening symptoms  Able to take your medicine  Able to drink and eat    - DO NOT use MyChart messages to report red or yellow symptoms. Allow up to 3    business days for a reply.  -MyChart is for non-urgent medication refills, scheduling requests, or other general questions.         [id number] Rev. 02/21/2022  Approved by Oncology Patient Education Committee        Hospital Outpatient Visit on 06/06/2022   Component Date Value Ref Range Status    Sodium 06/06/2022 142  135 - 145 mmol/L Final    Potassium 06/06/2022 3.9  3.4 - 4.8 mmol/L Final    Chloride 06/06/2022 108 (H)  98 - 107 mmol/L Final    CO2 06/06/2022 29.0  20.0 - 31.0 mmol/L Final    Anion Gap 06/06/2022 5  5 - 14 mmol/L Final    BUN 06/06/2022 13  9 - 23 mg/dL Final    Creatinine 60/45/4098 0.65  0.60 - 0.80 mg/dL Final    BUN/Creatinine Ratio 06/06/2022 20   Final    eGFR CKD-EPI (2021) Female 06/06/2022 >90  >=60 mL/min/1.74m2 Final    eGFR calculated with CKD-EPI 2021 equation in accordance with [employer] and [employer] of Nephrology Task Force recommendations.    Glucose 06/06/2022 133  70 - 179 mg/dL Final    Calcium 11/91/4782 8.8  8.7 - 10.4 mg/dL Final    Albumin 95/62/1308 3.4  3.4 - 5.0 g/dL Final    Total Protein 06/06/2022 6.8  5.7 - 8.2 g/dL Final    Total Bilirubin 06/06/2022 0.2 (L)  0.3 - 1.2 mg/dL Final    AST 65/78/4696 28  <=34 U/L Final    ALT 06/06/2022 29  10 - 49 U/L Final    Alkaline Phosphatase 06/06/2022 172 (H)  46 - 116 U/L Final    Magnesium 06/06/2022 1.8  1.6 - 2.6 mg/dL Final    WBC 29/52/8413 7.5  3.6 - 11.2 10*9/L Final    RBC 06/06/2022 3.63 (L)  3.95 - 5.13 10*12/L Final    HGB 06/06/2022 10.9 (L)  11.3 - 14.9 g/dL Final    HCT 24/40/1027 33.1 (L)  34.0 - 44.0 % Final    MCV 06/06/2022 91.2  77.6 - 95.7 fL Final    MCH 06/06/2022 29.9  25.9 - 32.4 pg Final    MCHC 06/06/2022 32.8  32.0 - 36.0 g/dL Final    RDW 25/36/6440 13.5  12.2 - 15.2 % Final    MPV 06/06/2022 7.9  6.8 - 10.7 fL Final    Platelet 06/06/2022 114 (L)  150 - 450 10*9/L Final    Neutrophils % 06/06/2022 72.3  % Final    Lymphocytes % 06/06/2022 18.6  % Final    Monocytes % 06/06/2022 7.4  % Final    Eosinophils % 06/06/2022 1.3  % Final    Basophils % 06/06/2022 0.4  % Final    Absolute Neutrophils 06/06/2022 5.4  1.8 - 7.8 10*9/L Final    Absolute Lymphocytes 06/06/2022 1.4  1.1 - 3.6 10*9/L Final    Absolute Monocytes 06/06/2022 0.6  0.3 - 0.8 10*9/L Final    Absolute Eosinophils 06/06/2022 0.1  0.0 - 0.5 10*9/L Final    Absolute Basophils 06/06/2022 0.0  0.0 - 0.1 10*9/L Final

## 2022-06-08 ENCOUNTER — Ambulatory Visit: Admit: 2022-06-08 | Discharge: 2022-06-09 | Payer: PRIVATE HEALTH INSURANCE

## 2022-06-08 MED ADMIN — heparin, porcine (PF) 100 unit/mL injection 500 Units: 500 [IU] | INTRAVENOUS | @ 14:00:00 | Stop: 2022-06-09

## 2022-06-08 NOTE — Unmapped (Signed)
Pt in triage for port de accesed.   Pump disconnected and sent home with pt.   Heparin locked port, + blood return  De accessed port. AVS declined, ambulatory for dc home

## 2022-06-08 NOTE — Unmapped (Signed)
No visits with results within 1 Day(s) from this visit.   Latest known visit with results is:   Hospital Outpatient Visit on 06/06/2022   Component Date Value Ref Range Status    Sodium 06/06/2022 142  135 - 145 mmol/L Final    Potassium 06/06/2022 3.9  3.4 - 4.8 mmol/L Final    Chloride 06/06/2022 108 (H)  98 - 107 mmol/L Final    CO2 06/06/2022 29.0  20.0 - 31.0 mmol/L Final    Anion Gap 06/06/2022 5  5 - 14 mmol/L Final    BUN 06/06/2022 13  9 - 23 mg/dL Final    Creatinine 16/05/9603 0.65  0.60 - 0.80 mg/dL Final    BUN/Creatinine Ratio 06/06/2022 20   Final    eGFR CKD-EPI (2021) Female 06/06/2022 >90  >=60 mL/min/1.5m2 Final    eGFR calculated with CKD-EPI 2021 equation in accordance with [employer] and [employer] of Nephrology Task Force recommendations.    Glucose 06/06/2022 133  70 - 179 mg/dL Final    Calcium 54/04/8118 8.8  8.7 - 10.4 mg/dL Final    Albumin 14/78/2956 3.4  3.4 - 5.0 g/dL Final    Total Protein 06/06/2022 6.8  5.7 - 8.2 g/dL Final    Total Bilirubin 06/06/2022 0.2 (L)  0.3 - 1.2 mg/dL Final    AST 21/30/8657 28  <=34 U/L Final    ALT 06/06/2022 29  10 - 49 U/L Final    Alkaline Phosphatase 06/06/2022 172 (H)  46 - 116 U/L Final    Magnesium 06/06/2022 1.8  1.6 - 2.6 mg/dL Final    WBC 84/69/6295 7.5  3.6 - 11.2 10*9/L Final    RBC 06/06/2022 3.63 (L)  3.95 - 5.13 10*12/L Final    HGB 06/06/2022 10.9 (L)  11.3 - 14.9 g/dL Final    HCT 28/41/3244 33.1 (L)  34.0 - 44.0 % Final    MCV 06/06/2022 91.2  77.6 - 95.7 fL Final    MCH 06/06/2022 29.9  25.9 - 32.4 pg Final    MCHC 06/06/2022 32.8  32.0 - 36.0 g/dL Final    RDW 08/27/7251 13.5  12.2 - 15.2 % Final    MPV 06/06/2022 7.9  6.8 - 10.7 fL Final    Platelet 06/06/2022 114 (L)  150 - 450 10*9/L Final    Neutrophils % 06/06/2022 72.3  % Final    Lymphocytes % 06/06/2022 18.6  % Final    Monocytes % 06/06/2022 7.4  % Final    Eosinophils % 06/06/2022 1.3  % Final    Basophils % 06/06/2022 0.4  % Final    Absolute Neutrophils 06/06/2022 5.4  1.8 - 7.8 10*9/L Final    Absolute Lymphocytes 06/06/2022 1.4  1.1 - 3.6 10*9/L Final    Absolute Monocytes 06/06/2022 0.6  0.3 - 0.8 10*9/L Final    Absolute Eosinophils 06/06/2022 0.1  0.0 - 0.5 10*9/L Final    Absolute Basophils 06/06/2022 0.0  0.0 - 0.1 10*9/L Final

## 2022-06-09 MED ORDER — OXYCODONE 5 MG TABLET
ORAL_TABLET | Freq: Four times a day (QID) | ORAL | 0 refills | 23 days | Status: CP | PRN
Start: 2022-06-09 — End: 2022-07-09

## 2022-06-09 NOTE — Unmapped (Signed)
Hi,     Oliviana contacted the Communication Center requesting to speak with the care team of Larena L Mathews to discuss:    Requests a call back from the care team.    Please contact Sofia at [phone].    Thank you,   Yehuda Mao  [facility] Communication Center   [phone]

## 2022-06-09 NOTE — Unmapped (Addendum)
[facility] Navigation: Follow-Up    Completed with: Hardin Negus (husband)     Oncology Patient Navigator (OPN) provided follow-up call to review previously discussed resources/identified barriers and evaluate current needs.     Spoke to Mr. Norma Green today, Norma Green is eating and drinking well.    verifications have been returned at [facility]/financial application approved.     Patient is already deemed disabled, her only income is from disability. Gas cards already provided.     Due to low income, in basket sent to LCSW pool for evaluation. Norma Green is kindly assisting patient with CPAF and other resources.      Mr. Norma Green endorsed understanding of upcoming appointment times/location as well as how to contact medical team if needed.     He denied any additional needs at this time and verbalized understanding of how to contact OPN if needs arise in the future.     No additional follow-up scheduled.    Please message our program through In Basket [facility] Oncology Navigation) as appropriate/needed.

## 2022-06-11 NOTE — Unmapped (Signed)
Hi,     Norma Green contacted the [employer] regarding the following:    - States that he would like to know if it is ok for his wife to take a flu shot.    Please contact Norma Green at [phone].    Thanks in advance,    Iona Hansen  [facility] Cancer Communication Center   [phone]

## 2022-06-11 NOTE — Unmapped (Signed)
Returned call to Mr. Norma Green to relay message from Eula Fried regarding if it was ok to get the flu shot:     Yes, absolutely.  We spoke about this at our last visit and she didn't want to get it the same day as chemo.  She can get it whenever she wants.      Cherene Altes, nurse navigator covering for Ted Mcalpine also advised:     Typically we like to time vaccinations as they are approaching the next cycle of chemotherapy to get maximum benefit (counts are highest).  Obviously, it is up to each individual patient as to what they are comfortable with doing.     Norma Green was appreciative of the call and understand that she is able to get the flu shot.      No more questions at this time

## 2022-06-12 NOTE — Unmapped (Signed)
[facility] Refill Coordination Note    Specialty Medication(s) to be Shipped:   Hematology/Oncology: [name]    Other medication(s) to be shipped: No additional medications requested for fill at this time     Norma Green, DOB: [birth year]  Phone: [phone] (home)       All above HIPAA information was verified with patient.     Was a Nurse, learning disability used for this call? No    Completed refill call assessment today to schedule patient's medication shipment from the [facility] [phone]).  All relevant notes have been reviewed.     Specialty medication(s) and dose(s) confirmed: Regimen is correct and unchanged.   Changes to medications: Norma Green reports no changes at this time.  Changes to insurance: No  New side effects reported not previously addressed with a pharmacist or physician: None reported  Questions for the pharmacist: No    Confirmed patient received a Conservation officer, historic buildings and a Surveyor, mining with first shipment. The patient will receive a drug information handout for each medication shipped and additional FDA Medication Guides as required.       DISEASE/MEDICATION-SPECIFIC INFORMATION        For patients on injectable medications: Patient currently has 0 doses left.  Next injection is scheduled for 06/27/22.    SPECIALTY MEDICATION ADHERENCE     Medication Adherence    Patient reported X missed doses in the last month: 0  Specialty Medication: Udenyca 6mg /0.3mL  Patient is on additional specialty medications: No  Informant: spouse                       Were doses missed due to medication being on hold? No    Udenyca 6mg /0.28mL: 0 days of medicine on hand       REFERRAL TO PHARMACIST     Referral to the pharmacist: Not needed      [facility]     Shipping address confirmed in Epic.     Delivery Scheduled: Yes, Expected medication delivery date: 06/24/22.     Medication will be delivered via Same Day Courier to the prescription address in Epic [place].    Wyatt Mage M Elisabeth Cara   [facility] Specialty Technician

## 2022-06-24 MED FILL — UDENYCA 6 MG/0.6 ML SUBCUTANEOUS SYRINGE: SUBCUTANEOUS | 28 days supply | Qty: 1.2 | Fill #3

## 2022-06-26 NOTE — Unmapped (Signed)
[facility]     PRIMARY CARE PROVIDER:  Jaci Carrel, ANP  [id number] Physician Office [place] [address]    CONSULTING PROVIDERS  [facility] Biliary Team  ____________________________________________________________________    CANCER HISTORY  Diagnosis: Pancreatic Adenocarcinoma, imaging concerning for metastatic disease to liver  1. 12/12/21: Presented with abdominal pain, weight loss found to have CBD obstruction with both pancreatic head and tail mass along with liver lesion. EUS/ERCP with CBD stent placement.  Biopsy of pancreatic tail mass with adenocarcinoma. Concern for metastatic disease with 1 cm liver lesion.  Also, local extension to left adrenal.   2. 5/4 start FOLFIRINOX (Cy 1 FOLFOX)    Molecular:  Somatic: KRAS, TP53, CCND3, TFEB, VEGFA, TMP 6.3 m/MB  Germline: negative   ____________________________________________________________________    ASSESSMENT  1. 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 Cancer associated abdominal pain - improving  3. Opioid Induced Constipation   4. Chemo neuropathy, grade 1, ctm  5. Chemotherapy induced neutropenia - managed with GCSF  6. Chemotherapy induced thrombocytopenia - improved    RECOMMENDATIONS  1. mFOLIRINOX-->de-escalate to FOLFIRI + GCSF support   2. Oxycodone 5mg  every 4 hours prn   3. Continue Miralax and Senna - increase senna to bid prn  4. Scheduled dex x 3 days post chemo, zofran, compazine prn  5. RTC 2 weeks for Ct scan, labs, visit and treatment. [Pump disconnect at [place]]    DISCUSSION  [name] is doing well, feels treatment side effects are manageable.  Continue treatment as planned.      Decision-making today was high complexity on the basis of discussion of cancer, and weighing use of high risk chemotherapy, toxicity and complications/comorbidity of metastatic pancreatic cancer.  ______________________________________________________________________  HISTORY Norma Green is seen today at the [facility] for ongoing management regarding pancreatic cancer.     Norma Green comes in today doing fairly well.  Neuropathy to fingers and toes, comes and goes.  No pain, no difficulty with ADLs.  Appetite has been great.  Mild nausea, manages well with nausea meds.  No significant abd pain.  Bowels have been fairly regular, hasn't had to take bowel regimen.      MEDICAL HISTORY  1. HTN  2. Anxiety    Allergies   Allergen Reactions    Metronidazole Nausea And Vomiting     Medications reviewed in the EMR    SOCIAL HISTORY  Lives with family including husband. Smoker. No alcohol or drug use.  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 New grandbaby on the way 03/2022    PHYSICAL EXAM  Vitals:    06/27/22 0754   BP: 152/68   Pulse: 88   Temp: 36.6 ??C (97.9 ??F)   SpO2: 100%       GENERAL: well developed, well nourished, in no distress  PSYCH: full and appropriate range of affect with good insight and judgement  HEENT: NCAT, pupils equal, sclerae anicteric  EXT: warm and well perfused, no edema  SKIN: No rashes    OBJECTIVE DATA  LABS  Labs reviewed in emr, ok for treatment  Plts nl     RADIOLOGY RESULTS   None today

## 2022-06-27 ENCOUNTER — Ambulatory Visit: Admit: 2022-06-27 | Discharge: 2022-06-28 | Payer: PRIVATE HEALTH INSURANCE

## 2022-06-27 ENCOUNTER — Other Ambulatory Visit: Admit: 2022-06-27 | Discharge: 2022-06-28 | Payer: PRIVATE HEALTH INSURANCE

## 2022-06-27 DIAGNOSIS — Z09 Encounter for follow-up examination after completed treatment for conditions other than malignant neoplasm: Principal | ICD-10-CM

## 2022-06-27 DIAGNOSIS — T451X5A Adverse effect of antineoplastic and immunosuppressive drugs, initial encounter: Principal | ICD-10-CM

## 2022-06-27 DIAGNOSIS — C787 Secondary malignant neoplasm of liver and intrahepatic bile duct: Principal | ICD-10-CM

## 2022-06-27 DIAGNOSIS — G620 Drug-induced polyneuropathy: Principal | ICD-10-CM

## 2022-06-27 DIAGNOSIS — C259 Malignant neoplasm of pancreas, unspecified: Principal | ICD-10-CM

## 2022-06-27 LAB — COMPREHENSIVE METABOLIC PANEL
ALBUMIN: 3.4 g/dL (ref 3.4–5.0)
ALKALINE PHOSPHATASE: 159 U/L — ABNORMAL HIGH (ref 46–116)
ALT (SGPT): 22 U/L (ref 10–49)
ANION GAP: 10 mmol/L (ref 5–14)
AST (SGOT): 20 U/L (ref <=34)
BILIRUBIN TOTAL: 0.4 mg/dL (ref 0.3–1.2)
BLOOD UREA NITROGEN: 21 mg/dL (ref 9–23)
BUN / CREAT RATIO: 29
CALCIUM: 9.1 mg/dL (ref 8.7–10.4)
CHLORIDE: 108 mmol/L — ABNORMAL HIGH (ref 98–107)
CO2: 28 mmol/L (ref 20.0–31.0)
CREATININE: 0.72 mg/dL
EGFR CKD-EPI (2021) FEMALE: >90 mL/min/{1.73_m2} (ref >=60)
GLUCOSE RANDOM: 177 mg/dL (ref 70–179)
POTASSIUM: 3.8 mmol/L (ref 3.5–5.1)
PROTEIN TOTAL: 6.7 g/dL (ref 5.7–8.2)
SODIUM: 146 mmol/L — ABNORMAL HIGH (ref 135–145)

## 2022-06-27 LAB — MAGNESIUM: MAGNESIUM: 1.8 mg/dL (ref 1.6–2.6)

## 2022-06-27 LAB — CBC W/ AUTO DIFF
BASOPHILS ABSOLUTE COUNT: 0.1 10*9/L (ref 0.0–0.1)
BASOPHILS RELATIVE PERCENT: 1 %
EOSINOPHILS ABSOLUTE COUNT: 0.1 10*9/L (ref 0.0–0.5)
EOSINOPHILS RELATIVE PERCENT: 1.7 %
HEMATOCRIT: 34.6 % (ref 34.0–44.0)
HEMOGLOBIN: 11.4 g/dL (ref 11.3–14.9)
LYMPHOCYTES ABSOLUTE COUNT: 1.2 10*9/L (ref 1.1–3.6)
LYMPHOCYTES RELATIVE PERCENT: 23.3 %
MEAN CORPUSCULAR HEMOGLOBIN CONC: 32.9 g/dL (ref 32.0–36.0)
MEAN CORPUSCULAR HEMOGLOBIN: 29.2 pg (ref 25.9–32.4)
MEAN CORPUSCULAR VOLUME: 88.6 fL (ref 77.6–95.7)
MEAN PLATELET VOLUME: 7.4 fL (ref 6.8–10.7)
MONOCYTES ABSOLUTE COUNT: 0.7 10*9/L (ref 0.3–0.8)
MONOCYTES RELATIVE PERCENT: 12.9 %
NEUTROPHILS ABSOLUTE COUNT: 3.3 10*9/L (ref 1.8–7.8)
NEUTROPHILS RELATIVE PERCENT: 61.1 %
PLATELET COUNT: 172 10*9/L (ref 150–450)
RED BLOOD CELL COUNT: 3.9 10*12/L — ABNORMAL LOW (ref 3.95–5.13)
RED CELL DISTRIBUTION WIDTH: 13.1 % (ref 12.2–15.2)
WBC ADJUSTED: 5.3 10*9/L (ref 3.6–11.2)

## 2022-06-27 LAB — CANCER ANTIGEN 19-9: CA 19-9: 34.45 U/mL (ref 0–35)

## 2022-06-27 MED ADMIN — atropine injection: .4 mg | INTRAVENOUS | @ 14:00:00

## 2022-06-27 MED ADMIN — prochlorperazine (COMPAZINE) tablet 10 mg: 10 mg | ORAL | @ 16:00:00

## 2022-06-27 MED ADMIN — irinotecan (CAMPTOSAR) 249 mg in dextrose 5 % 500 mL IVPB: 150 mg/m2 | INTRAVENOUS | @ 14:00:00 | Stop: 2022-06-27

## 2022-06-27 MED ADMIN — dextrose 5 % infusion: 100 mL/h | INTRAVENOUS | @ 14:00:00

## 2022-06-27 MED ADMIN — dexAMETHasone (DECADRON) tablet 20 mg: 20 mg | ORAL | @ 13:00:00 | Stop: 2022-06-27

## 2022-06-27 MED ADMIN — leuCOVorin 664 mg in dextrose 5 % 50 mL IVPB: 400 mg/m2 | INTRAVENOUS | @ 14:00:00 | Stop: 2022-06-27

## 2022-06-27 MED ADMIN — fluorouracil (ADRUCIL) 4,000 mg in sodium chloride (NS) 0.9 % 46-hr infusion CADD: 2400 mg/m2 | INTRAVENOUS | @ 16:00:00

## 2022-06-27 MED ADMIN — ondansetron (ZOFRAN) tablet 24 mg: 24 mg | ORAL | @ 13:00:00 | Stop: 2022-06-27

## 2022-06-27 NOTE — Unmapped (Signed)
7564: pt arrived to unit, c./o neuropathy to bilat feet/hands which has been present and not worsening. Deny n/v, diarrhea, pain. Rcw mediport w chg dsg cdi and family present. Labs within parameters for tx  1200: pt tolerated infusion with c/o little nauseated prior to apply pump. Given compazine due to not having home po antiemetic and long drive home. Rcw mediport dsg cdi and no ss of infection w chg dsg. Blood return checked post chemo and pr pump application. 2fu pump connected, clamps open, and pump infusing with green light flashing. Home pump disconnect and port flush bag given to son. AVS given. Dc home w family

## 2022-06-27 NOTE — Unmapped (Signed)
Port accessed.  CHG dressing applied.  Labs drawn & sent for analysis.  Port flushed & saline-locked.  To next appt.  Care provided by PR.

## 2022-06-29 ENCOUNTER — Ambulatory Visit: Admit: 2022-06-29 | Discharge: 2022-06-30 | Payer: PRIVATE HEALTH INSURANCE

## 2022-06-29 MED ADMIN — heparin, porcine (PF) 100 unit/mL injection 500 Units: 500 [IU] | INTRAVENOUS | @ 15:00:00 | Stop: 2022-06-30

## 2022-06-29 NOTE — Unmapped (Signed)
RED ZONE Means: RED ZONE: Take action now!     You need to be seen right away  Symptoms are at a severe level of discomfort    Call 911 or go to your nearest  Hospital for help     - Bleeding that will not stop    - Hard to breathe    - New seizure - Chest pain  - Fall or passing out  -Thoughts of hurting    yourself or others      Call 911 if you are going into the RED ZONE                  YELLOW ZONE Means:     Please call with any new or worsening symptom(s), even if not on this list.  Call [phone]  After hours, weekends, and holidays - you will reach a long recording with specific instructions, If not in an emergency such as above, please listen closely all the way to the end and choose the option that relates to your need.   You can be seen by a provider the same day through our Same Day Acute Care for Patients with Cancer program.      YELLOW ZONE: Take action today     Symptoms are new or worsening  You are not within your goal range for:    - Pain    - Shortness of breath    - Bleeding (nose, urine, stool, wound)    - Feeling sick to your stomach and throwing up    - Mouth sores/pain in your mouth or throat    - Hard stool or very loose stools (increase in       ostomy output)    - No urine for 12 hours    - Feeding tube or other catheter/tube issue    - Redness or pain at previous IV or port/catheter site    - Depressed or anxiety   - Swelling (leg, arm, abdomen,     face, neck)  - Skin rash or skin changes  - Wound issues (redness, drainage,    re-opened)  - Confusion  - Vision changes  - Fever >100.4 F or chills  - Worsening cough with mucus that is    green, yellow, or bloody  - Pain or burning when going to the    bathroom  - Home Infusion Pump Issue- call    [phone]         Call your healthcare provider if you are going into the YELLOW ZONE     GREEN ZONE Means:  Your symptoms are under controls  Continue to take your medicine as ordered  Keep all visits to the provider GREEN ZONE: You are in control  No increase or worsening symptoms  Able to take your medicine  Able to drink and eat    - DO NOT use MyChart messages to report red or yellow symptoms. Allow up to 3    business days for a reply.  -MyChart is for non-urgent medication refills, scheduling requests, or other general questions.         [id number] Rev. 02/21/2022  Approved by Oncology Patient Education Committee

## 2022-06-29 NOTE — Unmapped (Signed)
Pt here for chemo pump disconnect.  Right chest PAC flushed per protocol with good blood return.  HN removed intact.  Site unremarkable.  Pt was stable at discharge via wheelchair by son.

## 2022-07-09 DIAGNOSIS — C259 Malignant neoplasm of pancreas, unspecified: Principal | ICD-10-CM

## 2022-07-09 DIAGNOSIS — C787 Secondary malignant neoplasm of liver and intrahepatic bile duct: Principal | ICD-10-CM

## 2022-07-10 ENCOUNTER — Ambulatory Visit: Admit: 2022-07-10 | Discharge: 2022-07-11 | Payer: PRIVATE HEALTH INSURANCE

## 2022-07-10 MED ADMIN — iohexoL (OMNIPAQUE) 350 mg iodine/mL solution 100 mL: 100 mL | INTRAVENOUS | @ 17:00:00 | Stop: 2022-07-10

## 2022-07-10 NOTE — Unmapped (Signed)
[facility]     CONSULTING PROVIDERS  [place] [facility] Team  ____________________________________________________________________    CANCER HISTORY  Diagnosis: Pancreatic Adenocarcinoma, imaging concerning for metastatic disease to liver  1. 12/12/21: Presented with abdominal pain, weight loss found to have CBD obstruction with both pancreatic head and tail mass along with liver lesion. EUS/ERCP with CBD stent placement.  Biopsy of pancreatic tail mass with adenocarcinoma. Concern for metastatic disease with 1 cm liver lesion.  Also, local extension to left adrenal.   2. 5/4 start FOLFIRINOX (Cy 1 FOLFOX)    Molecular:  Somatic: KRAS, TP53, CCND3, TFEB, VEGFA, TMP 6.3 m/MB  Germline: negative   ____________________________________________________________________    ASSESSMENT  1. 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 Ground glass opacities on CT, asymptomatic and likely r/t to recent virus  3. Cancer associated abdominal pain -controlled  3. Opioid Induced Constipation -controlled  4. Chemo neuropathy, grade 1, ctm  5. Chemotherapy induced neutropenia - managed with GCSF  6. Chemotherapy induced thrombocytopenia - improved    RECOMMENDATIONS  1. mFOLIRINOX-->  FOLFIRI + GCSF support   2. Oxycodone 5mg  every 4 hours prn   3. Continue Miralax and Senna    4. Scheduled dex x 3 days post chemo, zofran, compazine prn  5. RTC 2 weeks for treatment; 4 weeks visit and treatment. [Pump disconnect at [place]]    DISCUSSION   Reviewed scans showing nice disease control, good blood work. Discussed that not sure how long chemo will work but that as her symptoms remain tolerable want to continue to keep pressure on the cancer. She agrees with continued current dose and schedule. Current supportive care.     Reviewed the GGO seen on her CT-- this could be drug effect as multiple chemos have been associated with pneumonitis but incidence with FOLFIRI very low. Think this is more likey r/t to the virus she had last week. She will monitor her respiratory symptoms closely and if any cough or SOB develop let us know    Decision-making today was high complexity on the basis of discussion of cancer, and weighing use of high risk chemotherapy, toxicity and complications/comorbidity of metastatic pancreatic cancer.  ______________________________________________________________________  HISTORY     Norma Green is seen today at the [facility] for ongoing management regarding pancreatic cancer.      Energy ok-- fatigued for a few days after chemo but then she can be up and around doing normal things  Neuropathy is stable and persistent. Hard to do things like necklaces but otherwise can do normal things  Appetite pretty good once gets past first few chemo days. Nausea well controlled for the most part  Pain  well controlled with regular oxy    Notes that last week she, her daughter and her grandkids had a virus. Felt sore throat, myalgias. No fever. No SOB, minimal cough.     MEDICAL HISTORY  1. HTN  2. Anxiety    Allergies   Allergen Reactions    Metronidazole Nausea And Vomiting     Medications reviewed in the EMR    SOCIAL HISTORY  Lives with family including husband. Smoker. No alcohol or drug use.  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 New grandbaby on the way 03/2022    PHYSICAL EXAM  There were no vitals filed for this visit.  GENERAL: well developed, well nourished, in no distress  PSYCH: full and appropriate range  of affect with good insight and judgement  HEENT: NCAT, pupils equal, sclerae anicteric  EXT: warm and well perfused, no edema  SKIN: No rashes    OBJECTIVE DATA  LABS  Labs reviewed in emr, ok for treatment    RADIOLOGY RESULTS   Scans this am show stable disease-- panc mass and liver mets the same  Subtle new GGO in upper lung zones.

## 2022-07-11 ENCOUNTER — Other Ambulatory Visit: Admit: 2022-07-11 | Discharge: 2022-07-12 | Payer: PRIVATE HEALTH INSURANCE

## 2022-07-11 ENCOUNTER — Ambulatory Visit
Admit: 2022-07-11 | Discharge: 2022-07-12 | Payer: PRIVATE HEALTH INSURANCE | Attending: Hematology & Oncology | Primary: Hematology & Oncology

## 2022-07-11 ENCOUNTER — Ambulatory Visit: Admit: 2022-07-11 | Discharge: 2022-07-12 | Payer: PRIVATE HEALTH INSURANCE

## 2022-07-11 DIAGNOSIS — C259 Malignant neoplasm of pancreas, unspecified: Principal | ICD-10-CM

## 2022-07-11 LAB — CBC W/ AUTO DIFF
BASOPHILS ABSOLUTE COUNT: 0 10*9/L (ref 0.0–0.1)
BASOPHILS RELATIVE PERCENT: 0.4 %
EOSINOPHILS ABSOLUTE COUNT: 0.1 10*9/L (ref 0.0–0.5)
EOSINOPHILS RELATIVE PERCENT: 1.2 %
HEMATOCRIT: 35.3 % (ref 34.0–44.0)
HEMOGLOBIN: 11.5 g/dL (ref 11.3–14.9)
LYMPHOCYTES ABSOLUTE COUNT: 1.3 10*9/L (ref 1.1–3.6)
LYMPHOCYTES RELATIVE PERCENT: 16.7 %
MEAN CORPUSCULAR HEMOGLOBIN CONC: 32.7 g/dL (ref 32.0–36.0)
MEAN CORPUSCULAR HEMOGLOBIN: 28.6 pg (ref 25.9–32.4)
MEAN CORPUSCULAR VOLUME: 87.6 fL (ref 77.6–95.7)
MEAN PLATELET VOLUME: 7.6 fL (ref 6.8–10.7)
MONOCYTES ABSOLUTE COUNT: 0.6 10*9/L (ref 0.3–0.8)
MONOCYTES RELATIVE PERCENT: 7.6 %
NEUTROPHILS ABSOLUTE COUNT: 5.9 10*9/L (ref 1.8–7.8)
NEUTROPHILS RELATIVE PERCENT: 74.1 %
PLATELET COUNT: 138 10*9/L — ABNORMAL LOW (ref 150–450)
RED BLOOD CELL COUNT: 4.03 10*12/L (ref 3.95–5.13)
RED CELL DISTRIBUTION WIDTH: 13.8 % (ref 12.2–15.2)
WBC ADJUSTED: 8 10*9/L (ref 3.6–11.2)

## 2022-07-11 LAB — COMPREHENSIVE METABOLIC PANEL
ALBUMIN: 3.3 g/dL — ABNORMAL LOW (ref 3.4–5.0)
ALKALINE PHOSPHATASE: 156 U/L — ABNORMAL HIGH (ref 46–116)
ALT (SGPT): 20 U/L (ref 10–49)
ANION GAP: 8 mmol/L (ref 5–14)
AST (SGOT): 18 U/L (ref <=34)
BILIRUBIN TOTAL: 0.4 mg/dL (ref 0.3–1.2)
BLOOD UREA NITROGEN: 14 mg/dL (ref 9–23)
BUN / CREAT RATIO: 19
CALCIUM: 9.3 mg/dL (ref 8.7–10.4)
CHLORIDE: 105 mmol/L (ref 98–107)
CO2: 29 mmol/L (ref 20.0–31.0)
CREATININE: 0.73 mg/dL
EGFR CKD-EPI (2021) FEMALE: >90 mL/min/{1.73_m2} (ref >=60)
GLUCOSE RANDOM: 156 mg/dL (ref 70–179)
POTASSIUM: 3.7 mmol/L (ref 3.4–4.8)
PROTEIN TOTAL: 7.3 g/dL (ref 5.7–8.2)
SODIUM: 142 mmol/L (ref 135–145)

## 2022-07-11 LAB — MAGNESIUM: MAGNESIUM: 2 mg/dL (ref 1.6–2.6)

## 2022-07-11 MED ORDER — AMLODIPINE 10 MG TABLET
ORAL_TABLET | Freq: Every evening | ORAL | 11 refills | 30 days | Status: CP
Start: 2022-07-11 — End: ?

## 2022-07-11 MED ORDER — PROCHLORPERAZINE MALEATE 10 MG TABLET
ORAL_TABLET | Freq: Four times a day (QID) | ORAL | 11 refills | 8 days | Status: CP | PRN
Start: 2022-07-11 — End: ?

## 2022-07-11 MED ORDER — OXYCODONE 5 MG TABLET
ORAL_TABLET | Freq: Four times a day (QID) | ORAL | 0 refills | 23 days | Status: CP | PRN
Start: 2022-07-11 — End: 2022-08-10

## 2022-07-11 MED ADMIN — irinotecan (CAMPTOSAR) 249 mg in dextrose 5 % 500 mL IVPB: 150 mg/m2 | INTRAVENOUS | @ 19:00:00 | Stop: 2022-07-11

## 2022-07-11 MED ADMIN — ondansetron (ZOFRAN) tablet 24 mg: 24 mg | ORAL | @ 18:00:00 | Stop: 2022-07-11

## 2022-07-11 MED ADMIN — fluorouracil (ADRUCIL) 4,000 mg in sodium chloride (NS) 0.9 % 46-hr infusion CADD: 2400 mg/m2 | INTRAVENOUS | @ 21:00:00

## 2022-07-11 MED ADMIN — dextrose 5 % infusion: 100 mL/h | INTRAVENOUS | @ 18:00:00

## 2022-07-11 MED ADMIN — leuCOVorin 664 mg in dextrose 5 % 50 mL IVPB: 400 mg/m2 | INTRAVENOUS | @ 19:00:00 | Stop: 2022-07-11

## 2022-07-11 MED ADMIN — atropine injection: .4 mg | INTRAVENOUS | @ 19:00:00

## 2022-07-11 MED ADMIN — dexAMETHasone (DECADRON) tablet 20 mg: 20 mg | ORAL | @ 18:00:00 | Stop: 2022-07-11

## 2022-07-11 NOTE — Unmapped (Signed)
Lab on 07/11/2022   Component Date Value Ref Range Status    Sodium 07/11/2022 142  135 - 145 mmol/L Final    Potassium 07/11/2022 3.7  3.4 - 4.8 mmol/L Final    Chloride 07/11/2022 105  98 - 107 mmol/L Final    CO2 07/11/2022 29.0  20.0 - 31.0 mmol/L Final    Anion Gap 07/11/2022 8  5 - 14 mmol/L Final    BUN 07/11/2022 14  9 - 23 mg/dL Final    Creatinine 98/06/9146 0.73  0.55 - 1.02 mg/dL Final    BUN/Creatinine Ratio 07/11/2022 19   Final    eGFR CKD-EPI (2021) Female 07/11/2022 >90  >=60 mL/min/1.86m2 Final    eGFR calculated with CKD-EPI 2021 equation in accordance with [employer] and [employer] of Nephrology Task Force recommendations.    Glucose 07/11/2022 156  70 - 179 mg/dL Final    Calcium 82/95/6213 9.3  8.7 - 10.4 mg/dL Final    Albumin 08/65/7846 3.3 (L)  3.4 - 5.0 g/dL Final    Total Protein 07/11/2022 7.3  5.7 - 8.2 g/dL Final    Total Bilirubin 07/11/2022 0.4  0.3 - 1.2 mg/dL Final    AST 96/29/5284 18  <=34 U/L Final    ALT 07/11/2022 20  10 - 49 U/L Final    Alkaline Phosphatase 07/11/2022 156 (H)  46 - 116 U/L Final    Magnesium 07/11/2022 2.0  1.6 - 2.6 mg/dL Final    WBC 13/24/4010 8.0  3.6 - 11.2 10*9/L Final    RBC 07/11/2022 4.03  3.95 - 5.13 10*12/L Final    HGB 07/11/2022 11.5  11.3 - 14.9 g/dL Final    HCT 27/25/3664 35.3  34.0 - 44.0 % Final    MCV 07/11/2022 87.6  77.6 - 95.7 fL Final    MCH 07/11/2022 28.6  25.9 - 32.4 pg Final    MCHC 07/11/2022 32.7  32.0 - 36.0 g/dL Final    RDW 40/34/7425 13.8  12.2 - 15.2 % Final    MPV 07/11/2022 7.6  6.8 - 10.7 fL Final    Platelet 07/11/2022 138 (L)  150 - 450 10*9/L Final    Neutrophils % 07/11/2022 74.1  % Final    Lymphocytes % 07/11/2022 16.7  % Final    Monocytes % 07/11/2022 7.6  % Final    Eosinophils % 07/11/2022 1.2  % Final    Basophils % 07/11/2022 0.4  % Final    Absolute Neutrophils 07/11/2022 5.9  1.8 - 7.8 10*9/L Final    Absolute Lymphocytes 07/11/2022 1.3  1.1 - 3.6 10*9/L Final    Absolute Monocytes 07/11/2022 0.6  0.3 - 0.8 10*9/L Final    Absolute Eosinophils 07/11/2022 0.1  0.0 - 0.5 10*9/L Final    Absolute Basophils 07/11/2022 0.0  0.0 - 0.1 10*9/L Final

## 2022-07-12 NOTE — Unmapped (Signed)
Patient arrived to chair 24. Labs within parameter for tx today. Port was flushed, + BR. Premeds given. Patient completed and tolerated infusion. Line checked for blood return and flushed. CADD pump connected. All clamps opened and pump reads running. AVS provided and patient discharged to home.

## 2022-07-13 ENCOUNTER — Ambulatory Visit: Admit: 2022-07-13 | Discharge: 2022-07-14 | Payer: PRIVATE HEALTH INSURANCE

## 2022-07-13 MED ADMIN — heparin, porcine (PF) 100 unit/mL injection 500 Units: 500 [IU] | INTRAVENOUS | @ 19:00:00 | Stop: 2022-07-14

## 2022-07-13 NOTE — Unmapped (Signed)
Pt. Arrived to oncology infusion for pump disconnect. Pump was disconnected. Port was flushed, +BR. Port was de-accessed and patient discharged with no additional needs, AVS declined.

## 2022-07-15 NOTE — Unmapped (Signed)
[facility] Clinical Assessment & Refill Coordination Note    Norma Green, DOB: [birth year]  Phone: [phone] (home) [phone] (work)    All above HIPAA information was verified with patient.     Was a Nurse, learning disability used for this call? No    Specialty Medication(s):   Hematology/Oncology: [name]     Current Outpatient Medications   Medication Sig Dispense Refill    amlodipine (NORVASC) 10 MG tablet Take 1 tablet (10 mg total) by mouth nightly. 30 tablet 11    aspirin (ECOTRIN) 81 MG tablet Take 1 tablet (81 mg total) by mouth daily.      dexAMETHasone (DECADRON) 4 MG tablet Take 2 tablets (8 mg total) by mouth daily. Take only on days 2, 3, and 4 of each 14-day chemotherapy cycle. 12 tablet 5    heparin, porcine, PF, 100 unit/mL Syrg Infuse 5 mL into a venous catheter every fourteen (14) days. For use while patient is on fluorouracil (5-FU) home infusion.   Flush IV catheter with heparin 5 mL as directed after final saline flush per SASH method and as needed for line maintenance. 4 each PRN    hydrochlorothiazide (HYDRODIURIL) 25 MG tablet TAKE ONE TABLET BY MOUTH ONCE DAILY 30 tablet 0    levoFLOXacin (LEVAQUIN) 750 MG tablet Take 1 tablet (750 mg total) by mouth daily. 3 tablet 0    lidocaine-prilocaine (EMLA) 2.5-2.5 % cream Apply 30 minutes prior to port access. 5 g 3    lisinopril (PRINIVIL,ZESTRIL) 40 MG tablet Take 1 tablet (40 mg total) by mouth daily. 30 tablet 11    loperamide (IMODIUM) 2 mg capsule If having diarrhea, take 2 capsules (4 mg) after the first loose stool, then 1 capsule every 2 hours until diarrhea free for 12 hours. 60 capsule 11    metoprolol succinate (TOPROL XL) 100 MG 24 hr tablet Take 1 tablet (100 mg total) by mouth daily. 30 tablet 11    nicotine (NICODERM CQ) 7 mg/24 hr patch Place 1 patch on the skin daily. 28 patch 1    ondansetron (ZOFRAN) 8 MG tablet Take 1 tablet (8 mg total) by mouth every eight (8) hours as needed for nausea. 30 tablet 2    oxyCODONE (ROXICODONE) 5 MG immediate release tablet Take 1 tablet (5 mg total) by mouth every six (6) hours as needed. 90 tablet 0    pegfilgrastim-cbqv (UDENYCA) 6 mg/0.6 mL injection Inject the contents of 1 syringe (6 mg) under the skin once on day 4 each chemotherapy cycle (24 hours after the completion of chemotherapy). 1.2 mL 5    pegfilgrastim-cbqv 6 mg/0.6 mL AtIn Inject 1 Syringe under the skin once on day 4 each chemotherapy cycle (24 hours after the completion of chemotherapy). 1.2 mL 3    polyethylene glycol (GLYCOLAX) 17 gram/dose powder Take 17 g by mouth daily. 255 g 11    prochlorperazine (COMPAZINE) 10 MG tablet Take 1 tablet (10 mg total) by mouth every six (6) hours as needed (nausea). 30 tablet 11    sertraline (ZOLOFT) 50 MG tablet Take 1 tablet (50 mg total) by mouth daily. 30 tablet 11    sodium chloride (NS) 0.9 % injection Infuse 10 mL into a venous catheter every fourteen (14) days. For use while patient is on fluorouracil (5-FU) home infusion.   Flush IV catheter with normal saline 10 mL prior to and after infusion followed by heparin flush per SASH method and as needed for line maintenance.  6 each PRN     No current facility-administered medications for this visit.        Changes to medications: Norma Green reports no changes at this time.    Allergies   Allergen Reactions    Metronidazole Nausea And Vomiting       Changes to allergies: No    SPECIALTY MEDICATION ADHERENCE     Udenyca 6 mg: 0 days of medicine on hand     Medication Adherence    Patient reported X missed doses in the last month: 0  Specialty Medication: Udenyca 6 mg  Patient is on additional specialty medications: No  Informant: patient                  Confirmed plan for next specialty medication refill: delivery by pharmacy  Refills needed for supportive medications: not needed          Specialty medication(s) dose(s) confirmed: Regimen is correct and unchanged.     Are there any concerns with adherence? No    Adherence counseling provided? Not needed    CLINICAL MANAGEMENT AND INTERVENTION      Clinical Benefit Assessment:    Do you feel the medicine is effective or helping your condition? Yes    Clinical Benefit counseling provided? Not needed    Adverse Effects Assessment:    Are you experiencing any side effects? No    Are you experiencing difficulty administering your medicine? No    Quality of Life Assessment:    Quality of Life    Rheumatology  Oncology  1. What impact has your specialty medication had on the reduction of your daily pain or discomfort level?: None  2. On a scale of 1-10, how would you rate your ability to manage side effects associated with your specialty medication? (1=no issues, 10 = unable to take medication due to side effects): 1  Dermatology  Cystic Fibrosis          How many days over the past month did your Pancreatic adenocarcinoma  keep you from your normal activities? For example, brushing your teeth or getting up in the morning. 0    Have you discussed this with your provider? Not needed    Acute Infection Status:    Acute infections noted within Epic:  No active infections  Patient reported infection: None    Therapy Appropriateness:    Is therapy appropriate and patient progressing towards therapeutic goals? Yes, therapy is appropriate and should be continued    DISEASE/MEDICATION-SPECIFIC INFORMATION      For patients on injectable medications: Patient currently has 0 doses left.  Next injection is scheduled for 07/28/22.    Oncology: Is the patient receiving adequate infection prevention treatment? Not applicable  Does the patient have adequate nutritional support? Not applicable    PATIENT SPECIFIC NEEDS     Does the patient have any physical, cognitive, or cultural barriers? No    Is the patient high risk? No    Did the patient require a clinical intervention? No    Does the patient require physician intervention or other additional services (i.e., nutrition, smoking cessation, social work)? No    SOCIAL DETERMINANTS OF HEALTH     At the [facility], we have learned that life circumstances - like trouble affording food, housing, utilities, or transportation can affect the health of many of our patients.   That is why we wanted to ask: are you currently experiencing any life circumstances that are negatively impacting  your health and/or quality of life? Patient declined to answer    Social Determinants of Health     Financial Resource Strain: High Risk (05/23/2022)    Overall Financial Resource Strain (CARDIA)     Difficulty of Paying Living Expenses: Hard   Internet Connectivity: No Internet connectivity concern identified (12/17/2021)    Internet Connectivity     Do you have access to internet services: Yes     How do you connect to the internet: Personal Device at home     Is your internet connection strong enough for you to watch video on your device without major problems?: Yes     Do you have enough data to get through the month?: Yes     Does at least one of the devices have a camera that you can use for video chat?: Yes   Food Insecurity: Food Insecurity Present (05/23/2022)    Hunger Vital Sign     Worried About Running Out of Food in the Last Year: Sometimes true     Ran Out of Food in the Last Year: Sometimes true   Tobacco Use: High Risk (04/16/2022)    Patient History     Smoking Tobacco Use: Every Day     Smokeless Tobacco Use: Unknown     Passive Exposure: Not on file   Housing/Utilities: Low Risk  (05/23/2022)    Housing/Utilities     Within the past 12 months, have you ever stayed: outside, in a car, in a tent, in an overnight shelter, or temporarily in someone else's home (i.e. couch-surfing)?: No     Are you worried about losing your housing?: No     Within the past 12 months, have you been unable to get utilities (heat, electricity) when it was really needed?: No   Alcohol Use: Not on file   Transportation Needs: Unmet Transportation Needs (05/23/2022) PRAPARE - Transportation     Lack of Transportation (Medical): No     Lack of Transportation (Non-Medical): Yes   Substance Use: Not on file   Health Literacy: Low Risk  (12/17/2021)    Health Literacy     : Never   Physical Activity: Not on file   Interpersonal Safety: Not on file   Stress: Not on file   Intimate Partner Violence: Not on file   Depression: Not on file   Social Connections: Not on file     Would you be willing to receive help with any of the needs that you have identified today? Not applicable     SHIPPING     Specialty Medication(s) to be Shipped:   Hematology/Oncology: [name]    Other medication(s) to be shipped: No additional medications requested for fill at this time     Changes to insurance: No    Delivery Scheduled: Yes, Expected medication delivery date: 07/24/22.     Medication will be delivered via Same Day Courier to the confirmed prescription address in [facility].    The patient will receive a drug information handout for each medication shipped and additional FDA Medication Guides as required.  Verified that patient has previously received a Conservation officer, historic buildings and a Surveyor, mining.    The patient or caregiver noted above participated in the development of this care plan and knows that they can request review of or adjustments to the care plan at any time.      All of the patient's questions and concerns have been addressed.    Catie Chiao A  Jacques Navy, [facility]   [facility] Specialty Pharmacist

## 2022-07-24 MED FILL — UDENYCA 6 MG/0.6 ML SUBCUTANEOUS SYRINGE: SUBCUTANEOUS | 28 days supply | Qty: 1.2 | Fill #4

## 2022-07-25 ENCOUNTER — Ambulatory Visit: Admit: 2022-07-25 | Discharge: 2022-07-26 | Payer: PRIVATE HEALTH INSURANCE

## 2022-07-25 LAB — CBC W/ AUTO DIFF
BASOPHILS ABSOLUTE COUNT: 0 10*9/L (ref 0.0–0.1)
BASOPHILS RELATIVE PERCENT: 0.3 %
EOSINOPHILS ABSOLUTE COUNT: 0.1 10*9/L (ref 0.0–0.5)
EOSINOPHILS RELATIVE PERCENT: 0.8 %
HEMATOCRIT: 33.4 % — ABNORMAL LOW (ref 34.0–44.0)
HEMOGLOBIN: 11.1 g/dL — ABNORMAL LOW (ref 11.3–14.9)
LYMPHOCYTES ABSOLUTE COUNT: 1.5 10*9/L (ref 1.1–3.6)
LYMPHOCYTES RELATIVE PERCENT: 15.9 %
MEAN CORPUSCULAR HEMOGLOBIN CONC: 33.3 g/dL (ref 32.0–36.0)
MEAN CORPUSCULAR HEMOGLOBIN: 29 pg (ref 25.9–32.4)
MEAN CORPUSCULAR VOLUME: 87.2 fL (ref 77.6–95.7)
MEAN PLATELET VOLUME: 7.7 fL (ref 6.8–10.7)
MONOCYTES ABSOLUTE COUNT: 0.8 10*9/L (ref 0.3–0.8)
MONOCYTES RELATIVE PERCENT: 7.9 %
NEUTROPHILS ABSOLUTE COUNT: 7.2 10*9/L (ref 1.8–7.8)
NEUTROPHILS RELATIVE PERCENT: 75.1 %
PLATELET COUNT: 141 10*9/L — ABNORMAL LOW (ref 150–450)
RED BLOOD CELL COUNT: 3.83 10*12/L — ABNORMAL LOW (ref 3.95–5.13)
RED CELL DISTRIBUTION WIDTH: 14.4 % (ref 12.2–15.2)
WBC ADJUSTED: 9.7 10*9/L (ref 3.6–11.2)

## 2022-07-25 LAB — COMPREHENSIVE METABOLIC PANEL
ALBUMIN: 3.3 g/dL — ABNORMAL LOW (ref 3.4–5.0)
ALKALINE PHOSPHATASE: 163 U/L — ABNORMAL HIGH (ref 46–116)
ALT (SGPT): 28 U/L (ref 10–49)
ANION GAP: 6 mmol/L (ref 5–14)
AST (SGOT): 22 U/L (ref <=34)
BILIRUBIN TOTAL: 0.3 mg/dL (ref 0.3–1.2)
BLOOD UREA NITROGEN: 15 mg/dL (ref 9–23)
BUN / CREAT RATIO: 20
CALCIUM: 9.1 mg/dL (ref 8.7–10.4)
CHLORIDE: 109 mmol/L — ABNORMAL HIGH (ref 98–107)
CO2: 29 mmol/L (ref 20.0–31.0)
CREATININE: 0.74 mg/dL
EGFR CKD-EPI (2021) FEMALE: >90 mL/min/{1.73_m2} (ref >=60)
GLUCOSE RANDOM: 121 mg/dL (ref 70–179)
POTASSIUM: 4 mmol/L (ref 3.5–5.1)
PROTEIN TOTAL: 6.5 g/dL (ref 5.7–8.2)
SODIUM: 144 mmol/L (ref 135–145)

## 2022-07-25 LAB — MAGNESIUM: MAGNESIUM: 2 mg/dL (ref 1.6–2.6)

## 2022-07-25 LAB — CANCER ANTIGEN 19-9: CA 19-9: 47.73 U/mL — ABNORMAL HIGH (ref 0–35)

## 2022-07-25 MED ADMIN — prochlorperazine (COMPAZINE) tablet 10 mg: 10 mg | ORAL | @ 14:00:00

## 2022-07-25 MED ADMIN — irinotecan (CAMPTOSAR) 276 mg in dextrose 5 % 500 mL IVPB: 150 mg/m2 | INTRAVENOUS | @ 15:00:00 | Stop: 2022-07-25

## 2022-07-25 MED ADMIN — ondansetron (ZOFRAN) tablet 24 mg: 24 mg | ORAL | @ 14:00:00 | Stop: 2022-07-25

## 2022-07-25 MED ADMIN — dextrose 5 % infusion: 100 mL/h | INTRAVENOUS | @ 14:00:00

## 2022-07-25 MED ADMIN — leuCOVorin 736 mg in dextrose 5 % 50 mL IVPB: 400 mg/m2 | INTRAVENOUS | @ 15:00:00 | Stop: 2022-07-25

## 2022-07-25 MED ADMIN — fluorouracil (ADRUCIL) 4,400 mg in sodium chloride (NS) 0.9 % 46-hr infusion CADD: 2400 mg/m2 | INTRAVENOUS | @ 16:00:00

## 2022-07-25 MED ADMIN — dexAMETHasone (DECADRON) tablet 20 mg: 20 mg | ORAL | @ 14:00:00 | Stop: 2022-07-25

## 2022-07-25 MED ADMIN — atropine injection: .4 mg | INTRAVENOUS | @ 14:00:00

## 2022-07-25 NOTE — Unmapped (Signed)
0750: Pt arrived    0805: Port accessed without difficulty, CHG dressing applied. Labs sent for processing.    1610: treatment begun    1109: treatment completed without complication. Pt connected to home infusion pump, AVS printed and pt aware of next appointment.

## 2022-07-27 ENCOUNTER — Ambulatory Visit: Admit: 2022-07-27 | Discharge: 2022-07-28 | Payer: PRIVATE HEALTH INSURANCE

## 2022-07-27 MED ADMIN — heparin, porcine (PF) 100 unit/mL injection 500 Units: 500 [IU] | INTRAVENOUS | @ 17:00:00 | Stop: 2022-07-28

## 2022-07-27 NOTE — Unmapped (Signed)
Pt in unit for pump disconnect per orders. Pt resting in chair.  Port needle removed per protocol, pt left with family member with home pump and case.

## 2022-08-04 DIAGNOSIS — C259 Malignant neoplasm of pancreas, unspecified: Principal | ICD-10-CM

## 2022-08-04 MED ORDER — LOPERAMIDE 2 MG CAPSULE
ORAL_CAPSULE | 11 refills | 0 days | Status: CP
Start: 2022-08-04 — End: ?

## 2022-08-04 NOTE — Unmapped (Signed)
Hi,     Richard contacted the [employer] regarding the following:    - States that the patient has diarrhea and he would like for her to be prescribed something.    Please contact Richard at [phone].    Thanks in advance,    Iona Hansen  [facility]   [phone]

## 2022-08-06 NOTE — Unmapped (Unsigned)
[place] [facility]     CONSULTING PROVIDERS  [place] [facility]iliary Team  ____________________________________________________________________    CANCER HISTORY  Diagnosis: Pancreatic Adenocarcinoma, imaging concerning for metastatic disease to liver  1. 12/12/21: Presented with abdominal pain, weight loss found to have CBD obstruction with both pancreatic head and tail mass along with liver lesion. EUS/ERCP with CBD stent placement.  Biopsy of pancreatic tail mass with adenocarcinoma. Concern for metastatic disease with 1 cm liver lesion.  Also, local extension to left adrenal.   2. 5/4 start FOLFIRINOX (Cy 1 FOLFOX)    Molecular:  Somatic: KRAS, TP53, CCND3, TFEB, VEGFA, TMP 6.3 m/MB  Germline: negative   ____________________________________________________________________    ASSESSMENT  1. 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 Ground glass opacities on CT, asymptomatic and likely r/t to recent virus  3. Cancer associated abdominal pain -controlled  3. Opioid Induced Constipation -controlled  4. Chemo neuropathy, grade 1, ctm  5. Chemotherapy induced neutropenia - managed with GCSF  6. Chemotherapy induced thrombocytopenia - improved    RECOMMENDATIONS  1. mFOLIRINOX-->  FOLFIRI + GCSF support   2. Oxycodone 5mg  every 4 hours prn   3. Continue Miralax and Senna    4. Scheduled dex x 3 days post chemo, zofran, compazine prn  5. RTC 2 weeks for treatment; 4 weeks visit and scans and treatment. [Pump disconnect at [place]]    DISCUSSION        Decision-making today was high complexity on the basis of discussion of cancer, and weighing use of high risk chemotherapy, toxicity and complications/comorbidity of metastatic pancreatic cancer.  ______________________________________________________________________  HISTORY     Norma Green is seen today at the [facility] for ongoing management regarding pancreatic cancer. Energy    Neuropathy    Pain    appetite    MEDICAL HISTORY  1. HTN  2. Anxiety    Allergies   Allergen Reactions    Metronidazole Nausea And Vomiting     Medications reviewed in the EMR    SOCIAL HISTORY  Lives with family including husband. Smoker. No alcohol or drug use.  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 New grandbaby on the way 03/2022    PHYSICAL EXAM  There were no vitals filed for this visit.  GENERAL: well developed, well nourished, in no distress  PSYCH: full and appropriate range of affect with good insight and judgement  HEENT: NCAT, pupils equal, sclerae anicteric  EXT: warm and well perfused, no edema  SKIN: No rashes    OBJECTIVE DATA  LABS  Labs reviewed in emr, ok for treatment    RADIOLOGY RESULTS   None today

## 2022-08-07 NOTE — Unmapped (Signed)
RD contacted pt regarding request for oral nutrition supplements through Medicaid. Informed pt Medicaid does not cover oral nutrition supplements for adults. Provided suggestions for cheaper alternatives such as Equ[employer] Plus or [employer] with milk. Encouraged pt to reach out with any additional questions/concerns.

## 2022-08-07 NOTE — Unmapped (Signed)
Outpatient Oncology Social Work  Follow Counsellor  Rec'd T/C from Greer Ee, [facility] [phone]), who is requesting SW assistance to place multiple healthcare need requests on patient's behalf:  - Pt needs the following DME: rollating walker with a seat; and a shower chair  Sent in-basket message to Ted Mcalpine, RN Navigator, who will place DME order     Clydie Braun also relayed that Pt asked about getting protein drinks through her Medicaid insurance benefits in order to gain weight.  Sent in-basket message to Allen Derry, RD/LDN, who met with patient in July '23.    Transportation Needs  Clydie Braun also relayed that Pt requests additional [employer] gas cards, for which she is already approved.  Per [employer] spread sheet, Pt is eligible for additional gas cards as of 07/17/22.  Sent request to Lorn Junes, Administrator of [employer] gas card program with request to mail patient additional gas cards.    Follow-Up Plan:  Pt has this SW'ers contact information and will reach out for psychosocial assistance as needed.     Claris Gladden, OSW-C  Oncology Outpatient Social Worker  Phone: [phone]

## 2022-08-08 ENCOUNTER — Ambulatory Visit
Admit: 2022-08-08 | Discharge: 2022-08-09 | Payer: PRIVATE HEALTH INSURANCE | Attending: Hematology & Oncology | Primary: Hematology & Oncology

## 2022-08-08 ENCOUNTER — Ambulatory Visit: Admit: 2022-08-08 | Discharge: 2022-08-09 | Payer: PRIVATE HEALTH INSURANCE

## 2022-08-08 ENCOUNTER — Other Ambulatory Visit: Admit: 2022-08-08 | Discharge: 2022-08-09 | Payer: PRIVATE HEALTH INSURANCE

## 2022-08-08 DIAGNOSIS — C259 Malignant neoplasm of pancreas, unspecified: Principal | ICD-10-CM

## 2022-08-08 DIAGNOSIS — G629 Polyneuropathy, unspecified: Principal | ICD-10-CM

## 2022-08-08 DIAGNOSIS — R262 Difficulty in walking, not elsewhere classified: Principal | ICD-10-CM

## 2022-08-08 LAB — CBC W/ AUTO DIFF
BASOPHILS ABSOLUTE COUNT: 0 10*9/L (ref 0.0–0.1)
BASOPHILS RELATIVE PERCENT: 0.3 %
EOSINOPHILS ABSOLUTE COUNT: 0.1 10*9/L (ref 0.0–0.5)
EOSINOPHILS RELATIVE PERCENT: 0.7 %
HEMATOCRIT: 32.9 % — ABNORMAL LOW (ref 34.0–44.0)
HEMOGLOBIN: 10.8 g/dL — ABNORMAL LOW (ref 11.3–14.9)
LYMPHOCYTES ABSOLUTE COUNT: 1.8 10*9/L (ref 1.1–3.6)
LYMPHOCYTES RELATIVE PERCENT: 22.5 %
MEAN CORPUSCULAR HEMOGLOBIN CONC: 32.7 g/dL (ref 32.0–36.0)
MEAN CORPUSCULAR HEMOGLOBIN: 28.4 pg (ref 25.9–32.4)
MEAN CORPUSCULAR VOLUME: 86.8 fL (ref 77.6–95.7)
MEAN PLATELET VOLUME: 7.3 fL (ref 6.8–10.7)
MONOCYTES ABSOLUTE COUNT: 0.6 10*9/L (ref 0.3–0.8)
MONOCYTES RELATIVE PERCENT: 7.8 %
NEUTROPHILS ABSOLUTE COUNT: 5.7 10*9/L (ref 1.8–7.8)
NEUTROPHILS RELATIVE PERCENT: 68.7 %
PLATELET COUNT: 145 10*9/L — ABNORMAL LOW (ref 150–450)
RED BLOOD CELL COUNT: 3.79 10*12/L — ABNORMAL LOW (ref 3.95–5.13)
RED CELL DISTRIBUTION WIDTH: 14.7 % (ref 12.2–15.2)
WBC ADJUSTED: 8.2 10*9/L (ref 3.6–11.2)

## 2022-08-08 LAB — COMPREHENSIVE METABOLIC PANEL
ALBUMIN: 3.2 g/dL — ABNORMAL LOW (ref 3.4–5.0)
ALKALINE PHOSPHATASE: 113 U/L (ref 46–116)
ALT (SGPT): 25 U/L (ref 10–49)
ANION GAP: 8 mmol/L (ref 5–14)
AST (SGOT): 24 U/L (ref <=34)
BILIRUBIN TOTAL: 0.3 mg/dL (ref 0.3–1.2)
BLOOD UREA NITROGEN: 15 mg/dL (ref 9–23)
BUN / CREAT RATIO: 18
CALCIUM: 8.4 mg/dL — ABNORMAL LOW (ref 8.7–10.4)
CHLORIDE: 111 mmol/L — ABNORMAL HIGH (ref 98–107)
CO2: 25 mmol/L (ref 20.0–31.0)
CREATININE: 0.84 mg/dL
EGFR CKD-EPI (2021) FEMALE: 80 mL/min/{1.73_m2} (ref >=60)
GLUCOSE RANDOM: 93 mg/dL (ref 70–179)
POTASSIUM: 3 mmol/L — ABNORMAL LOW (ref 3.4–4.8)
PROTEIN TOTAL: 6.5 g/dL (ref 5.7–8.2)
SODIUM: 144 mmol/L (ref 135–145)

## 2022-08-08 LAB — MAGNESIUM: MAGNESIUM: 1.8 mg/dL (ref 1.6–2.6)

## 2022-08-08 MED ORDER — OXYCODONE 5 MG TABLET
ORAL_TABLET | Freq: Four times a day (QID) | ORAL | 0 refills | 23 days | Status: CP | PRN
Start: 2022-08-08 — End: 2022-09-07

## 2022-08-08 MED ADMIN — fluorouracil (ADRUCIL) 4,400 mg in sodium chloride (NS) 0.9 % 46-hr infusion CADD: 2400 mg/m2 | INTRAVENOUS | @ 17:00:00

## 2022-08-08 MED ADMIN — potassium chloride ER tablet 40 mEq: 40 meq | ORAL | @ 16:00:00

## 2022-08-08 MED ADMIN — ondansetron (ZOFRAN) tablet 8 mg: 8 mg | ORAL | @ 16:00:00 | Stop: 2022-08-08

## 2022-08-08 MED ADMIN — potassium chloride 20 mEq in 100 mL IVPB Premix: 20 meq | INTRAVENOUS | @ 16:00:00 | Stop: 2023-08-08

## 2022-08-08 MED ADMIN — leuCOVorin 736 mg in dextrose 5 % 50 mL IVPB: 400 mg/m2 | INTRAVENOUS | @ 16:00:00 | Stop: 2022-08-08

## 2022-08-08 NOTE — Unmapped (Signed)
Lab on 08/08/2022   Component Date Value Ref Range Status    Sodium 08/08/2022 144  135 - 145 mmol/L Final    Potassium 08/08/2022 3.0 (L)  3.4 - 4.8 mmol/L Final    Chloride 08/08/2022 111 (H)  98 - 107 mmol/L Final    CO2 08/08/2022 25.0  20.0 - 31.0 mmol/L Final    Anion Gap 08/08/2022 8  5 - 14 mmol/L Final    BUN 08/08/2022 15  9 - 23 mg/dL Final    Creatinine 16/05/9603 0.84  0.55 - 1.02 mg/dL Final    BUN/Creatinine Ratio 08/08/2022 18   Final    eGFR CKD-EPI (2021) Female 08/08/2022 80  >=60 mL/min/1.1m2 Final    eGFR calculated with CKD-EPI 2021 equation in accordance with [employer] and [employer] of Nephrology Task Force recommendations.    Glucose 08/08/2022 93  70 - 179 mg/dL Final    Calcium 54/04/8118 8.4 (L)  8.7 - 10.4 mg/dL Final    Albumin 14/78/2956 3.2 (L)  3.4 - 5.0 g/dL Final    Total Protein 08/08/2022 6.5  5.7 - 8.2 g/dL Final    Total Bilirubin 08/08/2022 0.3  0.3 - 1.2 mg/dL Final    AST 21/30/8657 24  <=34 U/L Final    ALT 08/08/2022 25  10 - 49 U/L Final    Alkaline Phosphatase 08/08/2022 113  46 - 116 U/L Final    Magnesium 08/08/2022 1.8  1.6 - 2.6 mg/dL Final    WBC 84/69/6295 8.2  3.6 - 11.2 10*9/L Final    RBC 08/08/2022 3.79 (L)  3.95 - 5.13 10*12/L Final    HGB 08/08/2022 10.8 (L)  11.3 - 14.9 g/dL Final    HCT 28/41/3244 32.9 (L)  34.0 - 44.0 % Final    MCV 08/08/2022 86.8  77.6 - 95.7 fL Final    MCH 08/08/2022 28.4  25.9 - 32.4 pg Final    MCHC 08/08/2022 32.7  32.0 - 36.0 g/dL Final    RDW 08/27/7251 14.7  12.2 - 15.2 % Final    MPV 08/08/2022 7.3  6.8 - 10.7 fL Final    Platelet 08/08/2022 145 (L)  150 - 450 10*9/L Final    Neutrophils % 08/08/2022 68.7  % Final    Lymphocytes % 08/08/2022 22.5  % Final    Monocytes % 08/08/2022 7.8  % Final    Eosinophils % 08/08/2022 0.7  % Final    Basophils % 08/08/2022 0.3  % Final    Absolute Neutrophils 08/08/2022 5.7  1.8 - 7.8 10*9/L Final    Absolute Lymphocytes 08/08/2022 1.8  1.1 - 3.6 10*9/L Final Absolute Monocytes 08/08/2022 0.6  0.3 - 0.8 10*9/L Final    Absolute Eosinophils 08/08/2022 0.1  0.0 - 0.5 10*9/L Final    Absolute Basophils 08/08/2022 0.0  0.0 - 0.1 10*9/L Final

## 2022-08-08 NOTE — Unmapped (Unsigned)
Port accessed.  CHG dressing applied.  Labs drawn & sent for analysis.  Port flushed & saline-locked.  To next appt.  Care provided by NW RN.

## 2022-08-08 NOTE — Unmapped (Signed)
1025 Pt arrived for scheduled infusion. Pt tolerated infusion and discharged home with family.

## 2022-08-08 NOTE — Unmapped (Signed)
Summary from today:  Just 5FU today  Do not need the white blood cell injection this weekend  Only take the dexamethasone steroid if you are nauseated, otherwise you dont need it.    For health related questions   For appointments & questions Monday through Friday 8 AM-- 5 PM   please call [phone] or Toll free [phone].    For urgent issues on Nights, Weekends and Holidays  Call [phone] and ask for the oncologist on call. This on-call doctor is able to help with symptoms  or other urgent issues. Please wait until regular office hours to call for prescription refills and questions about appointments.    [facility] MyChart is a great way to get in touch with [place]! We check this often throughout the day.   You can expect a response within 24 hours of sending a message. Please do not use MyChart   for urgent questions about symptoms you are having!     Your GI Medical OncologyTreatment Team    Doctor: Heber Cushing. Forbes Cellar, MD  Nurse Practitioner: Eula Fried, ANP  Nurse Navigator: Ted Mcalpine, RN  Clinical Pharmacist: Konrad Penta, PharmD  Scheduling Administrator: Ms. Lollie Sails

## 2022-08-10 ENCOUNTER — Ambulatory Visit: Admit: 2022-08-10 | Discharge: 2022-08-11 | Payer: PRIVATE HEALTH INSURANCE

## 2022-08-10 MED ADMIN — heparin, porcine (PF) 100 unit/mL injection 500 Units: 500 [IU] | INTRAVENOUS | @ 15:00:00 | Stop: 2022-08-11

## 2022-08-10 NOTE — Unmapped (Signed)
Pt to clinic for pump disconnect.  CADD pump completed, disconnected from pt and returned to pt.  Port flushed and de-accessed.  Pt left via wheelchair with family.

## 2022-08-22 ENCOUNTER — Ambulatory Visit: Admit: 2022-08-22 | Discharge: 2022-08-23 | Payer: PRIVATE HEALTH INSURANCE

## 2022-08-22 LAB — COMPREHENSIVE METABOLIC PANEL
ALBUMIN: 3.4 g/dL (ref 3.4–5.0)
ALKALINE PHOSPHATASE: 135 U/L — ABNORMAL HIGH (ref 46–116)
ALT (SGPT): 20 U/L (ref 10–49)
ANION GAP: 6 mmol/L (ref 5–14)
AST (SGOT): 22 U/L (ref <=34)
BILIRUBIN TOTAL: 0.7 mg/dL (ref 0.3–1.2)
BLOOD UREA NITROGEN: 21 mg/dL (ref 9–23)
BUN / CREAT RATIO: 28
CALCIUM: 9.2 mg/dL (ref 8.7–10.4)
CHLORIDE: 108 mmol/L — ABNORMAL HIGH (ref 98–107)
CO2: 29 mmol/L (ref 20.0–31.0)
CREATININE: 0.75 mg/dL
EGFR CKD-EPI (2021) FEMALE: >90 mL/min/{1.73_m2} (ref >=60)
GLUCOSE RANDOM: 115 mg/dL (ref 70–179)
POTASSIUM: 3.9 mmol/L (ref 3.5–5.1)
PROTEIN TOTAL: 6.8 g/dL (ref 5.7–8.2)
SODIUM: 143 mmol/L (ref 135–145)

## 2022-08-22 LAB — CBC W/ AUTO DIFF
BASOPHILS ABSOLUTE COUNT: 0.1 10*9/L (ref 0.0–0.1)
BASOPHILS RELATIVE PERCENT: 1.3 %
EOSINOPHILS ABSOLUTE COUNT: 0.1 10*9/L (ref 0.0–0.5)
EOSINOPHILS RELATIVE PERCENT: 2.5 %
HEMATOCRIT: 34.9 % (ref 34.0–44.0)
HEMOGLOBIN: 11.5 g/dL (ref 11.3–14.9)
LYMPHOCYTES ABSOLUTE COUNT: 1.6 10*9/L (ref 1.1–3.6)
LYMPHOCYTES RELATIVE PERCENT: 33.1 %
MEAN CORPUSCULAR HEMOGLOBIN CONC: 33 g/dL (ref 32.0–36.0)
MEAN CORPUSCULAR HEMOGLOBIN: 28.2 pg (ref 25.9–32.4)
MEAN CORPUSCULAR VOLUME: 85.3 fL (ref 77.6–95.7)
MEAN PLATELET VOLUME: 7.4 fL (ref 6.8–10.7)
MONOCYTES ABSOLUTE COUNT: 0.6 10*9/L (ref 0.3–0.8)
MONOCYTES RELATIVE PERCENT: 12.2 %
NEUTROPHILS ABSOLUTE COUNT: 2.5 10*9/L (ref 1.8–7.8)
NEUTROPHILS RELATIVE PERCENT: 50.9 %
PLATELET COUNT: 175 10*9/L (ref 150–450)
RED BLOOD CELL COUNT: 4.09 10*12/L (ref 3.95–5.13)
RED CELL DISTRIBUTION WIDTH: 14.8 % (ref 12.2–15.2)
WBC ADJUSTED: 4.8 10*9/L (ref 3.6–11.2)

## 2022-08-22 LAB — MAGNESIUM: MAGNESIUM: 2.1 mg/dL (ref 1.6–2.6)

## 2022-08-22 LAB — CANCER ANTIGEN 19-9: CA 19-9: 70.22 U/mL — ABNORMAL HIGH (ref 0–35)

## 2022-08-22 MED ADMIN — dexAMETHasone (DECADRON) tablet 20 mg: 20 mg | ORAL | @ 14:00:00 | Stop: 2022-08-22

## 2022-08-22 MED ADMIN — irinotecan (Camptosar) 276 mg in dextrose 5 % 500 mL IVPB: 150 mg/m2 | INTRAVENOUS | @ 16:00:00 | Stop: 2022-08-22

## 2022-08-22 MED ADMIN — leuCOVorin 736 mg in dextrose 5 % 50 mL IVPB: 400 mg/m2 | INTRAVENOUS | @ 16:00:00 | Stop: 2022-08-22

## 2022-08-22 MED ADMIN — prochlorperazine (COMPAZINE) tablet 10 mg: 10 mg | ORAL | @ 17:00:00

## 2022-08-22 MED ADMIN — fluorouracil (ADRUCIL) 4,400 mg in sodium chloride (NS) 0.9 % 46-hr infusion CADD: 2400 mg/m2 | INTRAVENOUS | @ 17:00:00

## 2022-08-22 MED ADMIN — dextrose 5 % infusion: 100 mL/h | INTRAVENOUS | @ 16:00:00

## 2022-08-22 MED ADMIN — ondansetron (ZOFRAN) tablet 24 mg: 24 mg | ORAL | @ 14:00:00 | Stop: 2022-08-22

## 2022-08-22 MED ADMIN — atropine injection: .4 mg | INTRAVENOUS | @ 15:00:00

## 2022-08-22 NOTE — Unmapped (Signed)
0800: Pt here for scheduled infusion.     1230: Pt tolerated treatment/infusion W/O difficulty. Port left accessed per protocol for home infusion.   Pt left infusion center via wheelchair. NAD, no questions nor complaints voiced at D/C. Pt aware of follow up.

## 2022-08-22 NOTE — Unmapped (Signed)
Latest Reference Range & Units 08/22/22 08:23   WBC 3.6 - 11.2 10*9/L 4.8   RBC 3.95 - 5.13 10*12/L 4.09   HGB 11.3 - 14.9 g/dL 81.1   HCT 91.4 - 78.2 % 34.9   MCV 77.6 - 95.7 fL 85.3   MCH 25.9 - 32.4 pg 28.2   MCHC 32.0 - 36.0 g/dL 95.6   RDW 21.3 - 08.6 % 14.8   MPV 6.8 - 10.7 fL 7.4   Platelet 150 - 450 10*9/L 175   Neutrophils % % 50.9   Lymphocytes % % 33.1   Monocytes % % 12.2   Eosinophils % % 2.5   Basophils % % 1.3   Absolute Neutrophils 1.8 - 7.8 10*9/L 2.5   Absolute Lymphocytes 1.1 - 3.6 10*9/L 1.6   Absolute Monocytes  0.3 - 0.8 10*9/L 0.6   Absolute Eosinophils 0.0 - 0.5 10*9/L 0.1   Absolute Basophils  0.0 - 0.1 10*9/L 0.1   Sodium 135 - 145 mmol/L 143   Potassium 3.5 - 5.1 mmol/L 3.9   Chloride 98 - 107 mmol/L 108 (H)   CO2 20.0 - 31.0 mmol/L 29.0   Bun 9 - 23 mg/dL 21   Creatinine 5.78 - 1.02 mg/dL 4.69   BUN/Creatinine Ratio  28   eGFR CKD-EPI (2021) Female >=60 mL/min/1.59m2 >90   Anion Gap 5 - 14 mmol/L 6   Glucose 70 - 179 mg/dL 629   Calcium 8.7 - 52.8 mg/dL 9.2   Magnesium 1.6 - 2.6 mg/dL 2.1   Albumin 3.4 - 5.0 g/dL 3.4   Total Protein 5.7 - 8.2 g/dL 6.8   Total Bilirubin 0.3 - 1.2 mg/dL 0.7   SGOT (AST) <=41 U/L 22   ALT 10 - 49 U/L 20   Alkaline Phosphatase 46 - 116 U/L 135 (H)   (H): Data is abnormally high

## 2022-08-24 ENCOUNTER — Ambulatory Visit: Admit: 2022-08-24 | Discharge: 2022-08-25 | Payer: PRIVATE HEALTH INSURANCE

## 2022-08-24 NOTE — Unmapped (Signed)
Patient arrived to chair triage.  No complaints noted.  Access of port intact with blood return and deaccessed.

## 2022-08-29 NOTE — Unmapped (Signed)
[facility] has made a third and final attempt to reach this patient to refill the following medication:Udenyca.      We have left voicemails on the following phone numbers: [phone], have sent a text message to the following phone numbers: [phone], and have sent a Mychart questionnaire..    Dates contacted: 12/20,27,1/4  Last scheduled delivery: 07/24/22    The patient may be at risk of non-compliance with this medication. The patient should call the [facility] at [phone]  Option 4, then Option 1 (oncology) to refill medication.    Norma Green Norma Green   [facility] Specialty Technician

## 2022-08-30 NOTE — Unmapped (Signed)
Upcoming Appt:  Future Appointments   Date Time Provider Department Center   09/05/2022  8:20 AM IC CT RM 1 ICTRL[facility] [place] - IC   09/05/2022  9:30 AM ADULT ONC LAB [facility]   09/05/2022 10:30 AM Sanoff, Mabeline Caras, MD ONCMULTI [facility]   09/05/2022 11:00 AM ONCINF CHAIR 18 HONC3UCA [facility]   09/07/2022  2:00 PM ONCINF CHAIR 08 HON[facility]3UCA [facility]   09/12/2022  8:30 AM ADULT ONC LAB [facility]   09/12/2022  9:00 AM HBR CT RM 1 HBRCT [place] - [facility]   09/12/2022 12:30 PM Sanoff, Mabeline Caras, MD ON[facility]I [facility]       Recent:   What is the date of your last related visit?  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 LOCATION: Where does it hurt?        Center of chest, shooting  2. RADIATION: Does the pain go anywhere else? (e.g., into neck, jaw, arms, back)      back  3. ONSET: When did the chest pain begin? (Minutes, hours or days)       2 days ago  4. PATTERN: Does the pain come and go, or has it been constant since it started?  Does it get worse with exertion?       Intermittent  5. DURATION: How long does it last (e.g., seconds, minutes, hours)      5 mins  6. SEVERITY: How bad is the pain?  (e.g., Scale 1-10; mild, moderate, or severe)     - MILD (1-3): doesn't interfere with normal activities      - MODERATE (4-7): interferes with normal activities or awakens from sleep     - SEVERE (8-10): excruciating pain, unable to do any normal activities        8/10  7. CARDIAC RISK FACTORS: Do you have any history of heart problems or risk factors for heart disease? (e.g., angina, prior heart attack; diabetes, high blood pressure, high cholesterol, smoker, or strong family history of heart disease)      HTN, Heart Murmur  8. PULMONARY RISK FACTORS: Do you have any history of lung disease?  (e.g., blood clots in lung, asthma, emphysema, birth control pills)      Denies  9. CAUSE: What do you think is causing the chest pain?      I don't know, it could be the medicine going out of my system.  10. OTHER SYMPTOMS: Do you have any other symptoms? (e.g., dizziness, nausea, vomiting, sweating, fever, difficulty breathing, cough)        Shortness of breath when it happens  11. PREGNANCY: Is there any chance you are pregnant? When was your last menstrual period?        NA    Protocols used: Chest Pain-A-AH

## 2022-08-30 NOTE — Unmapped (Signed)
Regarding: cancer patient, nagging chest and back pain, nausea;wants pain meds  ----- Message from Lamar Laundry, MA sent at 08/30/2022 12:56 PM EST -----  If you had not called [facility] Connect, what do you think you would have done?   Go to Emergency Dept

## 2022-09-01 NOTE — Unmapped (Signed)
RN Navigator contacted pt to follow up on triage call regarding chest pain/ back pain. Pt reports that she had an anxiety attack. Pain quickly resolved and has not returned. Stated that has not happened before or reoccured.    Pt denied any questions or concerns at this time. Encouraged her to call with any future needs.

## 2022-09-01 NOTE — Unmapped (Addendum)
Attempted to return call to Norma Green to follow up on the after hours phone call.  Left HIPAA compliant voicemail.  Provided callback number [phone].    Called her husband and was able to connect with Norma Green.  She said that the pain went away and she made an appt with her PCP on Wednesday. She said her pain wasn't bad, bad and wasn't serious.  She was a bit panicky when she called.     Advised to call if she gets worse in anyway.     Team updated.

## 2022-09-02 DIAGNOSIS — T451X5A Adverse effect of antineoplastic and immunosuppressive drugs, initial encounter: Principal | ICD-10-CM

## 2022-09-02 DIAGNOSIS — D701 Agranulocytosis secondary to cancer chemotherapy: Principal | ICD-10-CM

## 2022-09-02 DIAGNOSIS — C259 Malignant neoplasm of pancreas, unspecified: Principal | ICD-10-CM

## 2022-09-02 MED ORDER — UDENYCA 6 MG/0.6 ML SUBCUTANEOUS SYRINGE
SUBCUTANEOUS | 5 refills | 28 days | Status: CP
Start: 2022-09-02 — End: ?
  Filled 2022-09-04: qty 1.2, 28d supply, fill #0

## 2022-09-02 NOTE — Unmapped (Signed)
Hi,     Emmaclaire L Haydon contacted the Communication Center requesting to speak with the care team of Taffy L Hector to discuss:    -that she had not received the shot that she is going to be taking after her chemo treatment on 09/05/22 and she is not making any progress with the number she was given to get the shot.     Please contact Maanasa L Lupien  at [phone].    Thank you,   Durward Fortes  [facility] Communication Center   [phone]

## 2022-09-02 NOTE — Unmapped (Signed)
Returned call to pt. Let her know I spoke with [facility] and she has 1 refill available.She states they will call to arrange delivery. Asked her to call back with any further needs.Will have Dr. Lawernce Ion team send in a refill for next doses.

## 2022-09-03 NOTE — Unmapped (Signed)
[facility] Refill Coordination Note    Specialty Medication(s) to be Shipped:   Hematology/Oncology: Norma Green    Other medication(s) to be shipped: No additional medications requested for fill at this time     Norma Green, DOB: 1960/10/15  Phone: [phone] (home) [phone] (work)      All above HIPAA information was verified with patient.     Was a Nurse, learning disability used for this call? No    Completed refill call assessment today to schedule patient's medication shipment from the [facility] [phone]).  All relevant notes have been reviewed.     Specialty medication(s) and dose(s) confirmed: Regimen is correct and unchanged.   Changes to medications: Norma Green reports no changes at this time.  Changes to insurance: No  New side effects reported not previously addressed with a pharmacist or physician: None reported  Questions for the pharmacist: No    Confirmed patient received a Conservation officer, historic buildings and a Surveyor, mining with first shipment. The patient will receive a drug information handout for each medication shipped and additional FDA Medication Guides as required.       DISEASE/MEDICATION-SPECIFIC INFORMATION        For patients on injectable medications: Patient currently has 0 doses left.  Next injection is scheduled for 09/08/22.    SPECIALTY MEDICATION ADHERENCE     Medication Adherence    Patient reported X missed doses in the last month: 0  Specialty Medication: Udenyca 6 mg/0.41mL  Patient is on additional specialty medications: No  Informant: patient                  Confirmed plan for next specialty medication refill: delivery by pharmacy  Refills needed for supportive medications: not needed          Refill Coordination    Has the Patients' Contact Information Changed: No  Is the Shipping Address Different: No         Were doses missed due to medication being on hold? No    Udenyca 6 mg: 0 days of medicine on hand     REFERRAL TO PHARMACIST     Referral to the pharmacist: Not needed      [facility]     Shipping address confirmed in Epic.     Delivery Scheduled: Yes, Expected medication delivery date: 09/04/22.     Medication will be delivered via Same Day Courier to the prescription address in Epic WAM.    Kermit Balo, [facility]   [facility] Specialty Pharmacist

## 2022-09-05 ENCOUNTER — Ambulatory Visit: Admit: 2022-09-05 | Discharge: 2022-09-06 | Payer: PRIVATE HEALTH INSURANCE

## 2022-09-05 ENCOUNTER — Ambulatory Visit
Admit: 2022-09-05 | Discharge: 2022-09-06 | Payer: PRIVATE HEALTH INSURANCE | Attending: Hematology & Oncology | Primary: Hematology & Oncology

## 2022-09-05 ENCOUNTER — Other Ambulatory Visit: Admit: 2022-09-05 | Discharge: 2022-09-06 | Payer: PRIVATE HEALTH INSURANCE

## 2022-09-05 DIAGNOSIS — T451X5A Adverse effect of antineoplastic and immunosuppressive drugs, initial encounter: Principal | ICD-10-CM

## 2022-09-05 DIAGNOSIS — G620 Drug-induced polyneuropathy: Principal | ICD-10-CM

## 2022-09-05 DIAGNOSIS — C259 Malignant neoplasm of pancreas, unspecified: Principal | ICD-10-CM

## 2022-09-05 LAB — CBC W/ AUTO DIFF
BASOPHILS ABSOLUTE COUNT: 0 10*9/L (ref 0.0–0.1)
BASOPHILS RELATIVE PERCENT: 0.5 %
EOSINOPHILS ABSOLUTE COUNT: 0.1 10*9/L (ref 0.0–0.5)
EOSINOPHILS RELATIVE PERCENT: 0.8 %
HEMATOCRIT: 34.4 % (ref 34.0–44.0)
HEMOGLOBIN: 11.6 g/dL (ref 11.3–14.9)
LYMPHOCYTES ABSOLUTE COUNT: 1.9 10*9/L (ref 1.1–3.6)
LYMPHOCYTES RELATIVE PERCENT: 21.4 %
MEAN CORPUSCULAR HEMOGLOBIN CONC: 33.6 g/dL (ref 32.0–36.0)
MEAN CORPUSCULAR HEMOGLOBIN: 28.3 pg (ref 25.9–32.4)
MEAN CORPUSCULAR VOLUME: 84.3 fL (ref 77.6–95.7)
MEAN PLATELET VOLUME: 7.8 fL (ref 6.8–10.7)
MONOCYTES ABSOLUTE COUNT: 0.6 10*9/L (ref 0.3–0.8)
MONOCYTES RELATIVE PERCENT: 6.8 %
NEUTROPHILS ABSOLUTE COUNT: 6.2 10*9/L (ref 1.8–7.8)
NEUTROPHILS RELATIVE PERCENT: 70.5 %
PLATELET COUNT: 122 10*9/L — ABNORMAL LOW (ref 150–450)
RED BLOOD CELL COUNT: 4.08 10*12/L (ref 3.95–5.13)
RED CELL DISTRIBUTION WIDTH: 15.3 % — ABNORMAL HIGH (ref 12.2–15.2)
WBC ADJUSTED: 8.7 10*9/L (ref 3.6–11.2)

## 2022-09-05 LAB — COMPREHENSIVE METABOLIC PANEL
ALBUMIN: 3.6 g/dL (ref 3.4–5.0)
ALKALINE PHOSPHATASE: 144 U/L — ABNORMAL HIGH (ref 46–116)
ALT (SGPT): 19 U/L (ref 10–49)
ANION GAP: 5 mmol/L (ref 5–14)
AST (SGOT): 21 U/L (ref <=34)
BILIRUBIN TOTAL: 0.5 mg/dL (ref 0.3–1.2)
BLOOD UREA NITROGEN: 10 mg/dL (ref 9–23)
BUN / CREAT RATIO: 14
CALCIUM: 9.2 mg/dL (ref 8.7–10.4)
CHLORIDE: 107 mmol/L (ref 98–107)
CO2: 29 mmol/L (ref 20.0–31.0)
CREATININE: 0.73 mg/dL
EGFR CKD-EPI (2021) FEMALE: >90 mL/min/{1.73_m2} (ref >=60)
GLUCOSE RANDOM: 96 mg/dL (ref 70–179)
POTASSIUM: 4.1 mmol/L (ref 3.4–4.8)
PROTEIN TOTAL: 7 g/dL (ref 5.7–8.2)
SODIUM: 141 mmol/L (ref 135–145)

## 2022-09-05 LAB — MAGNESIUM: MAGNESIUM: 2 mg/dL (ref 1.6–2.6)

## 2022-09-05 MED ORDER — OXYCODONE 5 MG TABLET
ORAL_TABLET | Freq: Four times a day (QID) | ORAL | 0 refills | 23 days | Status: CP | PRN
Start: 2022-09-05 — End: 2022-10-05

## 2022-09-05 MED ADMIN — dextrose 5 % infusion: 100 mL/h | INTRAVENOUS | @ 17:00:00

## 2022-09-05 MED ADMIN — ondansetron (ZOFRAN) tablet 24 mg: 24 mg | ORAL | @ 17:00:00 | Stop: 2022-09-05

## 2022-09-05 MED ADMIN — irinotecan (Camptosar) 276 mg in dextrose 5 % 500 mL IVPB: 150 mg/m2 | INTRAVENOUS | @ 17:00:00 | Stop: 2022-09-05

## 2022-09-05 MED ADMIN — dexAMETHasone (DECADRON) tablet 20 mg: 20 mg | ORAL | @ 17:00:00 | Stop: 2022-09-05

## 2022-09-05 MED ADMIN — fluorouracil (ADRUCIL) 4,400 mg in sodium chloride (NS) 0.9 % 46-hr infusion CADD: 2400 mg/m2 | INTRAVENOUS | @ 19:00:00

## 2022-09-05 MED ADMIN — atropine injection: .4 mg | INTRAVENOUS | @ 17:00:00

## 2022-09-05 MED ADMIN — leuCOVorin 736 mg in dextrose 5 % 50 mL IVPB: 400 mg/m2 | INTRAVENOUS | @ 17:00:00 | Stop: 2022-09-05

## 2022-09-05 MED ADMIN — prochlorperazine (COMPAZINE) tablet 10 mg: 10 mg | ORAL | @ 18:00:00

## 2022-09-05 MED ADMIN — iohexol (OMNIPAQUE) 350 mg iodine/mL solution 100 mL: 100 mL | INTRAVENOUS | @ 14:00:00 | Stop: 2022-09-05

## 2022-09-05 NOTE — Unmapped (Signed)
[facility]     CONSULTING PROVIDERS  [place] Biliary Team  ____________________________________________________________________    CANCER HISTORY  Diagnosis: Pancreatic Adenocarcinoma, imaging concerning for metastatic disease to liver  1. 12/12/21: Presented with abdominal pain, weight loss found to have CBD obstruction with both pancreatic head and tail mass along with liver lesion. EUS/ERCP with CBD stent placement.  Biopsy of pancreatic tail mass with adenocarcinoma. Concern for metastatic disease with 1 cm liver lesion.  Also, local extension to left adrenal.   2. 5/4 start FOLFIRINOX (Cy 1 FOLFOX)    Molecular:  Somatic: KRAS, TP53, CCND3, TFEB, VEGFA, TMP 6.3 m/MB  Germline: negative   ____________________________________________________________________    ASSESSMENT  1. 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 Ground glass opacities on CT, asymptomatic and likely r/t to recent virus: remains asymptomatic  3. Cancer associated abdominal pain -controlled  3. Opioid Induced Constipation -controlled  4. Chemo neuropathy, grade 1, ctm  5. Chemotherapy induced neutropenia - managed with GCSF  6. Diarrhea, resolvign    RECOMMENDATIONS  1. mFOLIRINOX-->  FOLFIRI + GCSF support.   2. Oxycodone 5mg  every 4 hours prn   3. Continue Miralax and Senna    4. Scheduled dex x 3 days post chemo, zofran, compazine prn  5. RTC 2 weeks for treatment; 4 weeks visit and treatment. [Pump disconnect at [place]]    DISCUSSION   Tolerating treatment well though with some neuropathy residual, some cramping from irinotecan.     Decision-making today was high complexity on the basis of discussion of cancer, and weighing use of high risk chemotherapy, toxicity and complications/comorbidity of metastatic pancreatic cancer.    Advance Care Planning     Participants: Norma Green and husband  Discussion/Action: She asked about her prognosis and when the cancer is going to go away. We reviewed the incurable nature of pancreatic cancer again, and that our treatment is given to control her cancer but that it will not make it go away or put it in remission. Reviewed why surgery is not an option. She admits to being scared and not wanting to die. Wants to continue treatment.     Health Care Decision Maker as of 09/05/2022           ______________________________________________________________________  HISTORY     Norma Green is seen today at the [facility] for ongoing management regarding pancreatic cancer.      Doing pretty well though tired of getting chemo  Cramping abd pain for a few days after infusions. Not bad diarrhea.   Appetite good, minimal nausae  Residual neuropathy     MEDICAL HISTORY  1. HTN  2. Anxiety    Allergies   Allergen Reactions    Metronidazole Nausea And Vomiting     Medications reviewed in the EMR    SOCIAL HISTORY  Lives with family including husband. Smoker. No alcohol or drug use.  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 New grandbaby on the way 03/2022    PHYSICAL EXAM  There were no vitals filed for this visit.  deferred    OBJECTIVE DATA  LABS  Labs reviewed in emr, ok for treatment    RADIOLOGY RESULTS   Ct today is personally reviewed by me and my interpretation pending rads read  - maybe slight increase in a liver met but it's only a few mm across  - stable panc mass  - stable necrotic node  -  GGO in lungs nearly resolved

## 2022-09-05 NOTE — Unmapped (Signed)
Lab on 09/05/2022   Component Date Value Ref Range Status    Sodium 09/05/2022 141  135 - 145 mmol/L Final    Potassium 09/05/2022 4.1  3.4 - 4.8 mmol/L Final    Chloride 09/05/2022 107  98 - 107 mmol/L Final    CO2 09/05/2022 29.0  20.0 - 31.0 mmol/L Final    Anion Gap 09/05/2022 5  5 - 14 mmol/L Final    BUN 09/05/2022 10  9 - 23 mg/dL Final    Creatinine 52/84/1324 0.73  0.55 - 1.02 mg/dL Final    BUN/Creatinine Ratio 09/05/2022 14   Final    eGFR CKD-EPI (2021) Female 09/05/2022 >90  >=60 mL/min/1.32m2 Final    eGFR calculated with CKD-EPI 2021 equation in accordance with [employer] and [employer] of Nephrology Task Force recommendations.    Glucose 09/05/2022 96  70 - 179 mg/dL Final    Calcium 40/05/2724 9.2  8.7 - 10.4 mg/dL Final    Albumin 36/64/4034 3.6  3.4 - 5.0 g/dL Final    Total Protein 09/05/2022 7.0  5.7 - 8.2 g/dL Final    Total Bilirubin 09/05/2022 0.5  0.3 - 1.2 mg/dL Final    AST 74/25/9563 21  <=34 U/L Final    ALT 09/05/2022 19  10 - 49 U/L Final    Alkaline Phosphatase 09/05/2022 144 (H)  46 - 116 U/L Final    Magnesium 09/05/2022 2.0  1.6 - 2.6 mg/dL Final    WBC 87/56/4332 8.7  3.6 - 11.2 10*9/L Final    RBC 09/05/2022 4.08  3.95 - 5.13 10*12/L Final    HGB 09/05/2022 11.6  11.3 - 14.9 g/dL Final    HCT 95/18/8416 34.4  34.0 - 44.0 % Final    MCV 09/05/2022 84.3  77.6 - 95.7 fL Final    MCH 09/05/2022 28.3  25.9 - 32.4 pg Final    MCHC 09/05/2022 33.6  32.0 - 36.0 g/dL Final    RDW 60/63/0160 15.3 (H)  12.2 - 15.2 % Final    MPV 09/05/2022 7.8  6.8 - 10.7 fL Final    Platelet 09/05/2022 122 (L)  150 - 450 10*9/L Final    Neutrophils % 09/05/2022 70.5  % Final    Lymphocytes % 09/05/2022 21.4  % Final    Monocytes % 09/05/2022 6.8  % Final    Eosinophils % 09/05/2022 0.8  % Final    Basophils % 09/05/2022 0.5  % Final    Absolute Neutrophils 09/05/2022 6.2  1.8 - 7.8 10*9/L Final    Absolute Lymphocytes 09/05/2022 1.9  1.1 - 3.6 10*9/L Final    Absolute Monocytes 09/05/2022 0.6  0.3 - 0.8 10*9/L Final    Absolute Eosinophils 09/05/2022 0.1  0.0 - 0.5 10*9/L Final    Absolute Basophils 09/05/2022 0.0  0.0 - 0.1 10*9/L Final

## 2022-09-05 NOTE — Unmapped (Signed)
Patient arrived to clinic for scheduled infusion. Port flushed, blood return noted, infusing. Labs meet parameters to treat. Pre-meds given per [facility]. Medication infused per orders, tolerated well, no distress noted. Port flushed, 5FU pump connected, clamps tightened and connections tightened, pump turned on and running.

## 2022-09-07 ENCOUNTER — Ambulatory Visit: Admit: 2022-09-07 | Discharge: 2022-09-08 | Payer: PRIVATE HEALTH INSURANCE

## 2022-09-07 MED ADMIN — alteplase (ACTIVase) injection small catheter clearance 2 mg: 2 mg | INTRAVENOUS | @ 19:00:00 | Stop: 2022-09-07

## 2022-09-07 MED ADMIN — heparin, porcine (PF) 100 unit/mL injection 500 Units: 500 [IU] | INTRAVENOUS | @ 19:00:00 | Stop: 2022-09-08

## 2022-09-07 NOTE — Unmapped (Signed)
Pt to clinic for pump disconnect.  Port had no blood return and sluggish.  TPA applied for x30 minutes and blood return was present.  Port flushed and de-accessed.  Pt left via wheelchair with spouse.

## 2022-09-19 ENCOUNTER — Ambulatory Visit: Admit: 2022-09-19 | Discharge: 2022-09-20 | Payer: PRIVATE HEALTH INSURANCE

## 2022-09-19 LAB — CBC W/ AUTO DIFF
BASOPHILS ABSOLUTE COUNT: 0 10*9/L (ref 0.0–0.1)
BASOPHILS RELATIVE PERCENT: 0.3 %
EOSINOPHILS ABSOLUTE COUNT: 0.1 10*9/L (ref 0.0–0.5)
EOSINOPHILS RELATIVE PERCENT: 0.7 %
HEMATOCRIT: 34 % (ref 34.0–44.0)
HEMOGLOBIN: 11.2 g/dL — ABNORMAL LOW (ref 11.3–14.9)
LYMPHOCYTES ABSOLUTE COUNT: 1.9 10*9/L (ref 1.1–3.6)
LYMPHOCYTES RELATIVE PERCENT: 22 %
MEAN CORPUSCULAR HEMOGLOBIN CONC: 32.9 g/dL (ref 32.0–36.0)
MEAN CORPUSCULAR HEMOGLOBIN: 27.9 pg (ref 25.9–32.4)
MEAN CORPUSCULAR VOLUME: 84.9 fL (ref 77.6–95.7)
MEAN PLATELET VOLUME: 7.4 fL (ref 6.8–10.7)
MONOCYTES ABSOLUTE COUNT: 0.7 10*9/L (ref 0.3–0.8)
MONOCYTES RELATIVE PERCENT: 7.8 %
NEUTROPHILS ABSOLUTE COUNT: 6 10*9/L (ref 1.8–7.8)
NEUTROPHILS RELATIVE PERCENT: 69.2 %
PLATELET COUNT: 163 10*9/L (ref 150–450)
RED BLOOD CELL COUNT: 4.01 10*12/L (ref 3.95–5.13)
RED CELL DISTRIBUTION WIDTH: 15.8 % — ABNORMAL HIGH (ref 12.2–15.2)
WBC ADJUSTED: 8.7 10*9/L (ref 3.6–11.2)

## 2022-09-19 LAB — COMPREHENSIVE METABOLIC PANEL
ALBUMIN: 3.5 g/dL (ref 3.4–5.0)
ALKALINE PHOSPHATASE: 121 U/L — ABNORMAL HIGH (ref 46–116)
ALT (SGPT): 28 U/L (ref 10–49)
ANION GAP: 6 mmol/L (ref 5–14)
AST (SGOT): 20 U/L (ref <=34)
BILIRUBIN TOTAL: 0.5 mg/dL (ref 0.3–1.2)
BLOOD UREA NITROGEN: 11 mg/dL (ref 9–23)
BUN / CREAT RATIO: 16
CALCIUM: 8.6 mg/dL — ABNORMAL LOW (ref 8.7–10.4)
CHLORIDE: 106 mmol/L (ref 98–107)
CO2: 30 mmol/L (ref 20.0–31.0)
CREATININE: 0.68 mg/dL
EGFR CKD-EPI (2021) FEMALE: >90 mL/min/{1.73_m2} (ref >=60)
GLUCOSE RANDOM: 97 mg/dL (ref 70–179)
POTASSIUM: 3.8 mmol/L (ref 3.4–4.8)
PROTEIN TOTAL: 7.1 g/dL (ref 5.7–8.2)
SODIUM: 142 mmol/L (ref 135–145)

## 2022-09-19 LAB — MAGNESIUM: MAGNESIUM: 2 mg/dL (ref 1.6–2.6)

## 2022-09-19 MED ADMIN — ondansetron (ZOFRAN) tablet 24 mg: 24 mg | ORAL | @ 17:00:00 | Stop: 2022-09-19

## 2022-09-19 MED ADMIN — dextrose 5 % infusion: 100 mL/h | INTRAVENOUS | @ 17:00:00

## 2022-09-19 MED ADMIN — dexAMETHasone (DECADRON) tablet 20 mg: 20 mg | ORAL | @ 17:00:00 | Stop: 2022-09-19

## 2022-09-19 MED ADMIN — irinotecan (Camptosar) 276 mg in dextrose 5 % 500 mL IVPB: 150 mg/m2 | INTRAVENOUS | @ 17:00:00 | Stop: 2022-09-19

## 2022-09-19 MED ADMIN — atropine injection: .4 mg | INTRAVENOUS | @ 17:00:00

## 2022-09-19 MED ADMIN — fluorouracil (ADRUCIL) 4,400 mg in sodium chloride (NS) 0.9 % 46-hr infusion CADD: 2400 mg/m2 | INTRAVENOUS | @ 20:00:00

## 2022-09-19 MED ADMIN — leuCOVorin 736 mg in dextrose 5 % 50 mL IVPB: 400 mg/m2 | INTRAVENOUS | @ 19:00:00 | Stop: 2022-09-19

## 2022-09-19 NOTE — Unmapped (Signed)
Patient arrived at 1029 for scheduled FOLFIRI. Port flushed, blood return noted, infusing. Labs meet parameters to treat. Pre-meds given per [facility]. Medication infused per orders, tolerated well, no distress noted. Port flushed, 5FU pump connected, clamps tightened and connections tightened, pump turned on and running. Patient discharged in stable condition with support person.

## 2022-09-19 NOTE — Unmapped (Signed)
Hospital Outpatient Visit on 09/19/2022   Component Date Value Ref Range Status    Sodium 09/19/2022 142  135 - 145 mmol/L Final    Potassium 09/19/2022 3.8  3.4 - 4.8 mmol/L Final    Chloride 09/19/2022 106  98 - 107 mmol/L Final    CO2 09/19/2022 30.0  20.0 - 31.0 mmol/L Final    Anion Gap 09/19/2022 6  5 - 14 mmol/L Final    BUN 09/19/2022 11  9 - 23 mg/dL Final    Creatinine 16/05/9603 0.68  0.55 - 1.02 mg/dL Final    BUN/Creatinine Ratio 09/19/2022 16   Final    eGFR CKD-EPI (2021) Female 09/19/2022 >90  >=60 mL/min/1.69m2 Final    eGFR calculated with CKD-EPI 2021 equation in accordance with [employer] and [employer] of Nephrology Task Force recommendations.    Glucose 09/19/2022 97  70 - 179 mg/dL Final    Calcium 54/04/8118 8.6 (L)  8.7 - 10.4 mg/dL Final    Albumin 14/78/2956 3.5  3.4 - 5.0 g/dL Final    Total Protein 09/19/2022 7.1  5.7 - 8.2 g/dL Final    Total Bilirubin 09/19/2022 0.5  0.3 - 1.2 mg/dL Final    AST 21/30/8657 20  <=34 U/L Final    ALT 09/19/2022 28  10 - 49 U/L Final    Alkaline Phosphatase 09/19/2022 121 (H)  46 - 116 U/L Final    Magnesium 09/19/2022 2.0  1.6 - 2.6 mg/dL Final    WBC 84/69/6295 8.7  3.6 - 11.2 10*9/L Final    RBC 09/19/2022 4.01  3.95 - 5.13 10*12/L Final    HGB 09/19/2022 11.2 (L)  11.3 - 14.9 g/dL Final    HCT 28/41/3244 34.0  34.0 - 44.0 % Final    MCV 09/19/2022 84.9  77.6 - 95.7 fL Final    MCH 09/19/2022 27.9  25.9 - 32.4 pg Final    MCHC 09/19/2022 32.9  32.0 - 36.0 g/dL Final    RDW 08/27/7251 15.8 (H)  12.2 - 15.2 % Final    MPV 09/19/2022 7.4  6.8 - 10.7 fL Final    Platelet 09/19/2022 163  150 - 450 10*9/L Final    Neutrophils % 09/19/2022 69.2  % Final    Lymphocytes % 09/19/2022 22.0  % Final    Monocytes % 09/19/2022 7.8  % Final    Eosinophils % 09/19/2022 0.7  % Final    Basophils % 09/19/2022 0.3  % Final    Absolute Neutrophils 09/19/2022 6.0  1.8 - 7.8 10*9/L Final    Absolute Lymphocytes 09/19/2022 1.9  1.1 - 3.6 10*9/L Final Absolute Monocytes 09/19/2022 0.7  0.3 - 0.8 10*9/L Final    Absolute Eosinophils 09/19/2022 0.1  0.0 - 0.5 10*9/L Final    Absolute Basophils 09/19/2022 0.0  0.0 - 0.1 10*9/L Final          RED ZONE Means: RED ZONE: Take action now!     You need to be seen right away  Symptoms are at a severe level of discomfort    Call 911 or go to your nearest  Hospital for help     - Bleeding that will not stop    - Hard to breathe    - New seizure - Chest pain  - Fall or passing out  -Thoughts of hurting    yourself or others      Call 911 if you are going into the RED ZONE  YELLOW ZONE Means:     Please call with any new or worsening symptom(s), even if not on this list.  Call [phone]  After hours, weekends, and holidays - you will reach a long recording with specific instructions, If not in an emergency such as above, please listen closely all the way to the end and choose the option that relates to your need.   You can be seen by a provider the same day through our Same Day Acute Care for Patients with Cancer program.      YELLOW ZONE: Take action today     Symptoms are new or worsening  You are not within your goal range for:    - Pain    - Shortness of breath    - Bleeding (nose, urine, stool, wound)    - Feeling sick to your stomach and throwing up    - Mouth sores/pain in your mouth or throat    - Hard stool or very loose stools (increase in       ostomy output)    - No urine for 12 hours    - Feeding tube or other catheter/tube issue    - Redness or pain at previous IV or port/catheter site    - Depressed or anxiety   - Swelling (leg, arm, abdomen,     face, neck)  - Skin rash or skin changes  - Wound issues (redness, drainage,    re-opened)  - Confusion  - Vision changes  - Fever >100.4 F or chills  - Worsening cough with mucus that is    green, yellow, or bloody  - Pain or burning when going to the    bathroom  - Home Infusion Pump Issue- call    [phone]         Call your healthcare provider if you are going into the YELLOW ZONE     GREEN ZONE Means:  Your symptoms are under controls  Continue to take your medicine as ordered  Keep all visits to the provider GREEN ZONE: You are in control  No increase or worsening symptoms  Able to take your medicine  Able to drink and eat    - DO NOT use MyChart messages to report red or yellow symptoms. Allow up to 3    business days for a reply.  -MyChart is for non-urgent medication refills, scheduling requests, or other general questions.         [id number] Rev. 02/21/2022  Approved by Oncology Patient Education Committee

## 2022-09-21 ENCOUNTER — Ambulatory Visit: Admit: 2022-09-21 | Discharge: 2022-09-22 | Payer: PRIVATE HEALTH INSURANCE

## 2022-09-21 MED ADMIN — heparin, porcine (PF) 100 unit/mL injection 500 Units: 500 [IU] | INTRAVENOUS | @ 18:00:00 | Stop: 2022-09-22

## 2022-09-21 NOTE — Unmapped (Signed)
Pt to clinic for pump disconnect.  CADD pump infusions completed and stopped. Pump disconnected, port flushed and hep locked.  Pt left via wheelchair with spouse.

## 2022-09-22 DIAGNOSIS — C259 Malignant neoplasm of pancreas, unspecified: Principal | ICD-10-CM

## 2022-09-23 NOTE — Unmapped (Signed)
[facility] Refill Coordination Note    Specialty Medication(s) to be Shipped:   Hematology/Oncology: [name]    Other medication(s) to be shipped: No additional medications requested for fill at this time     Karrah Polcari Armentor, DOB: Apr 10, 1961  Phone: [phone] (home) [phone] (work)      All above HIPAA information was verified with patient.     Was a Nurse, learning disability used for this call? No    Completed refill call assessment today to schedule patient's medication shipment from the [facility] [phone]).  All relevant notes have been reviewed.     Specialty medication(s) and dose(s) confirmed: Regimen is correct and unchanged.   Changes to medications: Ninah reports no changes at this time.  Changes to insurance: No  New side effects reported not previously addressed with a pharmacist or physician: None reported  Questions for the pharmacist: No    Confirmed patient received a Conservation officer, historic buildings and a Surveyor, mining with first shipment. The patient will receive a drug information handout for each medication shipped and additional FDA Medication Guides as required.       DISEASE/MEDICATION-SPECIFIC INFORMATION        For patients on injectable medications: Patient currently has 0 doses left.  Next injection is scheduled for 10/06/22.    SPECIALTY MEDICATION ADHERENCE     Medication Adherence    Patient reported X missed doses in the last month: 0  Specialty Medication: Udenyca 6 mg/0.35mL  Patient is on additional specialty medications: No  Informant: patient                       Were doses missed due to medication being on hold? No    Udenyca 6mg /0.20mL: 0 days of medicine on hand       REFERRAL TO PHARMACIST     Referral to the pharmacist: Not needed      [facility]     Shipping address confirmed in Epic.     Delivery Scheduled: Yes, Expected medication delivery date: 10/02/22.     Medication will be delivered via Same Day Courier to the prescription address in Epic [place].    Wyatt Mage M Elisabeth Cara   [facility] Specialty Technician

## 2022-10-02 MED FILL — UDENYCA 6 MG/0.6 ML SUBCUTANEOUS SYRINGE: SUBCUTANEOUS | 28 days supply | Qty: 1.2 | Fill #1

## 2022-10-02 NOTE — Unmapped (Unsigned)
[facility]     CONSULTING PROVIDERS  [facility]  ____________________________________________________________________    CANCER HISTORY  Diagnosis: Pancreatic Adenocarcinoma, imaging concerning for metastatic disease to liver  1. 12/12/21: Presented with abdominal pain, weight loss found to have CBD obstruction with both pancreatic head and tail mass along with liver lesion. EUS/ERCP with CBD stent placement.  Biopsy of pancreatic tail mass with adenocarcinoma. Concern for metastatic disease with 1 cm liver lesion.  Also, local extension to left adrenal.   2. 5/4 start FOLFIRINOX (Cy 1 FOLFOX)    Molecular:  Somatic: KRAS, TP53, CCND3, TFEB, VEGFA, TMP 6.3 m/MB  Germline: negative   ____________________________________________________________________    ASSESSMENT  1. 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 Ground glass opacities on CT, asymptomatic and likely r/t to recent virus: remains asymptomatic  3. Cancer associated abdominal pain -controlled  3. Opioid Induced Constipation -controlled  4. Chemo neuropathy, grade 1, ctm  5. Chemotherapy induced neutropenia - managed with GCSF  6. Diarrhea, resolvign    RECOMMENDATIONS  1. mFOLIRINOX-->  FOLFIRI + GCSF support.   2. Oxycodone 5mg  every 4 hours prn   3. Continue Miralax and Senna    4. Scheduled dex x 3 days post chemo, zofran, compazine prn  5. RTC 2 weeks for treatment; 4 weeks for CT scan, labs, visit and treatment. [Pump disconnect at [place]]    DISCUSSION      Medical decision making based high complexity of cancer and treatment risk of complications and monitoring toxicity of chemotherapy.    ______________________________________________________________________  HISTORY     Norma Green is seen today at the [facility] for ongoing management regarding pancreatic cancer.            Doing pretty well though tired of getting chemo  Cramping abd pain for a few days after infusions. Not bad diarrhea.   Appetite good, minimal nausae  Residual neuropathy     MEDICAL HISTORY  1. HTN  2. Anxiety    Allergies   Allergen Reactions    Metronidazole Nausea And Vomiting     Medications reviewed in the EMR    SOCIAL HISTORY  Lives with family including husband. Smoker. No alcohol or drug use.  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 New grandbaby on the way 03/2022    PHYSICAL EXAM  There were no vitals filed for this visit.  GENERAL: well developed, well nourished, in no distress  PSYCH: full and appropriate range of affect with good insight and judgement  HEENT: NCAT, pupils equal, sclerae anicteric  PULM: normal resp rate  EXT: warm and well perfused, no edema  SKIN: No rashes      OBJECTIVE DATA  LABS  Labs reviewed in emr, ok for treatment    RADIOLOGY RESULTS   None today

## 2022-10-03 ENCOUNTER — Ambulatory Visit: Admit: 2022-10-03 | Discharge: 2022-10-04 | Payer: PRIVATE HEALTH INSURANCE

## 2022-10-03 ENCOUNTER — Other Ambulatory Visit: Admit: 2022-10-03 | Discharge: 2022-10-04 | Payer: PRIVATE HEALTH INSURANCE

## 2022-10-03 DIAGNOSIS — R110 Nausea: Principal | ICD-10-CM

## 2022-10-03 DIAGNOSIS — T451X5A Adverse effect of antineoplastic and immunosuppressive drugs, initial encounter: Principal | ICD-10-CM

## 2022-10-03 DIAGNOSIS — G620 Drug-induced polyneuropathy: Principal | ICD-10-CM

## 2022-10-03 DIAGNOSIS — D701 Agranulocytosis secondary to cancer chemotherapy: Principal | ICD-10-CM

## 2022-10-03 DIAGNOSIS — K521 Toxic gastroenteritis and colitis: Principal | ICD-10-CM

## 2022-10-03 DIAGNOSIS — C787 Secondary malignant neoplasm of liver and intrahepatic bile duct: Principal | ICD-10-CM

## 2022-10-03 DIAGNOSIS — C259 Malignant neoplasm of pancreas, unspecified: Principal | ICD-10-CM

## 2022-10-03 DIAGNOSIS — G893 Neoplasm related pain (acute) (chronic): Principal | ICD-10-CM

## 2022-10-03 DIAGNOSIS — Z09 Encounter for follow-up examination after completed treatment for conditions other than malignant neoplasm: Principal | ICD-10-CM

## 2022-10-03 LAB — CBC W/ AUTO DIFF
BASOPHILS ABSOLUTE COUNT: 0.1 10*9/L (ref 0.0–0.1)
BASOPHILS RELATIVE PERCENT: 1.1 %
EOSINOPHILS ABSOLUTE COUNT: 0.1 10*9/L (ref 0.0–0.5)
EOSINOPHILS RELATIVE PERCENT: 0.7 %
HEMATOCRIT: 30.9 % — ABNORMAL LOW (ref 34.0–44.0)
HEMOGLOBIN: 10.3 g/dL — ABNORMAL LOW (ref 11.3–14.9)
LYMPHOCYTES ABSOLUTE COUNT: 1.6 10*9/L (ref 1.1–3.6)
LYMPHOCYTES RELATIVE PERCENT: 17.5 %
MEAN CORPUSCULAR HEMOGLOBIN CONC: 33.3 g/dL (ref 32.0–36.0)
MEAN CORPUSCULAR HEMOGLOBIN: 28.2 pg (ref 25.9–32.4)
MEAN CORPUSCULAR VOLUME: 84.8 fL (ref 77.6–95.7)
MEAN PLATELET VOLUME: 7.2 fL (ref 6.8–10.7)
MONOCYTES ABSOLUTE COUNT: 0.7 10*9/L (ref 0.3–0.8)
MONOCYTES RELATIVE PERCENT: 7.5 %
NEUTROPHILS ABSOLUTE COUNT: 6.6 10*9/L (ref 1.8–7.8)
NEUTROPHILS RELATIVE PERCENT: 73.2 %
PLATELET COUNT: 174 10*9/L (ref 150–450)
RED BLOOD CELL COUNT: 3.64 10*12/L — ABNORMAL LOW (ref 3.95–5.13)
RED CELL DISTRIBUTION WIDTH: 16 % — ABNORMAL HIGH (ref 12.2–15.2)
WBC ADJUSTED: 8.9 10*9/L (ref 3.6–11.2)

## 2022-10-03 LAB — COMPREHENSIVE METABOLIC PANEL
ALBUMIN: 3.4 g/dL (ref 3.4–5.0)
ALKALINE PHOSPHATASE: 192 U/L — ABNORMAL HIGH (ref 46–116)
ALT (SGPT): 56 U/L — ABNORMAL HIGH (ref 10–49)
ANION GAP: 6 mmol/L (ref 5–14)
AST (SGOT): 29 U/L (ref <=34)
BILIRUBIN TOTAL: 0.3 mg/dL (ref 0.3–1.2)
BLOOD UREA NITROGEN: 18 mg/dL (ref 9–23)
BUN / CREAT RATIO: 20
CALCIUM: 9 mg/dL (ref 8.7–10.4)
CHLORIDE: 107 mmol/L (ref 98–107)
CO2: 29 mmol/L (ref 20.0–31.0)
CREATININE: 0.88 mg/dL
EGFR CKD-EPI (2021) FEMALE: 76 mL/min/{1.73_m2} (ref >=60)
GLUCOSE RANDOM: 157 mg/dL (ref 70–179)
POTASSIUM: 3.7 mmol/L (ref 3.5–5.1)
PROTEIN TOTAL: 6.6 g/dL (ref 5.7–8.2)
SODIUM: 142 mmol/L (ref 135–145)

## 2022-10-03 LAB — MAGNESIUM: MAGNESIUM: 1.8 mg/dL (ref 1.6–2.6)

## 2022-10-03 LAB — CANCER ANTIGEN 19-9: CA 19-9: 224.92 U/mL — ABNORMAL HIGH (ref 0–35)

## 2022-10-03 MED ORDER — LIDOCAINE-PRILOCAINE 2.5 %-2.5 % TOPICAL CREAM
3 refills | 0 days | Status: CP
Start: 2022-10-03 — End: 2023-10-02

## 2022-10-03 MED ADMIN — dextrose 5 % infusion: 100 mL/h | INTRAVENOUS | @ 16:00:00

## 2022-10-03 MED ADMIN — ondansetron (ZOFRAN) tablet 24 mg: 24 mg | ORAL | @ 15:00:00 | Stop: 2022-10-03

## 2022-10-03 MED ADMIN — atropine injection: .4 mg | INTRAVENOUS | @ 16:00:00

## 2022-10-03 MED ADMIN — leuCOVorin 736 mg in dextrose 5 % 50 mL IVPB: 400 mg/m2 | INTRAVENOUS | @ 16:00:00 | Stop: 2022-10-03

## 2022-10-03 MED ADMIN — fluorouracil (ADRUCIL) 4,400 mg in sodium chloride (NS) 0.9 % 46-hr infusion CADD: 2400 mg/m2 | INTRAVENOUS | @ 18:00:00

## 2022-10-03 MED ADMIN — dexAMETHasone (DECADRON) tablet 20 mg: 20 mg | ORAL | @ 15:00:00 | Stop: 2022-10-03

## 2022-10-03 MED ADMIN — irinotecan (Camptosar) 276 mg in dextrose 5 % 500 mL IVPB: 150 mg/m2 | INTRAVENOUS | @ 16:00:00 | Stop: 2022-10-03

## 2022-10-03 NOTE — Unmapped (Signed)
Lab on 10/03/2022   Component Date Value Ref Range Status    Sodium 10/03/2022 142  135 - 145 mmol/L Final    Potassium 10/03/2022 3.7  3.5 - 5.1 mmol/L Final    Chloride 10/03/2022 107  98 - 107 mmol/L Final    CO2 10/03/2022 29.0  20.0 - 31.0 mmol/L Final    Anion Gap 10/03/2022 6  5 - 14 mmol/L Final    BUN 10/03/2022 18  9 - 23 mg/dL Final    Creatinine 16/05/9603 0.88  0.55 - 1.02 mg/dL Final    BUN/Creatinine Ratio 10/03/2022 20   Final    eGFR CKD-EPI (2021) Female 10/03/2022 76  >=60 mL/min/1.71m2 Final    eGFR calculated with CKD-EPI 2021 equation in accordance with [employer] and [employer] of Nephrology Task Force recommendations.    Glucose 10/03/2022 157  70 - 179 mg/dL Final    Calcium 54/04/8118 9.0  8.7 - 10.4 mg/dL Final    Albumin 14/78/2956 3.4  3.4 - 5.0 g/dL Final    Total Protein 10/03/2022 6.6  5.7 - 8.2 g/dL Final    Total Bilirubin 10/03/2022 0.3  0.3 - 1.2 mg/dL Final    AST 21/30/8657 29  <=34 U/L Final    ALT 10/03/2022 56 (H)  10 - 49 U/L Final    Alkaline Phosphatase 10/03/2022 192 (H)  46 - 116 U/L Final    Magnesium 10/03/2022 1.8  1.6 - 2.6 mg/dL Final    CA 84-6 96/29/5284 224.92 (H)  0 - 35 U/mL Final    WBC 10/03/2022 8.9  3.6 - 11.2 10*9/L Final    RBC 10/03/2022 3.64 (L)  3.95 - 5.13 10*12/L Final    HGB 10/03/2022 10.3 (L)  11.3 - 14.9 g/dL Final    HCT 13/24/4010 30.9 (L)  34.0 - 44.0 % Final    MCV 10/03/2022 84.8  77.6 - 95.7 fL Final    MCH 10/03/2022 28.2  25.9 - 32.4 pg Final    MCHC 10/03/2022 33.3  32.0 - 36.0 g/dL Final    RDW 27/25/3664 16.0 (H)  12.2 - 15.2 % Final    MPV 10/03/2022 7.2  6.8 - 10.7 fL Final    Platelet 10/03/2022 174  150 - 450 10*9/L Final    Neutrophils % 10/03/2022 73.2  % Final    Lymphocytes % 10/03/2022 17.5  % Final    Monocytes % 10/03/2022 7.5  % Final    Eosinophils % 10/03/2022 0.7  % Final    Basophils % 10/03/2022 1.1  % Final    Absolute Neutrophils 10/03/2022 6.6  1.8 - 7.8 10*9/L Final    Absolute Lymphocytes 10/03/2022 1.6  1.1 - 3.6 10*9/L Final    Absolute Monocytes 10/03/2022 0.7  0.3 - 0.8 10*9/L Final    Absolute Eosinophils 10/03/2022 0.1  0.0 - 0.5 10*9/L Final    Absolute Basophils 10/03/2022 0.1  0.0 - 0.1 10*9/L Final

## 2022-10-03 NOTE — Unmapped (Signed)
4540: pt arrived to unit. Await meds from pharmacy  1245: pt tolerated infusion without difficulty. Maintian rcw huber needle for continuous infuision 74fu via cadd legacy pump. Clamps open, connections checked, and pump with green light flashing/running and no alarms

## 2022-10-04 ENCOUNTER — Encounter
Admit: 2022-10-04 | Discharge: 2022-10-08 | Disposition: A | Discharge status: Home / Self Care | Payer: MEDICAID | Attending: Certified Registered"

## 2022-10-04 ENCOUNTER — Encounter: Admit: 2022-10-04 | Discharge: 2022-10-08 | Payer: MEDICAID

## 2022-10-04 ENCOUNTER — Ambulatory Visit: Admit: 2022-10-04 | Discharge: 2022-10-08 | Payer: MEDICAID

## 2022-10-04 ENCOUNTER — Encounter
Admit: 2022-10-04 | Discharge: 2022-10-08 | Payer: PRIVATE HEALTH INSURANCE | Attending: Student in an Organized Health Care Education/Training Program

## 2022-10-04 ENCOUNTER — Encounter
Admit: 2022-10-04 | Discharge: 2022-10-08 | Disposition: A | Discharge status: Home / Self Care | Payer: MEDICAID | Attending: Student in an Organized Health Care Education/Training Program

## 2022-10-04 ENCOUNTER — Ambulatory Visit: Admit: 2022-10-04 | Discharge: 2022-10-08 | Disposition: A | Discharge status: Home / Self Care | Payer: MEDICAID

## 2022-10-04 LAB — COMPREHENSIVE METABOLIC PANEL
ALBUMIN: 3.4 g/dL (ref 3.4–5.0)
ALBUMIN: 2.9 g/dL — ABNORMAL LOW (ref 3.4–5.0)
ALKALINE PHOSPHATASE: 261 U/L — ABNORMAL HIGH (ref 46–116)
ALKALINE PHOSPHATASE: 200 U/L — ABNORMAL HIGH (ref 46–116)
ALT (SGPT): 270 U/L — ABNORMAL HIGH (ref 10–49)
ALT (SGPT): 208 U/L — ABNORMAL HIGH (ref 10–49)
ANION GAP: 8 mmol/L (ref 5–14)
ANION GAP: 4 mmol/L — ABNORMAL LOW (ref 5–14)
AST (SGOT): 279 U/L — ABNORMAL HIGH (ref <=34)
AST (SGOT): 141 U/L — ABNORMAL HIGH (ref <=34)
BILIRUBIN TOTAL: 0.7 mg/dL (ref 0.3–1.2)
BILIRUBIN TOTAL: 0.5 mg/dL (ref 0.3–1.2)
BLOOD UREA NITROGEN: 18 mg/dL (ref 9–23)
BLOOD UREA NITROGEN: 21 mg/dL (ref 9–23)
BUN / CREAT RATIO: 18
BUN / CREAT RATIO: 22
CALCIUM: 9.5 mg/dL (ref 8.7–10.4)
CALCIUM: 9 mg/dL (ref 8.7–10.4)
CHLORIDE: 106 mmol/L (ref 98–107)
CHLORIDE: 105 mmol/L (ref 98–107)
CO2: 25 mmol/L (ref 20.0–31.0)
CO2: 29 mmol/L (ref 20.0–31.0)
CREATININE: 1.01 mg/dL
CREATININE: 0.94 mg/dL
EGFR CKD-EPI (2021) FEMALE: 64 mL/min/{1.73_m2} (ref >=60)
EGFR CKD-EPI (2021) FEMALE: 70 mL/min/{1.73_m2} (ref >=60)
GLUCOSE RANDOM: 120 mg/dL (ref 70–179)
GLUCOSE RANDOM: 122 mg/dL (ref 70–179)
POTASSIUM: 4.2 mmol/L (ref 3.4–4.8)
POTASSIUM: 3.7 mmol/L (ref 3.4–4.8)
PROTEIN TOTAL: 6.6 g/dL (ref 5.7–8.2)
PROTEIN TOTAL: 5.6 g/dL — ABNORMAL LOW (ref 5.7–8.2)
SODIUM: 139 mmol/L (ref 135–145)
SODIUM: 138 mmol/L (ref 135–145)

## 2022-10-04 LAB — URINALYSIS WITH MICROSCOPY WITH CULTURE REFLEX
BACTERIA: NONE SEEN /HPF
BILIRUBIN UA: NEGATIVE
GLUCOSE UA: NEGATIVE
KETONES UA: NEGATIVE
LEUKOCYTE ESTERASE UA: NEGATIVE
NITRITE UA: NEGATIVE
PH UA: 6.5 (ref 5.0–9.0)
RBC UA: 1 /HPF (ref <=4)
SPECIFIC GRAVITY UA: 1.025 (ref 1.003–1.030)
SQUAMOUS EPITHELIAL: 1 /HPF (ref 0–5)
TRANSITIONAL EPITHELIAL: <1 /HPF (ref 0–2)
UROBILINOGEN UA: <2
WBC UA: 1 /HPF (ref 0–5)

## 2022-10-04 LAB — CBC W/ AUTO DIFF
BASOPHILS ABSOLUTE COUNT: 0.1 10*9/L (ref 0.0–0.1)
BASOPHILS RELATIVE PERCENT: 0.3 %
EOSINOPHILS ABSOLUTE COUNT: 0 10*9/L (ref 0.0–0.5)
EOSINOPHILS RELATIVE PERCENT: 0 %
HEMATOCRIT: 29.2 % — ABNORMAL LOW (ref 34.0–44.0)
HEMOGLOBIN: 9.4 g/dL — ABNORMAL LOW (ref 11.3–14.9)
LYMPHOCYTES ABSOLUTE COUNT: 1.2 10*9/L (ref 1.1–3.6)
LYMPHOCYTES RELATIVE PERCENT: 5.5 %
MEAN CORPUSCULAR HEMOGLOBIN CONC: 32.3 g/dL (ref 32.0–36.0)
MEAN CORPUSCULAR HEMOGLOBIN: 27.4 pg (ref 25.9–32.4)
MEAN CORPUSCULAR VOLUME: 84.8 fL (ref 77.6–95.7)
MEAN PLATELET VOLUME: 7.2 fL (ref 6.8–10.7)
MONOCYTES ABSOLUTE COUNT: 1.3 10*9/L — ABNORMAL HIGH (ref 0.3–0.8)
MONOCYTES RELATIVE PERCENT: 6.2 %
NEUTROPHILS ABSOLUTE COUNT: 19 10*9/L — ABNORMAL HIGH (ref 1.8–7.8)
NEUTROPHILS RELATIVE PERCENT: 88 %
PLATELET COUNT: 185 10*9/L (ref 150–450)
RED BLOOD CELL COUNT: 3.44 10*12/L — ABNORMAL LOW (ref 3.95–5.13)
RED CELL DISTRIBUTION WIDTH: 16.2 % — ABNORMAL HIGH (ref 12.2–15.2)
WBC ADJUSTED: 21.6 10*9/L — ABNORMAL HIGH (ref 3.6–11.2)

## 2022-10-04 LAB — LACTATE, VENOUS, WHOLE BLOOD: LACTATE BLOOD VENOUS: 1.6 mmol/L (ref 0.5–1.8)

## 2022-10-04 LAB — PROTIME-INR
INR: 1.13
PROTIME: 12.6 s (ref 9.9–12.6)

## 2022-10-04 LAB — HEMOGLOBIN AND HEMATOCRIT, BLOOD
HEMATOCRIT: 22.6 % — ABNORMAL LOW (ref 34.0–44.0)
HEMATOCRIT: 23 % — ABNORMAL LOW (ref 34.0–44.0)
HEMATOCRIT: 23.4 % — ABNORMAL LOW (ref 34.0–44.0)
HEMOGLOBIN: 7.4 g/dL — ABNORMAL LOW (ref 11.3–14.9)
HEMOGLOBIN: 7.5 g/dL — ABNORMAL LOW (ref 11.3–14.9)
HEMOGLOBIN: 8 g/dL — ABNORMAL LOW (ref 11.3–14.9)

## 2022-10-04 LAB — LIPASE: LIPASE: 29 U/L (ref 12–53)

## 2022-10-04 LAB — APTT
APTT: 27.6 s (ref 24.8–38.4)
HEPARIN CORRELATION: 0.2

## 2022-10-04 LAB — CK: CREATINE KINASE TOTAL: 36 U/L

## 2022-10-04 MED ADMIN — dexAMETHasone (DECADRON) tablet 8 mg: 8 mg | ORAL | @ 21:00:00 | Stop: 2022-10-04

## 2022-10-04 MED ADMIN — multivitamins, therapeutic with minerals tablet 1 tablet: 1 | ORAL | @ 21:00:00

## 2022-10-04 MED ADMIN — pantoprazole (Protonix) injection 80 mg: 80 mg | INTRAVENOUS | @ 11:00:00 | Stop: 2022-10-04

## 2022-10-04 MED ADMIN — piperacillin-tazobactam (ZOSYN) IVPB (premix) 4.5 g: 4.5 g | INTRAVENOUS | @ 23:00:00 | Stop: 2022-10-09

## 2022-10-04 MED ADMIN — cefepime (MAXIPIME) 2 g in sodium chloride 0.9 % (NS) 100 mL IVPB-MBP: 2 g | INTRAVENOUS | @ 11:00:00 | Stop: 2022-10-04

## 2022-10-04 MED ADMIN — lactated ringers bolus 1,000 mL: 1000 mL | INTRAVENOUS | @ 11:00:00 | Stop: 2022-10-04

## 2022-10-04 MED ADMIN — vancomycin (VANCOCIN) 1500 mg in sodium chloride (NS) 0.9 % 500 mL IVPB (premix): 1500 mg | INTRAVENOUS | @ 11:00:00 | Stop: 2022-10-04

## 2022-10-04 NOTE — Unmapped (Signed)
Bed: 70-D  Expected date:   Expected time:   Means of arrival:   Comments:  7 coming here

## 2022-10-04 NOTE — Unmapped (Signed)
Internal Medicine (MEDL) History & Physical    Assessment & Plan:   Norma Green is a 62 y.o. 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     Principal Problem:    Hematemesis  Active Problems:    Pancreatic adenocarcinoma (CMS-HCC)    Melena    Leukocytosis  Resolved Problems:    * No resolved hospital problems. *      Active Problems    C/f GIB I Hematemesis I Melena  Patient had chemotherapy 2/9.  Afterwards, experienced some nausea and vomiting.  She does typically gets nauseous and has some vomiting with her chemotherapy. However, she has not had hematemesis until last night. Husband reports it was about 1/4 of a cup.  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  However, low suspicion for variceal bleed or Mallory-Weiss tear.  Suspect episode of hematemesis secondary to peptic ulcer disease versus hemorrhagic gastritis in the setting of chemotherapy.  She is hemodynamically stable with benign abdominal exam.  Her abdominal pain resolved after having two episodes of melena, one last PM and one this AM. On admission, Hb at 9.4 from 10.3 yesterday. BUN:Cr ratio 22. Most recent Hb 7.4. Will obtain CTA for further evaluation and consideration of VIR vs GI Luminal consult.   -Trend H/H  -Type and screen  -IV Protonix BID  -NPO at midnight  -CTA A/P Gi Bleed      Abdominal Pain I N/V  Was previously noted to have mild LUQ tenderness without rebound or guarding. Abdominal pain improved after bowel movements.  Lipase to evaluate for pancreatitis is wnl on admission. No epigastric pain or hematuria. No recent alcohol use. UA notable for moderate blood, 1 RBC. Will obtain CK to rule out rhabdomyolysis given urinalysis.   -Compazine 10 mg q6h  -Oxycodone 5 mg q6h  -Follow up CK  -CMP    Leukocytosis  WBC 21.6 on admission. She received 20 mg of Decadron yesterday which could account for leukocytosis. However, given immunocompromised, infectious work up obtained in the ED. Now s/p Cefepime and Vancomycin dose. Also received 1L LR in the ED. Chest x-ray with no opacities/infiltrates noted. RPP negative. Lactate wnl. Blood cultures pending. Leukocytosis could also be 2/2 to bleeding given cortisol effect. However, suspect this may be 2/2 steroid use given drastic jump overnight. Will, however, maintain antibiotics overnight pending further work up.    -Zosyn 4.5 mg q8h  -Follow up blood cultures  -CBC    Transaminitis  LFTs elevated on admission. Suspect likely 2/2 chemotherapy. Will continue to monitor.   -Daily LFTs    Hx Pancreatic Adenocarcinoma with metastatic disease to liver, mFOLFIRINOX  Initially presented with abdominal pain, weight loss 11/2021, and found to have CBD obstruction with both pancreatic head and tail mass along with 1 cm liver lesion. Had EUS/ERCP with CBD stent placement.  Biopsy of pancreatic tail mass with adenocarcinoma.  12/26/21 started FOLFIRINOX (Cy 1 FOLFOX). Last chemotherapy treatment yesterday 2/9.  Will hold steroids overnight in the setting of potential GI bleed.  -Med Onc cs  -Decadron 8 mg on days 2, 3, and 4 of each 14-day chemotherapy cycle    HTN  Patient mildly hypertensive. Will hold antihypertensives in the setting of possible GIB.   -Hold amlodipine, hydrochlorothiazide, lisinopril, metoprolol    Chronic Problems  Constipation-on home miralax and  senna  Neutropenia-managed with GCSF      The patient's presentation is complicated by the following clinically significant conditions requiring additional evaluation and treatment: - Hypercoagulable state requiring additional attention to DVT prophylaxis and treatment  - Metastatic cancer POA requiring further investigation, treatment, or monitoring     Issues Impacting Complexity of Management:  -The patient is at high risk of complications from hematemesis, melena, transaminitis, leukocytosis    Medical Decision Making: Reviewed records from the following unique sources  Care Everywhere.    Checklist:  Diet: Regular Diet, NPO at MN  DVT PPx: Contraindicated - High Risk for Bleeding/Active Bleeding  Code Status: Full Code  Dispo: Patient appropriate for Inpatient based on expectation of ongoing need for hospitalization greater than two midnights based on severity of presentation/services including hematemesis, melena, leukocytosis    Team Contact Information:   Primary Team: Internal Medicine (MEDL)  Primary Resident: Antony Salmon, MD  Resident's Pager: [phone] (Gen MedL Intern - Alvester Morin)    Chief Concern:     Hematemesis    Subjective:   Norma Green is a 62 y.o. 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     History obtained by Patient and Spouse.     HPI:  Patient presented to the ED after noticing blood in her vomit and stool.  She reports 1 episode of vomit that was resolved with nausea medicine.  She reported 1 episode of dark stools at night and 1 this morning.  She was also having some abdominal pain that resolved after bowel movements.  She states that she typically gets nauseated and has vomited after chemotherapy. However, she never previously noted blood.  She denies any weakness or fatigue.  She was also noted to have a leukocytosis.  Though, she did receive decadron yesterday.  She reports having been exposed to her grandchild who was having fevers and upper respiratory symptoms.  She has had some runny nose recently.  However, she denies any fever, chills, body aches.  She thinks her grand child had a cold but was never tested for flu, RSV, or COVID.  She also occasionally has had diarrhea after chemotherapy for which she has previously taken Imodium.  She denies any diarrhea at this time.     In the ED, she had an infectious workup given her leukocytosis.  She was started on empiric antibiotics and IV fluids.  She was started on IV Protonix given the concern for possible GI bleed.  She denies any further episodes of melena or hematic emesis.  However, her most recent hemoglobin has noted to be 7.4.  She denies any hematuria.      Pertinent Surgical Hx  Past Surgical History:   Procedure Laterality Date    CHEMOTHERAPY  2023    Stomach    IR INSERT PORT AGE GREATER THAN 5 YRS  12/26/2021    IR INSERT PORT AGE GREATER THAN 5 YRS 12/26/2021 Alease Frame, NP IMG [facility]IR [facility]    OVARIAN CYST REMOVAL      PR COLONOSCOPY W/BIOPSY SINGLE/MULTIPLE  08/03/2013    Procedure: COLONOSCOPY, FLEXIBLE, PROXIMAL TO SPLENIC FLEXURE; WITH BIOPSY, SINGLE OR MULTIPLE;  Surgeon: Romero Belling, MD;  [facility]: [facility];  Service: Gastroenterology    PR ERCP STENT PLACEMENT BILIARY/PANCREATIC DUCT N/A 12/12/2021    Procedure: ENDOSCOPIC RETROGRADE CHOLANGIOPANCREATOGRAPHY (ERCP); WITH PLACEMENT OF ENDOSCOPIC STENT  INTO BILIARY OR PANCREATIC DUCT;  Surgeon: Vonda Antigua, MD;  Location: [facility];  Service: Gastroenterology    PR ERCP,SPHINCTEROTOMY N/A 12/12/2021    Procedure: ERCP; W/SPHINCTEROTOMY/PAPILLOTOMY;  Surgeon: Vonda Antigua, MD;  Location: [facility];  Service: Gastroenterology    PR UPGI ENDOSCOPY W/US FN BX N/A 12/12/2021    Procedure: UGI W/ TRANSENDOSCOPIC ULTRASOUND GUIDED INTRAMURAL/TRANSMURAL FINE NEEDLE ASPIRATION/BIOPSY(S), ESOPHAGUS;  Surgeon: Vonda Antigua, MD;  Location: [facility];  Service: Gastroenterology    TUBAL LIGATION Bilateral 08/25/1996      Pertinent Family Hx  Family History   Problem Relation Age of Onset    Hypertension Mother     Stroke Mother     Coronary artery disease Mother     Diabetes Father     Diabetes Sister     No Known Problems Daughter     No Known Problems Maternal Grandmother     No Known Problems Maternal Grandfather     No Known Problems Paternal Grandmother     No Known Problems Paternal Grandfather     No Known Problems Other     Breast cancer Neg Hx     Cancer Neg Hx     Colon cancer Neg Hx     Endometrial cancer Neg Hx     Ovarian cancer Neg Hx     BRCA 1/2 Neg Hx       Pertinent Social Hx   Patient states she quit smoking and alcohol in 11/2021.   Previously drank alcohol on weekends and smoked cigarettes daily.  Tobacco Use: High Risk (04/16/2022)    Patient History     Smoking Tobacco Use: Every Day     Smokeless Tobacco Use: Unknown     Passive Exposure: Not on file      Alcohol Use: Not on file       Allergies  Metronidazole    I reviewed the Medication List. The current list is Accurate  Prior to Admission medications    Medication Dose, Route, Frequency   amlodipine (NORVASC) 10 MG tablet 10 mg, Oral, Nightly   aspirin (ECOTRIN) 81 MG tablet 81 mg, Oral, Daily (standard)   dexAMETHasone (DECADRON) 4 MG tablet 8 mg, Oral, Daily (standard), Take only on days 2, 3, and 4 of each 14-day chemotherapy cycle.   heparin, porcine, PF, 100 unit/mL Syrg 5 mL, Intravenous, Every 14 days, For use while patient is on fluorouracil (5-FU) home infusion. <BR>Flush IV catheter with heparin 5 mL as directed after final saline flush per SASH method and as needed for line maintenance.   hydrochlorothiazide (HYDRODIURIL) 25 MG tablet TAKE ONE TABLET BY MOUTH ONCE DAILY   levoFLOXacin (LEVAQUIN) 750 MG tablet 750 mg, Oral, Daily (standard)   lidocaine-prilocaine (EMLA) 2.5-2.5 % cream Apply 30 minutes prior to port access.   lisinopril (PRINIVIL,ZESTRIL) 40 MG tablet 40 mg, Oral, Daily (standard)   loperamide (IMODIUM) 2 mg capsule If having diarrhea, take 2 capsules (4 mg) after the first loose stool, then 1 capsule every 2 hours until diarrhea free for 12 hours.   metoprolol succinate (TOPROL XL) 100 MG 24 hr tablet 100 mg, Oral, Daily (standard)   nicotine (NICODERM CQ) 7 mg/24 hr patch 1 patch, Transdermal, Every 24 hours   ondansetron (ZOFRAN) 8 MG tablet 8 mg, Oral, Every 8 hours PRN   oxyCODONE (ROXICODONE) 5 MG immediate release tablet 5 mg, Oral, Every 6 hours PRN   pegfilgrastim-cbqv (UDENYCA) 6 mg/0.6  mL injection Inject the contents of 1 syringe (6 mg) under the skin once on day 4 each chemotherapy cycle (24 hours after the completion of chemotherapy).   polyethylene glycol (GLYCOLAX) 17 gram/dose powder 17 g, Oral, Daily (standard)   prochlorperazine (COMPAZINE) 10 MG tablet 10 mg, Oral, Every 6 hours PRN   sertraline (ZOLOFT) 50 MG tablet 50 mg, Oral, Daily (standard)   sodium chloride (NS) 0.9 % injection 10 mL, Intravenous, Every 14 days, For use while patient is on fluorouracil (5-FU) home infusion. <BR>Flush IV catheter with normal saline 10 mL prior to and after infusion followed by heparin flush per SASH method and as needed for line maintenance.       Designated Chiropodist Maker:  Norma Green currently has decisional capacity for healthcare decision-making and is able to designate a surrogate healthcare decision maker. Norma Green designated healthcare decision maker(s) is/are Bonnye Fava (the patient's adult child) as denoted by stated patient preference.    Objective:   Physical Exam:  Temp:  [36.7 ??C (98.1 ??F)-36.8 ??C (98.2 ??F)] 36.7 ??C (98.1 ??F)  Heart Rate:  [60-90] 60  SpO2 Pulse:  [60-71] 60  Resp:  [16-21] 21  BP: (133-156)/(56-68) 156/56  SpO2:  [97 %-100 %] 97 %    Gen: NAD, converses   Eyes: Sclera anicteric, EOMI grossly normal   HENT: Atraumatic, normocephalic, tongue with black discolored spots laterally   Neck: Trachea midline  Heart: RRR  Lungs: CTAB, no crackles or wheezes  Abdomen: Soft, NTND  Extremities: No edema  Neuro: Grossly symmetric, non-focal    Skin:  No rashes, lesions on clothed exam  Psych: Alert, oriented

## 2022-10-04 NOTE — Unmapped (Addendum)
Hematology/Oncology History   Pancreatic adenocarcinoma (CMS-HCC)   12/13/2021 Initial Diagnosis    Pancreatic adenocarcinoma (CMS-HCC)     12/23/2021 -  Cancer Staged    Staging form: Exocrine Pancreas, AJCC 8th Edition  - Clinical: Stage IV (cM1) - Signed by Roni Bread, MD on 12/23/2021       12/26/2021 -  Chemotherapy    OP GI mFOLFIRINOX  oxaliplatin 85 mg/m2 IV on day 1, irinotecan 150 mg/m2 IV on day 1, leucovorin 400 mg/m2 IV on day 1, fluorouracil 2,400 mg/m2 CIVI over 46 hours on day 1, every 14 days       Norma Green is a 62 y.o. 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       GIB s/p EGD with MW tears I Hematemesis I Melena I Hematochezia   Patient had chemotherapy 2/9.  Afterwards, experienced some nausea and vomiting.  She does typically get nauseous and has some vomiting with her chemotherapy. However, she has not had hematemesis until night prior to admission. 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  On admission, Hb at 9.4 from 10.3 on the day prior with BUN:Cr ratio 22.  Patient had RRT 2/12 called for hematochezia.  Hb dropped to 5.4. Was taken to OR for EGD after 2 units of PRBC with repeat Hb improved. Was found to have two MW tears at GE junction, s/p 2 clips.  Of note, after procedure was found soiled in maroon blood during bed transfer. 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 She received 20 mg of Decadron day prior which could contribute to leukocytosis. However, given immunocompromised, infectious work up obtained in the ED. S/p Cefepime, Vanc, IVFLR in the ED. Chest x-ray with no opacities/infiltrates noted. RPP negative. Lipase wnl. Repeat lactate elevated during RRT 2/11, likely iso acute GIB, which improved after transfusion and IVF.  With increasing LFTs and alk phos, initial concern for metal stent occlusion/or seeding from biliary tract. CT A/P 2/11 with biliary stent occlusion and stent associated cholangitis. Blood cultures 1 out of 2 + E.coli iso stent associated cholangitis without urinary or other infectious source for which she was continued on Zosyn. ERCP ***.      Hx Pancreatic Adenocarcinoma with metastatic disease to liver  Initially presented with abdominal pain, weight loss 11/2021, and found to have CBD obstruction with both pancreatic head and tail mass along with 1 cm liver lesion. Had EUS/ERCP with CBD stent placement.  Biopsy of pancreatic tail mass with adenocarcinoma.  12/26/21 started FOLFIRINOX (Cy 1 FOLFOX). Last chemotherapy treatment 2/9. Med onc consulted and recommended to hold chemo pending GIB resolution ***      HTN  Patient mildly hypertensive. Held antihypertensives in the setting of possible GIB. Resumed *** home amlodipine, hydrochlorothiazide, lisinopril, metoprolol

## 2022-10-04 NOTE — Unmapped (Signed)
Received sign out from previous provider.    Patient Summary: Norma Green is a 62 y.o. 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 Patient is currently being infused with her chemotherapy through her right subclavian port and states that she typically gets nauseous and has some vomiting with her chemotherapy however has never had bloody vomit until this morning. Patient has a bag of bloody vomit at bedside containing approximately 10 cc of dark blood. Patient also endorses melena in her stools over the last 2 days. She has mild left upper quadrant pain, however has no other complaints at this time. She typically takes Compazine at home that has been helping with her nausea.   After further discussion with patient, she states that she takes 8 mg of Decadron on days 2 3 and 4 following her chemotherapy sessions. Considering patient's chemotherapy infusion started yesterday, she has not yet taken this round of Decadron dosing as she states that her last Decadron dosing was 2 weeks ago. Upon chart review, she does not typically have an elevated white count so I suspect that this is not related to her steroid regimen and possibly representing an occult infection so I will add on blood cultures, urinalysis, RPP and begin empiric antibiotic treatment with vancomycin, cefepime and 1L of LR. Will also start 80 mg IV Protonix. Will likely admit the patient for further infectious workup   Action List:   Admit    Updates  ED Course as of 10/04/22 1210   Sat Oct 04, 2022   0700 Patient was signed out to incoming resident.

## 2022-10-04 NOTE — Unmapped (Signed)
Vomited blood x1 last night. On chemo, last session yesterday

## 2022-10-04 NOTE — Unmapped (Signed)
[facility]  Emergency Department Provider Note     HPI     Norma Green is a very pleasant 62 y.o. 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  Patient is currently being infused with her chemotherapy through her right subclavian port and states that she typically gets nauseous and has some vomiting with her chemotherapy however has never had bloody vomit until this morning.  Patient has a bag of bloody vomit at bedside containing approximately 10 cc of dark blood.  Patient also endorses melena in her stools over the last 2 days.  She has mild left upper quadrant pain, however has no other complaints at this time.  She typically takes Compazine at home that has been helping with her nausea.      MDM     BP 133/68  - Pulse 84  - Temp 36.8 ??C (98.2 ??F) (Oral)  - Resp 16  - Wt 75.3 kg (166 lb)  - SpO2 100%  - BMI 26.00 kg/m??      Hemodynamically stable.  On exam, patient has mild left upper quadrant tenderness without rebound or guarding, otherwise benign physical exam further documented below.  Given patient clinical picture, suspect episode of hematemesis secondary to peptic ulcer disease versus hemorrhagic gastritis in the setting of chemotherapy.  Patient has no history of liver disease or esophageal varices that be suspicious for variceal bleed.  Given patient is hemodynamically stable with 1 episode of hematemesis, very low suspicion for aortoenteric fistula.  Obtain lipase to evaluate for pancreatitis. Diagnostic workup as below.  Patient has all her medications with her and states that she will take 10 mg of her Compazine as this helps with her nausea. Disposition pending further work-up. See progress notes below.     Orders Placed This Encounter   Procedures    Respiratory Pathogen Panel with COVID    Blood Culture #1    Blood Culture #2    XR Chest 2 views    CBC w/ Differential    Comprehensive Metabolic Panel    PT-INR    PTT    Lipase    Urinalysis with Microscopy with Culture Reflex    Lactate, Venous, Whole Blood    Monitor    Vital signs    Notify Provider    RN to notify pharmacy immediately that an antibiotic has been ordered from the Sepsis Order Set (if not available in the [facility] or if not yet verified)    Inpatient consult to Hematology    ECG 12 Lead    Insert peripheral IV       ED Course as of 10/04/22 1610   Sat Oct 04, 2022   0545 After further discussion with patient, she states that she takes 8 mg of Decadron on days 2 3 and 4 following her chemotherapy sessions.  Considering patient's chemotherapy infusion started yesterday, she has not yet taken this round of Decadron dosing as she states that her last Decadron dosing was 2 weeks ago.  Upon chart review, she does not typically have an elevated white count so I suspect that this is not related to her steroid regimen and possibly representing an occult infection so I will add on blood cultures, urinalysis, RPP and begin empiric antibiotic treatment with vancomycin, cefepime and 1L of LR.  Will also start 80  mg IV Protonix.  Will likely admit the patient for further infectious workup   0547 WBC(!): 21.6  Leukocytosis with left shift   0556 HGB(!): 9.4  Down from baseline of 11   0630 EKG showing NSR with a rate of 73 bpm, normal axis and normal intervals, normal R wave progression.  No evidence of acute ischemia, Brugada syndrome, hypertrophy, preexcitation syndrome.   0630 Patient was signed out to oncoming provider pending lab workup, hematology/oncology consult for disposition, likely admission       The case was discussed with the attending physician who is in agreement with the above assessment and plan.    - Any discussion of this patient's case/presentation between myself and consultants, admitting teams, or other team members has been documented above.  - Imaging and other studies, if performed, that were available during my care of the patient were independently reviewed and interpreted by me and considered in my medical decision making as documented above.  - External records reviewed: History of pancreatic cancer with metastasis to liver on active chemotherapy     ED Clinical Impression     Final diagnoses:   Hematemesis with nausea (Primary)        Physical Exam     Vitals:    10/04/22 0327 10/04/22 0333   BP:  133/68   Pulse: 90 84   Resp:  16   Temp:  36.8 ??C (98.2 ??F)   TempSrc:  Oral   SpO2: 100% 100%   Weight:  75.3 kg (166 lb)        Constitutional: In no acute distress.  Eyes: Conjunctivae are normal. EOMI.   HEENT: Normocephalic and atraumatic. Mucous membranes are moist.   Neck: Full active range of motion  Cardiovascular: See heart rate listed above.  Right subclavian port c/d/I.  No murmurs appreciated.  Normal skin perfusion.   Respiratory: See respiratory rate listed above.  Lungs are clear to auscultation bilaterally.  Speaking easily in full sentences  Gastrointestinal: Soft, non-distended, mild tenderness in left upper quadrant.  No guarding.  Musculoskeletal: No long bone deformities.   Neurologic: Normal speech and language. No gross focal neurologic deficits are appreciated.   Skin: Skin is warm, dry and intact.  Psychiatric: Mood and affect are normal.     Past History     PAST MEDICAL HISTORY/PAST SURGICAL HISTORY:   Past Medical History:   Diagnosis Date    Allergic rhinitis 12/30/2007    Hypertension     Murmur, heart     Sleep related leg cramps 11/03/2011    Tobacco use disorder 11/03/2011       Past Surgical History:   Procedure Laterality Date    CHEMOTHERAPY  2023    Stomach    IR INSERT PORT AGE GREATER THAN 5 YRS  12/26/2021    IR INSERT PORT AGE GREATER THAN 5 YRS 12/26/2021 Alease Frame, NP IM[facility]    OVARIAN CYST REMOVAL      PR COLONOSCOPY W/BIOPSY SINGLE/MULTIPLE  08/03/2013    Procedure: COLONOSCOPY, FLEXIBLE, PROXIMAL TO SPLENIC FLEXURE; WITH BIOPSY, SINGLE OR MULTIPLE;  Surgeon: Romero Belling, MD;  [facility]: [facility];  Service: Gastroenterology    PR ERCP STENT PLACEMENT BILIARY/PANCREATIC DUCT N/A 12/12/2021    Procedure: ENDOSCOPIC RETROGRADE CHOLANGIOPANCREATOGRAPHY (ERCP); WITH PLACEMENT OF ENDOSCOPIC STENT INTO BILIARY OR PANCREATIC DUCT;  Surgeon: Vonda Antigua, MD;  Location: [facility];  Service: Gastroenterology    PR ERCP,SPHINCTEROTOMY N/A 12/12/2021  Procedure: ERCP; W/SPHINCTEROTOMY/PAPILLOTOMY;  Surgeon: Vonda Antigua, MD;  Location: [facility];  Service: Gastroenterology    PR UPGI ENDOSCOPY W/US FN BX N/A 12/12/2021    Procedure: UGI W/ TRANSENDOSCOPIC ULTRASOUND GUIDED INTRAMURAL/TRANSMURAL FINE NEEDLE ASPIRATION/BIOPSY(S), ESOPHAGUS;  Surgeon: Vonda Antigua, MD;  Location: [facility];  Service: Gastroenterology    TUBAL LIGATION Bilateral 08/25/1996       MEDICATIONS:     Current Facility-Administered Medications:     cefepime (MAXIPIME) 2 g in sodium chloride 0.9 % (NS) 100 mL IVPB-MBP, 2 g, Intravenous, Once, Corinna Lines, DO, Last Rate: 200 mL/hr at 10/04/22 0613, 2 g at 10/04/22 1610    lactated ringers bolus 1,000 mL, 1,000 mL, Intravenous, Once, Corinna Lines, DO, 1,000 mL at 10/04/22 9604    vancomycin (VANCOCIN) 1500 mg in sodium chloride (NS) 0.9 % 500 mL IVPB (premix), 1,500 mg, Intravenous, Once, Corinna Lines, DO, Last Rate: 333.3 mL/hr at 10/04/22 0625, 1,500 mg at 10/04/22 5409    Current Outpatient Medications:     amlodipine (NORVASC) 10 MG tablet, Take 1 tablet (10 mg total) by mouth nightly., Disp: 30 tablet, Rfl: 11    aspirin (ECOTRIN) 81 MG tablet, Take 1 tablet (81 mg total) by mouth daily., Disp: , Rfl:     dexAMETHasone (DECADRON) 4 MG tablet, Take 2 tablets (8 mg total) by mouth daily. Take only on days 2, 3, and 4 of each 14-day chemotherapy cycle., Disp: 12 tablet, Rfl: 5 heparin, porcine, PF, 100 unit/mL Syrg, Infuse 5 mL into a venous catheter every fourteen (14) days. For use while patient is on fluorouracil (5-FU) home infusion.  Flush IV catheter with heparin 5 mL as directed after final saline flush per SASH method and as needed for line maintenance., Disp: 4 each, Rfl: PRN    hydrochlorothiazide (HYDRODIURIL) 25 MG tablet, TAKE ONE TABLET BY MOUTH ONCE DAILY, Disp: 30 tablet, Rfl: 0    levoFLOXacin (LEVAQUIN) 750 MG tablet, Take 1 tablet (750 mg total) by mouth daily., Disp: 3 tablet, Rfl: 0    lidocaine-prilocaine (EMLA) 2.5-2.5 % cream, Apply 30 minutes prior to port access., Disp: 5 g, Rfl: 3    lisinopril (PRINIVIL,ZESTRIL) 40 MG tablet, Take 1 tablet (40 mg total) by mouth daily., Disp: 30 tablet, Rfl: 11    loperamide (IMODIUM) 2 mg capsule, If having diarrhea, take 2 capsules (4 mg) after the first loose stool, then 1 capsule every 2 hours until diarrhea free for 12 hours., Disp: 60 capsule, Rfl: 11    metoprolol succinate (TOPROL XL) 100 MG 24 hr tablet, Take 1 tablet (100 mg total) by mouth daily., Disp: 30 tablet, Rfl: 11    nicotine (NICODERM CQ) 7 mg/24 hr patch, Place 1 patch on the skin daily., Disp: 28 patch, Rfl: 1    ondansetron (ZOFRAN) 8 MG tablet, Take 1 tablet (8 mg total) by mouth every eight (8) hours as needed for nausea., Disp: 30 tablet, Rfl: 2    oxyCODONE (ROXICODONE) 5 MG immediate release tablet, Take 1 tablet (5 mg total) by mouth every six (6) hours as needed., Disp: 90 tablet, Rfl: 0    pegfilgrastim-cbqv (UDENYCA) 6 mg/0.6 mL injection, Inject the contents of 1 syringe (6 mg) under the skin once on day 4 each chemotherapy cycle (24 hours after the completion of chemotherapy)., Disp: 1.2 mL, Rfl: 5    polyethylene glycol (GLYCOLAX) 17 gram/dose powder, Take 17 g by mouth daily., Disp: 255 g, Rfl:  11    prochlorperazine (COMPAZINE) 10 MG tablet, Take 1 tablet (10 mg total) by mouth every six (6) hours as needed (nausea)., Disp: 30 tablet, Rfl: 11    sertraline (ZOLOFT) 50 MG tablet, Take 1 tablet (50 mg total) by mouth daily., Disp: 30 tablet, Rfl: 11    sodium chloride (NS) 0.9 % injection, Infuse 10 mL into a venous catheter every fourteen (14) days. For use while patient is on fluorouracil (5-FU) home infusion.  Flush IV catheter with normal saline 10 mL prior to and after infusion followed by heparin flush per SASH method and as needed for line maintenance., Disp: 6 each, Rfl: PRN    ALLERGIES:   Metronidazole    SOCIAL HISTORY:   Social History     Tobacco Use    Smoking status: Every Day     Current packs/day: 0.50     Types: Cigarettes    Smokeless tobacco: Not on file    Tobacco comments:     Quit 2 weeks ago   Substance Use Topics    Alcohol use: No       FAMILY HISTORY:  Family History   Problem Relation Age of Onset    Hypertension Mother     Stroke Mother     Coronary artery disease Mother     Diabetes Father     Diabetes Sister     No Known Problems Daughter     No Known Problems Maternal Grandmother     No Known Problems Maternal Grandfather     No Known Problems Paternal Grandmother     No Known Problems Paternal Grandfather     No Known Problems Other     Breast cancer Neg Hx     Cancer Neg Hx     Colon cancer Neg Hx     Endometrial cancer Neg Hx     Ovarian cancer Neg Hx     BRCA 1/2 Neg Hx          Radiology     XR Chest 2 views    (Results Pending)        Laboratory Data     Lab Results   Component Value Date    WBC 21.6 (H) 10/04/2022    HGB 9.4 (L) 10/04/2022    HCT 29.2 (L) 10/04/2022    PLT 185 10/04/2022       Lab Results   Component Value Date    NA 139 10/04/2022    K 4.2 10/04/2022    CL 106 10/04/2022    CO2 25.0 10/04/2022    BUN 18 10/04/2022    CREATININE 1.01 10/04/2022    GLU 120 10/04/2022    CALCIUM 9.5 10/04/2022    MG 1.8 10/03/2022    PHOS 3.0 12/23/2021       Lab Results   Component Value Date    BILITOT 0.7 10/04/2022    BILIDIR 0.70 (H) 12/13/2021    PROT 6.6 10/04/2022    ALBUMIN 3.4 10/04/2022    ALT 270 (H) 10/04/2022    AST 279 (H) 10/04/2022    ALKPHOS 261 (H) 10/04/2022    GGT 30 06/16/2012       Lab Results   Component Value Date    INR 1.13 10/04/2022    APTT 27.6 10/04/2022       Kristopher Glee, DO  Emergency Medicine, PGY-1    Portions of this record have been created using Dragon dictation software. Dictation errors have been sought,  but may not have been identified and corrected.         Corinna Lines, DO  Resident  10/04/22 [phone]

## 2022-10-04 NOTE — Unmapped (Signed)
N/v starting tonight. Pt active chemo pt.

## 2022-10-04 NOTE — Unmapped (Signed)
Upcoming Appt:  Future Appointments   Date Time Provider Department Center   10/05/2022 12:00 PM ONCINF CHAIR 05 [facility]   10/17/2022  9:15 AM ONCINF CHAIR 06 [facility]   10/19/2022 12:30 PM ONCINF CHAIR 06 [facility]   10/31/2022  8:20 AM [facility] RM 1 [facility] [place] [facility]   10/31/2022  9:30 AM ADULT ONC LAB [facility]   10/31/2022 10:30 AM Triglianos, Tammy Ann, ANP ONCMULTI [facility]   10/31/2022 11:00 AM ONCINF CHAIR 40 [facility]   11/14/2022  7:30 AM ONCINF CHAIR 21 [facility]       Recent:   What is the date of your last related visit?  Infusion 10/03/22  Related acute medications Rx'd:  n/a  Home treatment tried:  none      Relevant:   Allergies: Metronidazole  Medications: Cancer regimine   Health History: pancreatic Ca  Weight: n/a      [place]/ Cancer patients only:  What was the date of your last cancer treatment (mm/dd/yy)?: 10/03/22  Was the treatment oral or infusion?: infusion  Are you currently on TVEC (yes/no)?: yes  Reason for Disposition   [1] Vomited blood AND [2] more than a few streaks of blood  (Exceptions: Few streaks that occurred only once, or swallowed blood from a nosebleed or cut in the mouth.)    Answer Assessment - Initial Assessment Questions  1. APPEARANCE of BLOOD: What does the blood look like? (e.g., pink, red blood, coffee-grounds)      Dark red chuncks  2. AMOUNT: How much blood was lost? (e.g., few streaks or strands, tablespoon, cup)      ~1/4-1/2 cup full  3. VOMITING BLOOD: How many times did it happen? or How many times in the past 24 hours?      x1  4. VOMITING WITHOUT BLOOD: How many times in the past 24 hours?       Vomited total of 1 in past 24hrs  5. ONSET: When did vomiting of blood begin?      Few min ago  6. CAUSE: What do you think is causing the vomiting of blood?      unsure  7. BLOOD THINNERS: Do you take any blood thinners? (e.g., Coumadin/warfarin, Pradaxa/dabigatran, aspirin) Denies blood thinners  8. DEHYDRATION: Are there any signs of dehydration? When was the last time you urinated? Do you feel dizzy?      Drinking well. Sl dry mouth. Denies dizziness/lightheaded. No SOB/chest pain  9. ABDOMEN PAIN: Are you having any abdomen pain? If Yes, ask: What does it feel like?  (e.g., crampy, dull, intermittent, constant)       Abd pain relieved by vomiting  10. DIARRHEA: Is there any diarrhea? If Yes, ask: How many times today?         Denies diarrhea. Unsure if last stool was black or tarry.  11. OTHER SYMPTOMS: Do you have any other symptoms? (e.g., fever, blood in stool)        Sl runny nose. Denies nose bleed/blood in nasal mucus. No mouth injury.  12. PREGNANCY: Is there any chance you are pregnant? When was your last menstrual period?        N/a    Protocols used: Vomiting Blood-A-AH

## 2022-10-05 LAB — CBC
HEMATOCRIT: 27 % — ABNORMAL LOW (ref 34.0–44.0)
HEMATOCRIT: 20.1 % — ABNORMAL LOW (ref 34.0–44.0)
HEMATOCRIT: 26.7 % — ABNORMAL LOW (ref 34.0–44.0)
HEMATOCRIT: 23.6 % — ABNORMAL LOW (ref 34.0–44.0)
HEMOGLOBIN: 8.8 g/dL — ABNORMAL LOW (ref 11.3–14.9)
HEMOGLOBIN: 6.5 g/dL — ABNORMAL LOW (ref 11.3–14.9)
HEMOGLOBIN: 9.2 g/dL — ABNORMAL LOW (ref 11.3–14.9)
HEMOGLOBIN: 8 g/dL — ABNORMAL LOW (ref 11.3–14.9)
MEAN CORPUSCULAR HEMOGLOBIN CONC: 32.4 g/dL (ref 32.0–36.0)
MEAN CORPUSCULAR HEMOGLOBIN CONC: 32.3 g/dL (ref 32.0–36.0)
MEAN CORPUSCULAR HEMOGLOBIN CONC: 34.4 g/dL (ref 32.0–36.0)
MEAN CORPUSCULAR HEMOGLOBIN CONC: 33.8 g/dL (ref 32.0–36.0)
MEAN CORPUSCULAR HEMOGLOBIN: 27.7 pg (ref 25.9–32.4)
MEAN CORPUSCULAR HEMOGLOBIN: 27.2 pg (ref 25.9–32.4)
MEAN CORPUSCULAR HEMOGLOBIN: 29.2 pg (ref 25.9–32.4)
MEAN CORPUSCULAR HEMOGLOBIN: 28.5 pg (ref 25.9–32.4)
MEAN CORPUSCULAR VOLUME: 85.3 fL (ref 77.6–95.7)
MEAN CORPUSCULAR VOLUME: 84.2 fL (ref 77.6–95.7)
MEAN CORPUSCULAR VOLUME: 85 fL (ref 77.6–95.7)
MEAN CORPUSCULAR VOLUME: 84.3 fL (ref 77.6–95.7)
MEAN PLATELET VOLUME: 7.4 fL (ref 6.8–10.7)
MEAN PLATELET VOLUME: 7.4 fL (ref 6.8–10.7)
MEAN PLATELET VOLUME: 7.3 fL (ref 6.8–10.7)
MEAN PLATELET VOLUME: 7.3 fL (ref 6.8–10.7)
PLATELET COUNT: 156 10*9/L (ref 150–450)
PLATELET COUNT: 206 10*9/L (ref 150–450)
PLATELET COUNT: 145 10*9/L — ABNORMAL LOW (ref 150–450)
PLATELET COUNT: 117 10*9/L — ABNORMAL LOW (ref 150–450)
RED BLOOD CELL COUNT: 3.16 10*12/L — ABNORMAL LOW (ref 3.95–5.13)
RED BLOOD CELL COUNT: 2.39 10*12/L — ABNORMAL LOW (ref 3.95–5.13)
RED BLOOD CELL COUNT: 3.14 10*12/L — ABNORMAL LOW (ref 3.95–5.13)
RED BLOOD CELL COUNT: 2.8 10*12/L — ABNORMAL LOW (ref 3.95–5.13)
RED CELL DISTRIBUTION WIDTH: 16.7 % — ABNORMAL HIGH (ref 12.2–15.2)
RED CELL DISTRIBUTION WIDTH: 16.5 % — ABNORMAL HIGH (ref 12.2–15.2)
RED CELL DISTRIBUTION WIDTH: 15.5 % — ABNORMAL HIGH (ref 12.2–15.2)
RED CELL DISTRIBUTION WIDTH: 15.3 % — ABNORMAL HIGH (ref 12.2–15.2)
WBC ADJUSTED: 13.9 10*9/L — ABNORMAL HIGH (ref 3.6–11.2)
WBC ADJUSTED: 22.2 10*9/L — ABNORMAL HIGH (ref 3.6–11.2)
WBC ADJUSTED: 17.4 10*9/L — ABNORMAL HIGH (ref 3.6–11.2)
WBC ADJUSTED: 13.7 10*9/L — ABNORMAL HIGH (ref 3.6–11.2)

## 2022-10-05 LAB — COMPREHENSIVE METABOLIC PANEL
ALBUMIN: 3.5 g/dL (ref 3.4–5.0)
ALBUMIN: 2.8 g/dL — ABNORMAL LOW (ref 3.4–5.0)
ALKALINE PHOSPHATASE: 261 U/L — ABNORMAL HIGH (ref 46–116)
ALKALINE PHOSPHATASE: 273 U/L — ABNORMAL HIGH (ref 46–116)
ALT (SGPT): 243 U/L — ABNORMAL HIGH (ref 10–49)
ALT (SGPT): 289 U/L — ABNORMAL HIGH (ref 10–49)
ANION GAP: 8 mmol/L (ref 5–14)
ANION GAP: 8 mmol/L (ref 5–14)
AST (SGOT): 159 U/L — ABNORMAL HIGH (ref <=34)
AST (SGOT): 192 U/L — ABNORMAL HIGH (ref <=34)
BILIRUBIN TOTAL: 1.2 mg/dL (ref 0.3–1.2)
BILIRUBIN TOTAL: 1.3 mg/dL — ABNORMAL HIGH (ref 0.3–1.2)
BLOOD UREA NITROGEN: 20 mg/dL (ref 9–23)
BLOOD UREA NITROGEN: 22 mg/dL (ref 9–23)
BUN / CREAT RATIO: 24
BUN / CREAT RATIO: 24
CALCIUM: 9.3 mg/dL (ref 8.7–10.4)
CALCIUM: 8.7 mg/dL (ref 8.7–10.4)
CHLORIDE: 105 mmol/L (ref 98–107)
CHLORIDE: 105 mmol/L (ref 98–107)
CO2: 25 mmol/L (ref 20.0–31.0)
CO2: 26 mmol/L (ref 20.0–31.0)
CREATININE: 0.85 mg/dL
CREATININE: 0.9 mg/dL
EGFR CKD-EPI (2021) FEMALE: 79 mL/min/{1.73_m2} (ref >=60)
EGFR CKD-EPI (2021) FEMALE: 74 mL/min/{1.73_m2} (ref >=60)
GLUCOSE RANDOM: 142 mg/dL (ref 70–179)
GLUCOSE RANDOM: 158 mg/dL (ref 70–179)
POTASSIUM: 4.1 mmol/L (ref 3.4–4.8)
POTASSIUM: 3.8 mmol/L (ref 3.4–4.8)
PROTEIN TOTAL: 6.9 g/dL (ref 5.7–8.2)
PROTEIN TOTAL: 5.7 g/dL (ref 5.7–8.2)
SODIUM: 138 mmol/L (ref 135–145)
SODIUM: 139 mmol/L (ref 135–145)

## 2022-10-05 LAB — HEMOGLOBIN AND HEMATOCRIT, BLOOD
HEMATOCRIT: 16.4 % — ABNORMAL LOW (ref 34.0–44.0)
HEMATOCRIT: 24.6 % — ABNORMAL LOW (ref 34.0–44.0)
HEMOGLOBIN: 5.4 g/dL — ABNORMAL LOW (ref 11.3–14.9)
HEMOGLOBIN: 8.6 g/dL — ABNORMAL LOW (ref 11.3–14.9)

## 2022-10-05 LAB — BLOOD GAS CRITICAL CARE PANEL, VENOUS
BASE EXCESS VENOUS: 2.3 — ABNORMAL HIGH (ref -2.0–2.0)
BASE EXCESS VENOUS: 1.4 (ref -2.0–2.0)
CALCIUM IONIZED VENOUS (MG/DL): 4.77 mg/dL (ref 4.40–5.40)
CALCIUM IONIZED VENOUS (MG/DL): 4.57 mg/dL (ref 4.40–5.40)
FIO2 VENOUS: 35
GLUCOSE WHOLE BLOOD: 162 mg/dL (ref 70–179)
GLUCOSE WHOLE BLOOD: 136 mg/dL (ref 70–179)
HCO3 VENOUS: 27 mmol/L (ref 22–27)
HCO3 VENOUS: 26 mmol/L (ref 22–27)
HEMOGLOBIN BLOOD GAS: 7 g/dL — ABNORMAL LOW (ref 12.00–16.00)
HEMOGLOBIN BLOOD GAS: 8 g/dL — ABNORMAL LOW
LACTATE BLOOD VENOUS: 4.5 mmol/L (ref 0.5–1.8)
LACTATE BLOOD VENOUS: 1.8 mmol/L (ref 0.5–1.8)
O2 SATURATION VENOUS: 36.1 % — ABNORMAL LOW (ref 40.0–85.0)
O2 SATURATION VENOUS: 99.3 % — ABNORMAL HIGH (ref 40.0–85.0)
PCO2 VENOUS: 42 mmHg (ref 40–60)
PCO2 VENOUS: 38 mmHg — ABNORMAL LOW (ref 40–60)
PH VENOUS: 7.41 (ref 7.32–7.43)
PH VENOUS: 7.44 — ABNORMAL HIGH (ref 7.32–7.43)
PO2 VENOUS: <31 mmHg (ref 30–55)
PO2 VENOUS: 144 mmHg — ABNORMAL HIGH (ref 30–55)
POTASSIUM WHOLE BLOOD: 3.7 mmol/L (ref 3.4–4.6)
POTASSIUM WHOLE BLOOD: 4.1 mmol/L (ref 3.4–4.6)
SODIUM WHOLE BLOOD: 140 mmol/L (ref 135–145)
SODIUM WHOLE BLOOD: 137 mmol/L (ref 135–145)

## 2022-10-05 LAB — PHOSPHORUS: PHOSPHORUS: 4.1 mg/dL (ref 2.4–5.1)

## 2022-10-05 LAB — FIBRINOGEN: FIBRINOGEN LEVEL: 288 mg/dL (ref 175–500)

## 2022-10-05 LAB — MAGNESIUM: MAGNESIUM: 2.2 mg/dL (ref 1.6–2.6)

## 2022-10-05 LAB — LACTATE, VENOUS, WHOLE BLOOD: LACTATE BLOOD VENOUS: 1.5 mmol/L (ref 0.5–1.8)

## 2022-10-05 MED ADMIN — piperacillin-tazobactam (ZOSYN) IVPB (premix) 4.5 g: 4.5 g | INTRAVENOUS | @ 14:00:00 | Stop: 2022-10-09

## 2022-10-05 MED ADMIN — Propofol (DIPRIVAN) injection: INTRAVENOUS | Stop: 2022-10-05

## 2022-10-05 MED ADMIN — piperacillin-tazobactam (ZOSYN) IVPB (premix) 4.5 g: 4.5 g | INTRAVENOUS | @ 22:00:00 | Stop: 2022-10-09

## 2022-10-05 MED ADMIN — alteplase (ACTIVase) injection small catheter clearance 1 mg: 1 mg | INTRAVENOUS | @ 06:00:00 | Stop: 2022-10-05

## 2022-10-05 MED ADMIN — multivitamins, therapeutic with minerals tablet 1 tablet: 1 | ORAL | @ 14:00:00

## 2022-10-05 MED ADMIN — prochlorperazine (COMPAZINE) injection 5 mg: 5 mg | INTRAVENOUS | @ 23:00:00

## 2022-10-05 MED ADMIN — lactated Ringers infusion: INTRAVENOUS | @ 23:00:00 | Stop: 2022-10-05

## 2022-10-05 MED ADMIN — alteplase (ACTIVase) injection small catheter clearance 2 mg: 2 mg | INTRAVENOUS | @ 05:00:00 | Stop: 2022-10-05

## 2022-10-05 MED ADMIN — midazolam (VERSED) injection: INTRAVENOUS | Stop: 2022-10-05

## 2022-10-05 MED ADMIN — piperacillin-tazobactam (ZOSYN) IVPB (premix) 4.5 g: 4.5 g | INTRAVENOUS | @ 06:00:00 | Stop: 2022-10-09

## 2022-10-05 MED ADMIN — iohexol (OMNIPAQUE) 350 mg iodine/mL solution 100 mL: 100 mL | INTRAVENOUS | @ 15:00:00 | Stop: 2022-10-05

## 2022-10-05 MED ADMIN — lactated ringers bolus 500 mL: 500 mL | INTRAVENOUS | @ 16:00:00 | Stop: 2022-10-05

## 2022-10-05 MED ADMIN — pantoprazole (Protonix) injection 40 mg: 40 mg | INTRAVENOUS | @ 14:00:00

## 2022-10-05 MED ADMIN — prochlorperazine (COMPAZINE) tablet 5 mg: 5 mg | ORAL | @ 02:00:00

## 2022-10-05 MED ADMIN — prochlorperazine (COMPAZINE) tablet 5 mg: 5 mg | ORAL | @ 12:00:00

## 2022-10-05 MED ADMIN — fentaNYL (PF) (SUBLIMAZE) injection: INTRAVENOUS | Stop: 2022-10-05

## 2022-10-05 MED ADMIN — pantoprazole (Protonix) injection 40 mg: 40 mg | INTRAVENOUS | @ 02:00:00

## 2022-10-05 MED ADMIN — ondansetron (ZOFRAN) injection: INTRAVENOUS | Stop: 2022-10-05

## 2022-10-05 MED ADMIN — dexAMETHasone (DECADRON) 4 mg/mL injection: INTRAVENOUS | Stop: 2022-10-05

## 2022-10-05 MED ADMIN — oxyCODONE (ROXICODONE) immediate release tablet 5 mg: 5 mg | ORAL | @ 12:00:00 | Stop: 2022-10-18

## 2022-10-05 MED ADMIN — succinylcholine (ANECTINE) injection: INTRAVENOUS | Stop: 2022-10-05

## 2022-10-05 MED ADMIN — lidocaine (XYLOCAINE) 20 mg/mL (2 %) injection: INTRAVENOUS | Stop: 2022-10-05

## 2022-10-05 NOTE — Unmapped (Signed)
Internal Medicine (MEDL) History & Physical    Assessment & Plan:   Norma Green is a 62 y.o. 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     Principal Problem:    Hematemesis  Active Problems:    Pancreatic adenocarcinoma (CMS-HCC)    Melena    Leukocytosis  Resolved Problems:    * No resolved hospital problems. *      Active Problems    C/f GIB I Hematemesis I Melena I Hematochezia I Occluded CBD stent  Patient had chemotherapy 2/9.  Afterwards, experienced some nausea and vomiting.  She does typically gets nauseous and has some vomiting with her chemotherapy. However, she has not had hematemesis until last night. Husband reports it was about 1/4 of a cup.  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  However, low suspicion for variceal bleed or Mallory-Weiss tear.  Suspect episode of hematemesis secondary to peptic ulcer disease versus hemorrhagic gastritis in the setting of chemotherapy.  She is hemodynamically stable with benign abdominal exam.  Her abdominal pain resolved after having two episodes of melena, one last PM and one this AM. On admission, Hb at 9.4 from 10.3 yesterday. BUN:Cr ratio 22. CT A/P 2/11 with findings suspicious for at least partial biliary stent occlusion and stent associated cholangitis.  Patient had RRT called for hematochezia.  Hb dropped to 6.5 with unclear if preceding 5.4 was accurate. Was taken to OR for EGD after 2 units of PRBC with repeat Hb 9.2.  -Trend H/H  -Type and screen  -IV Protonix BID  - Hold ASA, NSAIDs and subQ heparin    Abdominal Pain I N/V  Was previously noted to have mild LUQ tenderness without rebound or guarding. Abdominal pain improved after bowel movements.  Lipase to evaluate for pancreatitis is wnl on admission. No epigastric pain or hematuria. No recent alcohol use. UA notable for moderate blood, 1 RBC.   -Compazine 10 mg q6h  -Oxycodone 5 mg q6h  -CMP    Leukocytosis  WBC 21.6 on admission. She received 20 mg of Decadron yesterday which could account for leukocytosis. However, given immunocompromised, infectious work up obtained in the ED. Now s/p Cefepime and Vancomycin dose. Also received 1L LR in the ED. Chest x-ray with no opacities/infiltrates noted. RPP negative. Repeat lactate elevated during RRT, likely iso acute GIB.  -Zosyn 4.5 mg q8h  -BC GNR  -Trend lactate  -CBC    Transaminitis  LFTs elevated on admission. Ultrasound of the gallbladder did not show signs of clear cholecystitis of CBD dilation. With increasing LFTs and alk phos, concern for metal stent occlusion/or seeding from biliary tract   -Biliary consult  -Daily LFTs    Hx Pancreatic Adenocarcinoma with metastatic disease to liver  Initially presented with abdominal pain, weight loss 11/2021, and found to have CBD obstruction with both pancreatic head and tail mass along with 1 cm liver lesion. Had EUS/ERCP with CBD stent placement.  Biopsy of pancreatic tail mass with adenocarcinoma.  12/26/21 started FOLFIRINOX (Cy 1 FOLFOX). Last chemotherapy treatment 2/9.    -Med Onc cs  -Hold chemo pending GIB     HTN  Patient mildly hypertensive. Will hold antihypertensives in the setting of possible GIB.   -Hold amlodipine, hydrochlorothiazide, lisinopril, metoprolol    Chronic Problems  Constipation-on home miralax and senna  Neutropenia-managed  with GCSF      The patient's presentation is complicated by the following clinically significant conditions requiring additional evaluation and treatment: - Hypercoagulable state requiring additional attention to DVT prophylaxis and treatment  - Metastatic cancer POA requiring further investigation, treatment, or monitoring     Issues Impacting Complexity of Management:  -The patient is at high risk of complications from hematemesis, melena, transaminitis, leukocytosis    Medical Decision Making: Reviewed records from the following unique sources  Care Everywhere.    Checklist:  Diet: Regular Diet, NPO at MN  DVT PPx: Contraindicated - High Risk for Bleeding/Active Bleeding  Code Status: Full Code  Dispo: Patient appropriate for Inpatient based on expectation of ongoing need for hospitalization greater than two midnights based on severity of presentation/services including hematemesis, melena, leukocytosis    Team Contact Information:   Primary Team: Internal Medicine (MEDL)  Primary Resident: Antony Salmon, MD  Resident's Pager: [phone] (Gen MedL Intern - Alvester Morin)    Interval History:     Patient complained of generalized abdominal pain this morning and some nausea. She had a rapid response called this afternoon for hematochezia. She became lightheaded and laid on the ground after feeling dizzy. She did not fall or loss consciousness.       Objective:   Temp:  [36.3 ??C (97.3 ??F)-36.8 ??C (98.2 ??F)] 36.3 ??C (97.3 ??F)  Heart Rate:  [63-136] 79  Resp:  [18-20] 18  BP: (114-157)/(58-76) 152/60  SpO2:  [98 %-100 %] 100 %    Gen: NAD, converses   HENT: atraumatic, normocephalic  Heart: RRR  Lungs: CTAB, no crackles or wheezes  Abdomen: soft, generalized tenderness in all four quadrants  Extremities: No edema

## 2022-10-05 NOTE — Unmapped (Signed)
Received a call from primary team regarding clarification about IP chemo orders    Chart reviewed: 61-yo-female, with a diagnosis of Pancreatic adenocarcinoma with metastatic disease to liver - started on  FOLFIRINOX on 12/26/2021, transitioned to FOLFIRI with  Cycle 9 on 04/2022. Last treatment Cycle 20 started yesterday 02/09, completed Irinotecan during visit and went home on infusion of 5FU. Today presented to the ED nausea and vomiting after the chemo, with an episodes of hematemesis this morning and melena for the last 2 days.     Recommendations:  - Discontinue current 5FU infusion  - Management of GI bleeding per primary team.   - Please be aware the patient is receiving pegfilgrastim-cbqv (UDENYCA) 6 mg/0.6, 1 syringe (6 mg) on day 4 of each chemotherapy cycle (24 hours after the completion of chemotherapy). Although her chemotherapy was interrupted on day 2 this time, and now she has leukocytosis with WBC 21.6,  I would recommend close monitoring of her blood counts since her current leukocytosis can be transient secondary to recent Dexamethasone use and acute illness.  - I will communicate with the primary oncology team to update them about the current hospitalization and re-schedule outpatient appointments if appropriate.       Kosha Jaquith B. Danelle Earthly MD  Hematology-Oncology Fellow

## 2022-10-05 NOTE — Unmapped (Signed)
Pt is alert and oriented x4. Currently NPO. BM still bloody. Family at bedside. Pt went for CT of abdomen. Awaiting abd xray. Pt received IV abx and tolerated well. Remains on room air. Will continue to monitor.        Problem: Adult Inpatient Plan of Care  Goal: Plan of Care Review  Outcome: Progressing  Goal: Patient-Specific Goal (Individualized)  Outcome: Progressing  Goal: Absence of Hospital-Acquired Illness or Injury  Outcome: Progressing  Intervention: Identify and Manage Fall Risk  Recent Flowsheet Documentation  Taken 10/05/2022 0800 by Thomasene Ripple, RN  Safety Interventions:   family at bedside   chemotherapeutic agent precautions  Goal: Optimal Comfort and Wellbeing  Outcome: Progressing  Goal: Readiness for Transition of Care  Outcome: Progressing  Goal: Rounds/Family Conference  Outcome: Progressing     Problem: Self-Care Deficit  Goal: Improved Ability to Complete Activities of Daily Living  Outcome: Progressing

## 2022-10-05 NOTE — Unmapped (Signed)
Gastroenterology (Luminal) Consult Service   Initial Consultation         Assessment and Recommendations:   Norma Green is a 62 y.o. 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 The patient is seen in consultation at the request of Pixie Casino, MD ([facility] for concern for gastrointestinal bleeding.     Pt reported hematemesis x 1 and melena x 1 which began several hours after her chemotherapy on Fri. On my initial exam she was found to have maroon blood in rectal vault (no melena). Later this afternoon she had a maroon BM (media tab) with a 2g drop in Hgb to 6.5. CTAP also showed an occluded CBD stent. Initial plan was for EGD/ERCP at the same time but given ongoing bleeding will prioritize EGD today and determining timing for a separate ERCP. Differential diagnosis includes gastric and/or duodenal ulcers, esophagitis, gastritis/duodenitis, angiodysplasia, malignancy, or Dieulafoy's lesion.    Recommendations:   - NPO for EGD in OR today  - advanced team will see patient tomorrow to determine timing of ERCP   - Start/Maintain access with at least 2 large-bore peripheral IVs (16G or 18G) or large bore central line (i.e. 7Fr Cordis)  - Maintain active Type and Screen  - Trend H/H q6-8 hours and transfuse for Hgb < 7  - Hold ASA, NSAIDs and subQ heparin  - Start IV Protonix 40mg  BID    GI Pre-Procedure Checklist  Procedure: Upper Endoscopy in OR  Anticipated Date of Procedure: today  Anticoagulants/Antiplatelets: Not Applicable  Anesthesia Concerns:  hematemesis   Diet: Keep patient NPO    Issues Impacting Complexity of Management:  -None    Recommendations discussed with the patient's primary team. We will continue to follow along with you.    For questions, contact the on-call fellow for the Gastroenterology (Luminal) or Advanced GI Consult Service.    Subjective:   Patient reports she went to her chemotherapy session on Friday and later that evening had nausea and single episode of emesis.  Fianc?? at bedside reports that initially undigested food came out followed by maroon blood clots.  Reports that this wave of nausea is worse than usual following her chemotherapy sessions. Patient later had 1 episode of melena that night.  She denies NSAIDs and is not on AC.  On arrival to [facility] she had 1 additional episode of melena.     On arrival VSS, labs significant for WBC 21.6, hemoglobin 9.4 (baseline 10-11), T. bili 0.7, AST 279, ALT 270, AP 261.  Hgb initially down trended to a low of 7.4 and has been uptrending since - this morning Hgb 8.8.  Morning labs notable for increasing T. bili 1.2 today and decreasing transaminases AST 159, ALT 243, AP 261 today. Blood cultures +1/2 for GNR.  She is on Zosyn.     Initial plan was for EGD/ERCP on Mon however this afternoon patient had 1 episode of hematochezia (pic media tab) for which an RRT was called. Hgb was found to be 6.5, down from 8.8 previously (unclear whether preceding Hgb 5.4 was accurate).         Objective:   Temp:  [36.6 ??C (97.9 ??F)-37.1 ??C (98.7 ??F)] 36.6 ??C (97.9 ??F)  Heart Rate:  [63-103] 103  Resp:  [19-20] 20  BP: (124-152)/(61-71) 152/71  SpO2:  [98 %-100 %] 100 %  Gen: WDWN female in NAD, answers questions appropriately  Abdomen: Soft, NTND, no rebound/guarding, no hepatosplenomegaly. Perianal exam: Normal perianal area, no rash, no external hemorrhoids, no external fissure visualized. Digital exam: Normal sphincter tone. No significant tenderness at sphincter level. Digital exam negative for mass, reveals maroon blood in vault, soft stool palpated.  Extremities: No edema in the BLEs    Pertinent Labs/Studies:  -I have reviewed the patient's labs from 2/10 which show stable Hgb

## 2022-10-06 LAB — BASIC METABOLIC PANEL
ANION GAP: 3 mmol/L — ABNORMAL LOW (ref 5–14)
BLOOD UREA NITROGEN: 15 mg/dL (ref 9–23)
BUN / CREAT RATIO: 17
CALCIUM: 8.2 mg/dL — ABNORMAL LOW (ref 8.7–10.4)
CHLORIDE: 108 mmol/L — ABNORMAL HIGH (ref 98–107)
CO2: 28 mmol/L (ref 20.0–31.0)
CREATININE: 0.88 mg/dL
EGFR CKD-EPI (2021) FEMALE: 76 mL/min/{1.73_m2} (ref >=60)
GLUCOSE RANDOM: 130 mg/dL (ref 70–179)
POTASSIUM: 4.2 mmol/L (ref 3.4–4.8)
SODIUM: 139 mmol/L (ref 135–145)

## 2022-10-06 LAB — CBC W/ AUTO DIFF
BASOPHILS ABSOLUTE COUNT: 0 10*9/L (ref 0.0–0.1)
BASOPHILS RELATIVE PERCENT: 0.1 %
EOSINOPHILS ABSOLUTE COUNT: 0 10*9/L (ref 0.0–0.5)
EOSINOPHILS RELATIVE PERCENT: 0 %
HEMATOCRIT: 21.5 % — ABNORMAL LOW (ref 34.0–44.0)
HEMOGLOBIN: 7.3 g/dL — ABNORMAL LOW (ref 11.3–14.9)
LYMPHOCYTES ABSOLUTE COUNT: 1 10*9/L — ABNORMAL LOW (ref 1.1–3.6)
LYMPHOCYTES RELATIVE PERCENT: 8.5 %
MEAN CORPUSCULAR HEMOGLOBIN CONC: 34.1 g/dL (ref 32.0–36.0)
MEAN CORPUSCULAR HEMOGLOBIN: 29 pg (ref 25.9–32.4)
MEAN CORPUSCULAR VOLUME: 85.1 fL (ref 77.6–95.7)
MEAN PLATELET VOLUME: 7.9 fL (ref 6.8–10.7)
MONOCYTES ABSOLUTE COUNT: 0.1 10*9/L — ABNORMAL LOW (ref 0.3–0.8)
MONOCYTES RELATIVE PERCENT: 0.9 %
NEUTROPHILS ABSOLUTE COUNT: 10.4 10*9/L — ABNORMAL HIGH (ref 1.8–7.8)
NEUTROPHILS RELATIVE PERCENT: 90.5 %
PLATELET COUNT: 118 10*9/L — ABNORMAL LOW (ref 150–450)
RED BLOOD CELL COUNT: 2.53 10*12/L — ABNORMAL LOW (ref 3.95–5.13)
RED CELL DISTRIBUTION WIDTH: 15.1 % (ref 12.2–15.2)
WBC ADJUSTED: 11.5 10*9/L — ABNORMAL HIGH (ref 3.6–11.2)

## 2022-10-06 LAB — HEPATIC FUNCTION PANEL
ALBUMIN: 2.5 g/dL — ABNORMAL LOW (ref 3.4–5.0)
ALKALINE PHOSPHATASE: 201 U/L — ABNORMAL HIGH (ref 46–116)
ALT (SGPT): 193 U/L — ABNORMAL HIGH (ref 10–49)
AST (SGOT): 81 U/L — ABNORMAL HIGH (ref <=34)
BILIRUBIN DIRECT: 0.4 mg/dL — ABNORMAL HIGH (ref 0.00–0.30)
BILIRUBIN TOTAL: 1 mg/dL (ref 0.3–1.2)
PROTEIN TOTAL: 4.9 g/dL — ABNORMAL LOW (ref 5.7–8.2)

## 2022-10-06 LAB — HEMOGLOBIN AND HEMATOCRIT, BLOOD
HEMATOCRIT: 21.7 % — ABNORMAL LOW (ref 34.0–44.0)
HEMATOCRIT: 22.2 % — ABNORMAL LOW (ref 34.0–44.0)
HEMATOCRIT: 22.3 % — ABNORMAL LOW (ref 34.0–44.0)
HEMATOCRIT: 22.3 % — ABNORMAL LOW (ref 34.0–44.0)
HEMOGLOBIN: 7.5 g/dL — ABNORMAL LOW (ref 11.3–14.9)
HEMOGLOBIN: 7.7 g/dL — ABNORMAL LOW (ref 11.3–14.9)
HEMOGLOBIN: 7.7 g/dL — ABNORMAL LOW (ref 11.3–14.9)
HEMOGLOBIN: 7.7 g/dL — ABNORMAL LOW (ref 11.3–14.9)

## 2022-10-06 LAB — MAGNESIUM: MAGNESIUM: 2 mg/dL (ref 1.6–2.6)

## 2022-10-06 LAB — PHOSPHORUS: PHOSPHORUS: 3.7 mg/dL (ref 2.4–5.1)

## 2022-10-06 MED ADMIN — Propofol (DIPRIVAN) injection: INTRAVENOUS | @ 01:00:00 | Stop: 2022-10-05

## 2022-10-06 MED ADMIN — EPINEPHrine (ADRENALIN) injection solution: INTRACAMERAL | @ 01:00:00 | Stop: 2022-10-05

## 2022-10-06 MED ADMIN — midazolam (VERSED) injection: INTRAVENOUS | @ 21:00:00 | Stop: 2022-10-06

## 2022-10-06 MED ADMIN — piperacillin-tazobactam (ZOSYN) IVPB (premix) 4.5 g: 4.5 g | INTRAVENOUS | @ 05:00:00 | Stop: 2022-10-09

## 2022-10-06 MED ADMIN — piperacillin-tazobactam (ZOSYN) IVPB (premix) 4.5 g: 4.5 g | INTRAVENOUS | Stop: 2022-10-09

## 2022-10-06 MED ADMIN — succinylcholine chloride (ANECTINE) injection: INTRAVENOUS | @ 21:00:00 | Stop: 2022-10-06

## 2022-10-06 MED ADMIN — Propofol (DIPRIVAN) injection: INTRAVENOUS | @ 21:00:00 | Stop: 2022-10-06

## 2022-10-06 MED ADMIN — lidocaine (XYLOCAINE) 20 mg/mL (2 %) injection: INTRAVENOUS | @ 21:00:00 | Stop: 2022-10-06

## 2022-10-06 MED ADMIN — ondansetron (ZOFRAN) injection: INTRAVENOUS | @ 22:00:00 | Stop: 2022-10-06

## 2022-10-06 MED ADMIN — fentaNYL (PF) (SUBLIMAZE) injection: INTRAVENOUS | @ 21:00:00 | Stop: 2022-10-06

## 2022-10-06 MED ADMIN — simethicone (MYLICON) oral drops: Stop: 2022-10-05

## 2022-10-06 MED ADMIN — pantoprazole (Protonix) injection 40 mg: 40 mg | INTRAVENOUS | @ 16:00:00

## 2022-10-06 MED ADMIN — Propofol (DIPRIVAN) injection: INTRAVENOUS | Stop: 2022-10-05

## 2022-10-06 MED ADMIN — sterile water irrigation solution: Stop: 2022-10-05

## 2022-10-06 MED ADMIN — dexAMETHasone (DECADRON) 4 mg/mL injection: INTRAVENOUS | @ 21:00:00 | Stop: 2022-10-06

## 2022-10-06 MED ADMIN — piperacillin-tazobactam (ZOSYN) IVPB (premix) 4.5 g: 4.5 g | INTRAVENOUS | @ 16:00:00 | Stop: 2022-10-09

## 2022-10-06 MED ADMIN — dexmedeTOMIDine (Precedex) injection: INTRAVENOUS | @ 21:00:00 | Stop: 2022-10-06

## 2022-10-06 MED ADMIN — multivitamins, therapeutic with minerals tablet 1 tablet: 1 | ORAL | @ 16:00:00

## 2022-10-06 MED ADMIN — lactated Ringers infusion: 10 mL/h | INTRAVENOUS | @ 21:00:00

## 2022-10-06 NOTE — Unmapped (Signed)
Gastroenterology (Luminal) Consult Service   Progress Note         Assessment and Recommendations:   Norma Green is a 62 y.o. female with a PMHx of metastatic pancreatic adenocarcinoma c/b malignant biliary obstruction s/p ERCP with CBD stenting on FOLFIRINOX, hypertension, anxiety who presented to [facility] with hematemesis and melena. The patient is seen in consultation at the request of Pixie Casino, MD ([facility] for concern for gastrointestinal bleeding.     Pt reported hematemesis x 1 and melena x 1 which began several hours after her chemotherapy on Fri. On initial GI exam she was found to have maroon blood in rectal vault (no melena). Later on 2/11 she had a maroon BM (media tab) with a 2g drop in Hgb to 6.5. CTAP also showed an occluded CBD stent. Initial plan was for EGD/ERCP at the same time but given ongoing bleeding EGD was performed on 2/11, with evidence of 2 small MW tears. After the procedure patient was noted to be soiled in maroon blood.     Patient has remained hemodynamically stable. Hgb stable at 7.7, without need for additional transfusion.     Given extent of blood loss from upper source, we would expect some ongoing melena post procedure. Would not recommend proceeding with colonoscopy at this time.         Issues Impacting Complexity of Management:  -None    Recommendations discussed with the patient's primary team. We will sign-off at this time, please re-contact if additional questions or a new need for consultation arises.    For questions, contact the on-call fellow for the Gastroenterology (Luminal) Consult Service.    Subjective:     EGD 2/11 with 2 small MW tears. After procedure, patient noted to be soiled in maroon blood.   Hgb 6 -> 7.5 -> 7.3 -> 7.7   Last pRBC 2/11 (1 unit)     She has been HDS since her procedure. She continues to have maroon/black stools overnight.          Objective:   Temp:  [36.3 ??C (97.3 ??F)-36.9 ??C (98.4 ??F)] 36.8 ??C (98.2 ??F)  Heart Rate: [69-136] 71  SpO2 Pulse:  [69-98] 71  Resp:  [14-25] 17  BP: (98-184)/(44-76) 116/54  SpO2:  [98 %-100 %] 100 %    Gen: WDWN female in NAD, answers questions appropriately  Abdomen: Soft, NTND, no rebound/guarding; sitting in dark black / maroon stool   Extremities: No edema in the BLEs    Pertinent Labs/Studies:  Lab Results   Component Value Date    WBC 11.5 (H) 10/06/2022    RBC 2.53 (L) 10/06/2022    HGB 7.7 (L) 10/06/2022    HCT 22.3 (L) 10/06/2022    MCV 85.1 10/06/2022    MCH 29.0 10/06/2022    MCHC 34.1 10/06/2022    RDW 15.1 10/06/2022    PLT 118 (L) 10/06/2022    MPV 7.9 10/06/2022     Lab Results   Component Value Date    CREATININE 0.88 10/06/2022

## 2022-10-06 NOTE — Unmapped (Addendum)
Gastroenterology (Luminal) Consult Service   Treatment Plan         Recommendations:   EGD this evening with two small MW tears at GE junction - 1 clip was placed c/b bleeding therefore epi was injected into the surrounding area with resolution. Given significant oozing, queried underlying varix, but none overtly evident. Second clip placed. No evidence of old blood was seen throughout the exam which was complete to D4. Patient was extubated after the procedure and upon transfer to hospital bed was found soiled in maroon blood.     Recommendations:   - stat CBC now  - if develops bleeding associated with hemodynamic instability, obtain stat CTA   - luminal team will continue to follow for consideration of colonoscopy but would not prep patient tonight   - advanced team will follow as an ERCP is still needed for her occluded CBD stent    Thank you for involving [place] in the care of your patient. We will continue to follow along with you.     For questions, contact the on-call fellow for the Gastroenterology (Luminal) Consult Service.

## 2022-10-06 NOTE — Unmapped (Signed)
Biliary & Advanced Endoscopy Consult Service   Initial Consultation         Assessment and Recommendations:   Norma Green is a 62 y.o. female with a PMHx of metastatic pancreatic adenocarcinoma c/b malignant biliary obstruction s/p ERCP with CBD stenting on FOLFIRINOX, hypertension, anxiety who presented to [facility] with hematemesis and melena. The patient is seen in consultation at the request of Suzzanne Cloud, MD ([facility] for occluded CBD Stent.    #Stent Occlusion - EColi Bacteremia:  Patient found to have CBD dilation to 1.9cm and concern for stent occlusion on CT AP. Patient has remained afebrile and HDS though WBC elevated to 22 on admission and 1 out of 2 blood culture growing E Coli without urinary or other infectious source. Given patient now stable from UGIB standpoint, will plan for ERCP today (2/12) to address CBD stent occlusion. Agree with antibiotic coverage.     - ERCP 2/12  - Keep NPO  - Antibiosis per primary team     GI Pre-Procedure Checklist  Procedure: ERCP  Anticipated Date of Procedure: 2/12  Anticoagulants/Antiplatelets: Hold DVT prophylaxis day of procedure  Anesthesia Concerns: None  Diet: Keep patient NPO    Issues Impacting Complexity of Management:  -Discussed the risks/benefits of ERCP, a major procedure, with the patient/family including, but not limited to, bleeding, infection, perforation, and pancreatitis. The patient/family consented to the procedure.    Recommendations discussed with the patient's primary team. We will continue to follow along with you.    For questions, contact the on-call fellow for the Biliary & Advanced Endoscopy Consult Service.    Subjective:     Patient admitted for hematemesis and melena with Hgb 10.3 with 2 point Hgb decrease to 8.0 on 2.10 and further decreased to 6.5 2/11 for which she underwent EGD 2/11 with two small Mallory-Weiss tears at GE junction. 1 clip was placed c/b bleeding therefore epi was injected into the surrounding area with resolution. Second clip then placed. This morning with resolving/improving melena. No maroon colored stool. No BRBPR. No hematemesis.     She has remained afebrile during this admission and HDS. WBC peaked at 22.2 and now with decrease to 11.5. T bili peaked at 1.3 and 1.0 today. AST 81 ALT 193 ALP 201. Blood culture with Ecoli. No UTI. CT AP 2/11 showing proximal CBD dilation (1.9cm) surrounding the biliary stent with wall enhancement and heterogenous intraluminal attenuation and concern for CBD stent occlusion.     CBD stent placed in 11/2021     Objective:   Temp:  [36.3 ??C (97.3 ??F)-36.9 ??C (98.4 ??F)] 36.9 ??C (98.4 ??F)  Heart Rate:  [64-136] 64  SpO2 Pulse:  [64-98] 64  Resp:  [14-25] 17  BP: (98-184)/(44-76) 129/55  SpO2:  [98 %-100 %] 99 %    Gen: NAD, answers questions appropriately  Abdomen: Soft, NTND, no rebound/guarding  Extremities: No edema in the BLEs    Pertinent Labs/Studies:  EGD 10/05/22:  - Two Mallory-Weiss tears. Injected. Clips (MR conditional) were placed. Clip manufacturer: [employer].  - Small hiatal hernia.  - Normal stomach.  - Metal stent in the duodenum.  - Normal examined duodenum.  - No specimens collected.    EUS/ERCP 12/12/21:  - A single severe biliary stricture was found in the lower third of the main bile duct. The stricture was malignant appearing.  - The upper third of the main bile duct was severely dilated, acquired.  - A biliary sphincterotomy  was performed.  - One fully covered metal stent was placed into the common bile duct  A: Pancreatic tail mass, biopsy  - Invasive adenocarcinoma    CT AP w Contrast 10/05/22:  Proximal common bile duct dilatation surrounding the biliary stent with wall enhancement and heterogenous intraluminal attenuation. Hypoattenuating material within the biliary stent with at least partial occlusion distally and near complete/complete occlusion proximally. There is also increased pneumobilia and intrahepatic biliary ductal dilation. 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      Stable size of the lesion within hepatic segment VII with interval increased central hypodensity. This may represent a metastatic focus and attention on follow-up is recommended.      Stable size/appearance of the pancreatic body/tail lesion and the left adrenal nodule.

## 2022-10-06 NOTE — Unmapped (Deleted)
Internal Medicine (MEDL) History & Physical    Assessment & Plan:   Norma Green is a 62 y.o. female whose presentation is complicated by HTN, pancreatic adenocarcinoma with metastasis to liver currently on chemotherapy s/p CBD stenting that presented to [facility] with hematemesis and melena, found to have MW tear and occluded CBD stent.     Principal Problem:    Hematemesis  Active Problems:    Pancreatic adenocarcinoma (CMS-HCC)    Melena    Leukocytosis  Resolved Problems:    * No resolved hospital problems. *      Active Problems    GIB s/p EGD with MW tears I Hematemesis I Melena I Hematochezia   Patient had chemotherapy 2/9.  Afterwards, experienced some nausea and vomiting.  She does typically get nauseous and has some vomiting with her chemotherapy. However, she has not had hematemesis until night prior to admission. 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  On admission, Hb at 9.4 from 10.3 on the day prior with BUN:Cr ratio 22.  Patient had RRT 2/12 called for hematochezia.  Hb dropped to 5.4. Was taken to OR for EGD after 2 units of PRBC with repeat Hb improved. Was found to have two MW tears at GE junction, s/p 2 clips.  Of note, after procedure was found soiled in maroon blood during bed transfer.   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 She received 20 mg of Decadron day prior which could contribute to leukocytosis. However, given immunocompromised, infectious work up obtained in the ED. S/p Cefepime, Vanc, IVFLR in the ED. Chest x-ray with no opacities/infiltrates noted. RPP negative. Lipase wnl. Repeat lactate elevated during RRT 2/11, likely iso acute GIB, which improved after transfusion and IVF.  With increasing LFTs and alk phos, initial concern for metal stent occlusion/or seeding from biliary tract. CT A/P 2/11 with biliary stent occlusion and stent associated cholangitis. Blood cultures 1 out of 2 + E.coli iso stent associated cholangitis without urinary or other infectious source. Plan for patient to have ERCP this afternoon. Would expect patient to become potentially febrile with worsen leukocytosis post procedure. Will monitor closely for additional IVFs.    -Biliary/Advanced GI consulted, appreciate recs  -ERCP 2/12  -Zosyn 4.5 mg q8h  -BC+E.coli  -CMP    Hx Pancreatic Adenocarcinoma with metastatic disease to liver  Initially presented with abdominal pain, weight loss 11/2021, and found to have CBD obstruction with both pancreatic head and tail mass along with 1 cm liver lesion. Had EUS/ERCP with CBD stent placement.  Biopsy of pancreatic tail mass with adenocarcinoma.  12/26/21 started FOLFIRINOX (Cy 1 FOLFOX). Last chemotherapy treatment 2/9.    -Med Onc cs  -Hold chemo pending GIB     HTN  Patient mildly hypertensive. Will hold antihypertensives in the setting of possible GIB.   -Hold amlodipine, hydrochlorothiazide, lisinopril, metoprolol    Chronic Problems  Constipation-on home miralax and senna  Neutropenia-managed with GCSF    The patient's presentation is complicated by the following clinically significant conditions requiring additional evaluation and treatment: - Hypercoagulable state requiring additional attention to DVT prophylaxis and treatment  - Metastatic cancer POA requiring further investigation, treatment, or monitoring     Issues Impacting Complexity of Management:  -The patient is at high  risk of complications from hematemesis, melena, transaminitis, leukocytosis    Medical Decision Making: Reviewed records from the following unique sources  Care Everywhere.    Checklist:  Diet: Regular Diet, NPO at MN  DVT PPx: Contraindicated - High Risk for Bleeding/Active Bleeding  Code Status: Full Code  Dispo: Patient appropriate for Inpatient based on expectation of ongoing need for hospitalization greater than two midnights based on severity of presentation/services including hematemesis, melena, leukocytosis    Team Contact Information:   Primary Team: Internal Medicine (MEDL)  Primary Resident: Antony Salmon, MD  Resident's Pager: [phone] (Gen MedL Intern - Alvester Morin)    Interval History:     Patient states she still has noted continued bowel movements with maroon blood.  She is very anxious and concerned for this hospitalization's outcome.  Team updated patient and spouse at bedside updated on current plan which eased her anxiety.    Objective:   Temp:  [36.3 ??C (97.3 ??F)-36.9 ??C (98.4 ??F)] 36.9 ??C (98.4 ??F)  Heart Rate:  [64-97] 64  SpO2 Pulse:  [64-98] 64  Resp:  [14-25] 17  BP: (98-184)/(44-71) 129/55  SpO2:  [98 %-100 %] 99 %    Gen: NAD, converses   HENT: atraumatic, normocephalic  Heart: RRR  Lungs: CTAB, no crackles or wheezes  Abdomen: soft, mild generalized tenderness in all four quadrants  Extremities: No edema

## 2022-10-07 LAB — CBC W/ AUTO DIFF
BASOPHILS ABSOLUTE COUNT: 0 10*9/L (ref 0.0–0.1)
BASOPHILS RELATIVE PERCENT: 0.2 %
EOSINOPHILS ABSOLUTE COUNT: 0 10*9/L (ref 0.0–0.5)
EOSINOPHILS RELATIVE PERCENT: 0.1 %
HEMATOCRIT: 22.3 % — ABNORMAL LOW (ref 34.0–44.0)
HEMOGLOBIN: 7.6 g/dL — ABNORMAL LOW (ref 11.3–14.9)
LYMPHOCYTES ABSOLUTE COUNT: 1.7 10*9/L (ref 1.1–3.6)
LYMPHOCYTES RELATIVE PERCENT: 19.6 %
MEAN CORPUSCULAR HEMOGLOBIN CONC: 34.2 g/dL (ref 32.0–36.0)
MEAN CORPUSCULAR HEMOGLOBIN: 28.8 pg (ref 25.9–32.4)
MEAN CORPUSCULAR VOLUME: 84.1 fL (ref 77.6–95.7)
MEAN PLATELET VOLUME: 7.3 fL (ref 6.8–10.7)
MONOCYTES ABSOLUTE COUNT: 0.1 10*9/L — ABNORMAL LOW (ref 0.3–0.8)
MONOCYTES RELATIVE PERCENT: 0.7 %
NEUTROPHILS ABSOLUTE COUNT: 6.9 10*9/L (ref 1.8–7.8)
NEUTROPHILS RELATIVE PERCENT: 79.4 %
PLATELET COUNT: 123 10*9/L — ABNORMAL LOW (ref 150–450)
RED BLOOD CELL COUNT: 2.65 10*12/L — ABNORMAL LOW (ref 3.95–5.13)
RED CELL DISTRIBUTION WIDTH: 15.2 % (ref 12.2–15.2)
WBC ADJUSTED: 8.7 10*9/L (ref 3.6–11.2)

## 2022-10-07 LAB — HEPATIC FUNCTION PANEL
ALBUMIN: 2.8 g/dL — ABNORMAL LOW (ref 3.4–5.0)
ALKALINE PHOSPHATASE: 181 U/L — ABNORMAL HIGH (ref 46–116)
ALT (SGPT): 138 U/L — ABNORMAL HIGH (ref 10–49)
AST (SGOT): 40 U/L — ABNORMAL HIGH (ref <=34)
BILIRUBIN DIRECT: 0.3 mg/dL (ref 0.00–0.30)
BILIRUBIN TOTAL: 0.9 mg/dL (ref 0.3–1.2)
PROTEIN TOTAL: 5.3 g/dL — ABNORMAL LOW (ref 5.7–8.2)

## 2022-10-07 LAB — MAGNESIUM: MAGNESIUM: 2.1 mg/dL (ref 1.6–2.6)

## 2022-10-07 LAB — HEMOGLOBIN AND HEMATOCRIT, BLOOD
HEMATOCRIT: 20.5 % — ABNORMAL LOW (ref 34.0–44.0)
HEMATOCRIT: 22.3 % — ABNORMAL LOW (ref 34.0–44.0)
HEMATOCRIT: 22.8 % — ABNORMAL LOW (ref 34.0–44.0)
HEMOGLOBIN: 7.1 g/dL — ABNORMAL LOW (ref 11.3–14.9)
HEMOGLOBIN: 7.7 g/dL — ABNORMAL LOW (ref 11.3–14.9)
HEMOGLOBIN: 7.9 g/dL — ABNORMAL LOW (ref 11.3–14.9)

## 2022-10-07 LAB — PHOSPHORUS: PHOSPHORUS: 3.1 mg/dL (ref 2.4–5.1)

## 2022-10-07 LAB — BASIC METABOLIC PANEL
ANION GAP: 5 mmol/L (ref 5–14)
BLOOD UREA NITROGEN: 11 mg/dL (ref 9–23)
BUN / CREAT RATIO: 13
CALCIUM: 8.5 mg/dL — ABNORMAL LOW (ref 8.7–10.4)
CHLORIDE: 108 mmol/L — ABNORMAL HIGH (ref 98–107)
CO2: 27 mmol/L (ref 20.0–31.0)
CREATININE: 0.84 mg/dL
EGFR CKD-EPI (2021) FEMALE: 80 mL/min/{1.73_m2} (ref >=60)
GLUCOSE RANDOM: 112 mg/dL (ref 70–179)
POTASSIUM: 4 mmol/L (ref 3.4–4.8)
SODIUM: 140 mmol/L (ref 135–145)

## 2022-10-07 MED ADMIN — piperacillin-tazobactam (ZOSYN) IVPB (premix) 4.5 g: 4.5 g | INTRAVENOUS | @ 22:00:00 | Stop: 2022-10-09

## 2022-10-07 MED ADMIN — multivitamins, therapeutic with minerals tablet 1 tablet: 1 | ORAL | @ 14:00:00

## 2022-10-07 MED ADMIN — piperacillin-tazobactam (ZOSYN) IVPB (premix) 4.5 g: 4.5 g | INTRAVENOUS | @ 06:00:00 | Stop: 2022-10-09

## 2022-10-07 MED ADMIN — pantoprazole (Protonix) injection 40 mg: 40 mg | INTRAVENOUS | @ 14:00:00

## 2022-10-07 MED ADMIN — prochlorperazine (COMPAZINE) injection 5 mg: 5 mg | INTRAVENOUS | @ 01:00:00

## 2022-10-07 MED ADMIN — piperacillin-tazobactam (ZOSYN) IVPB (premix) 4.5 g: 4.5 g | INTRAVENOUS | @ 14:00:00 | Stop: 2022-10-09

## 2022-10-07 NOTE — Unmapped (Signed)
Internal Medicine (MEDL) History & Physical    Assessment & Plan:   Norma Green is a 62 y.o. female whose presentation is complicated by HTN, pancreatic adenocarcinoma with metastasis to liver currently on chemotherapy s/p CBD stenting that presented to [facility] with hematemesis and melena, found to have MW tear and occluded CBD stent.     Principal Problem:    Hematemesis  Active Problems:    Pancreatic adenocarcinoma (CMS-HCC)    Melena    Leukocytosis  Resolved Problems:    * No resolved hospital problems. *      Active Problems    GIB s/p EGD with MW tears I Hematemesis I Melena I Hematochezia   Patient had chemotherapy 2/9.  Afterwards, experienced some nausea and vomiting.  She does typically get nauseous and has some vomiting with her chemotherapy. However, she has not had hematemesis until night prior to admission. 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  On admission, Hb at 9.4 from 10.3 on the day prior with BUN:Cr ratio 22.  Patient had RRT 2/12 called for hematochezia.  Hb dropped to 5.4. Was taken to OR for EGD after 2 units of PRBC with repeat Hb improved. Was found to have two MW tears at GE junction, s/p 2 clips.  Of note, after procedure was found soiled in maroon blood during bed transfer.   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 She received 20 mg of Decadron day prior which could contribute to leukocytosis. However, given immunocompromised, infectious work up obtained in the ED. S/p Cefepime, Vanc, IVFLR in the ED. Chest x-ray with no opacities/infiltrates noted. RPP negative. Lipase wnl. Repeat lactate elevated during RRT 2/11, likely iso acute GIB, which improved after transfusion and IVF.  With increasing LFTs and alk phos, initial concern for metal stent occlusion/or seeding from biliary tract. CT A/P 2/11 with biliary stent occlusion and stent associated cholangitis. Blood cultures 1 out of 2 + E.coli iso stent associated cholangitis without urinary or other infectious source. Plan for patient to have ERCP this afternoon. Would expect patient to become potentially febrile with worsen leukocytosis post procedure. Will monitor closely for additional IVFs.    -Biliary/Advanced GI consulted, appreciate recs  -ERCP 2/12  -Zosyn 4.5 mg q8h  -BC+E.coli  -CMP    Hx Pancreatic Adenocarcinoma with metastatic disease to liver  Initially presented with abdominal pain, weight loss 11/2021, and found to have CBD obstruction with both pancreatic head and tail mass along with 1 cm liver lesion. Had EUS/ERCP with CBD stent placement.  Biopsy of pancreatic tail mass with adenocarcinoma.  12/26/21 started FOLFIRINOX (Cy 1 FOLFOX). Last chemotherapy treatment 2/9.    -Med Onc cs  -Hold chemo pending GIB     HTN  Patient mildly hypertensive. Will hold antihypertensives in the setting of possible GIB.   -Hold amlodipine, hydrochlorothiazide, lisinopril, metoprolol    Chronic Problems  Constipation-on home miralax and senna  Neutropenia-managed with GCSF    The patient's presentation is complicated by the following clinically significant conditions requiring additional evaluation and treatment: - Hypercoagulable state requiring additional attention to DVT prophylaxis and treatment  - Metastatic cancer POA requiring further investigation, treatment, or monitoring     Issues Impacting Complexity of Management:  -The patient is at high  risk of complications from hematemesis, melena, transaminitis, leukocytosis    Medical Decision Making: Reviewed records from the following unique sources  Care Everywhere.    Checklist:  Diet: Regular Diet, NPO at MN  DVT PPx: Contraindicated - High Risk for Bleeding/Active Bleeding  Code Status: Full Code  Dispo: Patient appropriate for Inpatient based on expectation of ongoing need for hospitalization greater than two midnights based on severity of presentation/services including hematemesis, melena, leukocytosis    Team Contact Information:   Primary Team: Internal Medicine (MEDL)  Primary Resident: Antony Salmon, MD  Resident's Pager: [phone] (Gen MedL Intern - Alvester Morin)    Interval History:     Patient states she still has noted continued bowel movements with maroon blood.  She is very anxious and concerned for this hospitalization's outcome.  Team updated patient and spouse at bedside updated on current plan which eased her anxiety.    Objective:   Temp:  [36.3 ??C (97.3 ??F)-36.9 ??C (98.4 ??F)] 36.9 ??C (98.4 ??F)  Heart Rate:  [64-97] 64  SpO2 Pulse:  [64-98] 64  Resp:  [14-25] 17  BP: (98-184)/(44-71) 129/55  SpO2:  [98 %-100 %] 99 %    Gen: NAD, converses   HENT: atraumatic, normocephalic  Heart: RRR  Lungs: CTAB, no crackles or wheezes  Abdomen: soft, mild generalized tenderness in all four quadrants  Extremities: No edema

## 2022-10-07 NOTE — Unmapped (Signed)
Problem: Adult Inpatient Plan of Care  Goal: Plan of Care Review  Outcome: Progressing   Pt is A&O4, NSR, RA, VSS.   Denies pain .Pt is resting at this time.  Family at bedside.  Goal: Patient-Specific Goal (Individualized)  Outcome: Progressing  Goal: Absence of Hospital-Acquired Illness or Injury  Outcome: Progressing  Intervention: Identify and Manage Fall Risk  Recent Flowsheet Documentation  Taken 10/06/2022 2000 by Burnard Leigh, RN  Safety Interventions:   bleeding precautions   fall reduction program maintained   lighting adjusted for tasks/safety   low bed   family at bedside  Intervention: Prevent Skin Injury  Recent Flowsheet Documentation  Taken 10/06/2022 2000 by Burnard Leigh, RN  Positioning for Skin: Supine/Back  Skin Protection: adhesive use limited  Intervention: Prevent Infection  Recent Flowsheet Documentation  Taken 10/06/2022 2000 by Burnard Leigh, RN  Infection Prevention:   hand hygiene promoted   personal protective equipment utilized   rest/sleep promoted  Goal: Optimal Comfort and Wellbeing  Outcome: Progressing  Goal: Readiness for Transition of Care  Outcome: Progressing  Goal: Rounds/Family Conference  Outcome: Progressing     Problem: Self-Care Deficit  Goal: Improved Ability to Complete Activities of Daily Living  Outcome: Progressing     Problem: Fall Injury Risk  Goal: Absence of Fall and Fall-Related Injury  Outcome: Progressing  Intervention: Promote Injury-Free Environment  Recent Flowsheet Documentation  Taken 10/06/2022 2000 by Burnard Leigh, RN  Safety Interventions:   bleeding precautions   fall reduction program maintained   lighting adjusted for tasks/safety   low bed   family at bedside     Problem: Wound  Goal: Optimal Coping  Outcome: Progressing  Goal: Optimal Functional Ability  Outcome: Progressing  Goal: Absence of Infection Signs and Symptoms  Outcome: Progressing  Goal: Improved Oral Intake  Outcome: Progressing  Goal: Optimal Pain Control and Function  Outcome: Progressing  Goal: Skin Health and Integrity  Outcome: Progressing  Intervention: Optimize Skin Protection  Recent Flowsheet Documentation  Taken 10/06/2022 2000 by Burnard Leigh, RN  Pressure Reduction Techniques: frequent weight shift encouraged  Pressure Reduction Devices: pressure-redistributing mattress utilized  Skin Protection: adhesive use limited  Goal: Optimal Wound Healing  Outcome: Progressing

## 2022-10-07 NOTE — Unmapped (Signed)
VENOUS ACCESS TEAM PROCEDURE    Nurse request was placed for a PIV by Venous Access Team (VAT).  Patient was assessed at bedside for placement of a PIV. PPE were donned per protocol.  Access was obtained. Blood return noted.  Dressing intact and device well secured.  Flushed with normal saline.  See LDA for details.  Pt advised to inform RN of any s/s of discomfort at the PIV site. Patient with good vasculature visible by sight without tourniquet applied, appropriate for bedside RN IV attempt.      Workup / Procedure Time:  15 minutes        RN was notified. Pt has port, which is not accessed. Port is occluded and was de accessed.  Pt requests that port not be accessed until back for cancer tx.       Thank you,     Fredderick Erb, RN Venous Access Team

## 2022-10-07 NOTE — Unmapped (Signed)
RN Navigator attempted to contact pt to check in since hospital admission. Reached identified VM and left message.

## 2022-10-07 NOTE — Unmapped (Signed)
Pt has Port. RN will access

## 2022-10-07 NOTE — Unmapped (Incomplete)
Social Work  Psychosocial Assessment    Patient Name: Norma Green   Medical Record Number: [id number]   Date of Birth: 04-17-1961  Sex: Female     Per H&P   y.o. female whose presentation is complicated by HTN, pancreatic adenocarcinoma with metastasis to liver currently on chemotherapy s/p CBD stenting that presented to [facility] with hematemesis and melena.     Referral  Referred by: Care Manager  Reason for Referral: Complex Discharge Planning, Financial / Insurance Concerns    Assessment  SW attempted to contact patient via room phone (no answer). SW left voicemail on patient's [phone] line requesting a return call.     SW introduced self and role. Patient was oriented x4. SW spoke to patient and her husband Richard via phone to complete psychosocial assessment. Patient reports that they she needed assistance with ADLs at baseline. Patient receives 25hrs a week from [facility]. DME currently owned by patient includes walker, shower chair, and bedside commode. Patient was receiving infusions 2x every other week. Patient reports that they currenltly live at [address] in [place], [place] [address].    Daughter 55, son 50 and grandchildren. There are no steps at entrance to a two level home (bedroom on the second floor about 18). She was   Sons providers transportation.      Patient is/was employed as a ***. Patient is *** retired, Patient  SSDI [phone] and food stamps, section 8.     Utilities, water, power, light   [employer]  [employer] (15) ?? Housing authorities  Website  Directions  Contact us  [address], [place], [place] [address] ?? ~25.1 mi  [phone]    Medicare contact     Patient was provided education on AIR, SNF and HH. They report that their preference would be to *** upon discharge.     SW reviewed SDOH food insecurity, housing affordability, financial resource strain and transportation. SW informed patient of programs that can help provide financial assistance with water, power and nutrition (SNAP, LIEAP, and LIWHAP). Patient *** the need for these resources at this time. *** resources were placed in AVS. SW encouraged patient to reach out to SW if needed. Patient verbalized understanding.      SW attempted to contact room phone [phone].     Extended Emergency Contact Information  Primary Emergency Contact: Evans,Richard  Mobile Phone: [phone]  Relation: Spouse  Preferred language: ENGLISH  Interpreter needed? No  Secondary Emergency Contact: Evans,Daishawn  Mobile Phone: [phone]  Relation: Son    Legal Next of Kin / Guardian / POA / Advance Directives    HCDM (patient stated preference): Bonnye Fava - Son - [phone]    Advance Directive (Medical Treatment)  Does patient have an advance directive covering medical treatment?: Patient has advance directive covering medical treatment, copy not in chart.  Advance directive covering medical treatment not in Chart:: Copy requested from family  Reason patient does not have an advance directive covering medical treatment:: Patient does not wish to complete one at this time.    Health Care Decision Maker [HCDM] (Medical & Mental Health Treatment)  Healthcare Decision Maker: HCDM documented in the HCDM/Contact Info section.  Information offered on HCDM, Medical & Mental Health advance directives:: Patient declined information.         Discharge Planning  Discharge Planning Information:   Type of Residence   Mailing Address:  [address]  [place] [address]    Medical Information   Past  Medical History:   Diagnosis Date    Allergic rhinitis 12/30/2007    Hypertension     Murmur, heart     Sleep related leg cramps 11/03/2011    Tobacco use disorder 11/03/2011       Past Surgical History:   Procedure Laterality Date    CHEMOTHERAPY  2023    Stomach    IR INSERT PORT AGE GREATER THAN 5 YRS  12/26/2021    IR INSERT PORT AGE GREATER THAN 5 YRS 12/26/2021 Alease Frame, NP IMG VIR [facility]    OVARIAN CYST REMOVAL      PR COLONOSCOPY W/BIOPSY SINGLE/MULTIPLE  08/03/2013    Procedure: COLONOSCOPY, FLEXIBLE, PROXIMAL TO SPLENIC FLEXURE; WITH BIOPSY, SINGLE OR MULTIPLE;  Surgeon: Romero Belling, MD;  Location: GI PROC[facility]URES [facility];  Service: Gastroenterology    PR ERCP STENT PLACEMENT BILIARY/PANCREATIC DUCT N/A 12/12/2021    Procedure: ENDOSCOPIC RETROGRADE CHOLANGIOPANCREATOGRAPHY (ERCP); WITH PLACEMENT OF ENDOSCOPIC STENT INTO BILIARY OR PANCREATIC DUCT;  Surgeon: Vonda Antigua, MD;  Location: GI [facility];  Service: Gastroenterology    PR ERCP,SPHINCTEROTOMY N/A 12/12/2021    Procedure: ERCP; W/SPHINCTEROTOMY/PAPILLOTOMY;  Surgeon: Vonda Antigua, MD;  Location: GI [facility]URES [facility];  Service: Gastroenterology    PR UPGI ENDOSCOPY W/US FN BX N/A 12/12/2021    Procedure: UGI W/ TRANSENDOSCOPIC ULTRASOUND GUIDED INTRAMURAL/TRANSMURAL FINE NEEDLE ASPIRATION/BIOPSY(S), ESOPHAGUS;  Surgeon: Vonda Antigua, MD;  Location: GI PROCEDURES [facility];  Service: Gastroenterology    PR UPPER GI ENDOSCOPY,CTRL BLEED N/A 10/05/2022    Procedure: UGI ENDOSCOPY; WITH CONTROL OF BLEEDING, ANY METHOD;  Surgeon: Dub Mikes, MD;  Location: MAIN OR [facility];  Service: Gastroenterology    TUBAL LIGATION Bilateral 08/25/1996       Family History   Problem Relation Age of Onset    Hypertension Mother     Stroke Mother     Coronary artery disease Mother     Diabetes Father     Diabetes Sister     No Known Problems Daughter     No Known Problems Maternal Grandmother     No Known Problems Maternal Grandfather     No Known Problems Paternal Grandmother     No Known Problems Paternal Grandfather     No Known Problems Other     Breast cancer Neg Hx     Cancer Neg Hx     Colon cancer Neg Hx     Endometrial cancer Neg Hx     Ovarian cancer Neg Hx     BRCA 1/2 Neg Hx        Financial Information   Primary Insurance: Payor: MEDICAID [place] / Plan: MEDICAID [place] ACCESS / Product Type: *No Product type* /    Secondary Insurance: None   Prescription Coverage: Medicaid   Preferred Pharmacy: [facility] - [place] ([place]), [place] - [address]  [facility]  [facility]  [id number] - [place], [place] - [address]    Barriers to taking medication: N/A    Transition Home   Transportation at time of discharge: {CMDCTRANSPORT:30425935}    Anticipated changes related to Illness: {CM Anticipated Changes:38704}   Services in place prior to admission: Home Based Services: ***  Home Medical Equipment: ***   Services anticipated for DC: {CM DC Services-:38711}   Hemodialysis Prior to Admission: {EXAM; YES/NO:19492::No}    Readmission  Risk of Unplanned Readmission Score: UNPLANNED READMISSION SCORE: 17.82%  Readmitted  Within the Last 30 Days?   Readmission Factors include: {CM 30D Readm:38703}    Social Determinants of Health  Social Determinants of Health     Financial Resource Strain: Medium Risk (10/07/2022)    Overall Financial Resource Strain (CARDIA)     Difficulty of Paying Living Expenses: Somewhat hard   Internet Connectivity: No Internet connectivity concern identified (12/17/2021)    Internet Connectivity     Do you have access to internet services: Yes     How do you connect to the internet: Personal Device at home     Is your internet connection strong enough for you to watch video on your device without major problems?: Yes     Do you have enough data to get through the month?: Yes     Does at least one of the devices have a camera that you can use for video chat?: Yes   Food Insecurity: Food Insecurity Present (10/07/2022)    Hunger Vital Sign     Worried About Running Out of Food in the Last Year: Sometimes true     Ran Out of Food in the Last Year: Sometimes true   Tobacco Use: Medium Risk (10/06/2022)    Patient History     Smoking Tobacco Use: Former     Smokeless Tobacco Use: Never     Passive Exposure: Not on file   Housing/Utilities: Low Risk (05/23/2022)    Housing/Utilities     Within the past 12 months, have you ever stayed: outside, in a car, in a tent, in an overnight shelter, or temporarily in someone else's home (i.e. couch-surfing)?: No     Are you worried about losing your housing?: No     Within the past 12 months, have you been unable to get utilities (heat, electricity) when it was really needed?: No   Alcohol Use: Not on file   Transportation Needs: Unmet Transportation Needs (10/07/2022)    PRAPARE - Transportation     Lack of Transportation (Medical): No     Lack of Transportation (Non-Medical): Yes   Substance Use: Not on file   Health Literacy: Low Risk  (12/17/2021)    Health Literacy     : Never   Physical Activity: Not on file   Interpersonal Safety: Not on file   Stress: Not on file   Intimate Partner Violence: Not At Risk (06/18/2021)    Received from [facility]    Humiliation, Afraid, Rape, and Kick questionnaire     Fear of Current or Ex-Partner: No     Emotionally Abused: No     Physically Abused: No     Sexually Abused: No   Depression: Not on file   Social Connections: Not on file     Social History  Support Systems/Concerns: Family Members, Youth worker Service: No Clinical cytogeneticist and Psychiatric History  Psychosocial Stressors: Engineer, materials Dependency: None        Outpatient Providers: Primary Care Provider   Name / Solicitor #: : [facility]  Legal: No legal issues      Ability to [employer]: Unfamiliar with options/procedures for obtaining

## 2022-10-07 NOTE — Unmapped (Signed)
Biliary & Advanced Endoscopy Consult Service   Treatment Plan         Recommendations:   Patient s/p ERCP today.     Findings:  A scout film of the abdomen was obtained.  One stent ending in the lower third of the main bile duct was seen. Hemostatic clips were seen in the area of the distal esophagus.  The esophagus was successfully intubated under direct vision without detailed examination of the pharynx, larynx, and associated structures.  The upper GI tract was traversed under direct vision without detailed examination.  Two endoclips were found at the gastroesophageal junction.    One covered metal biliary stent originating in the biliary tree was emerging from the major papilla.  The stent was partially occluded.  The bile duct was deeply cannulated with the 8.5 mm balloon through the existing metal stent.  Contrast was injected.  I personally interpreted the bile duct images.  Ductal flow of contrast was adequate.  Image quality was excellent.  Contrast extended to the entire biliary tree.  The intra-hepatic and extra-hepatic biliary duct system was normal. There was no stenosis noted in the main bile duct.  The biliary tree was swept with an 8.5 mm balloon starting at the bifurcation.  Moderate amount of sludge was swept from the duct.      The biliary stent and the endoclips were present at the end of the procedure.       Impression:  - An endoclip was found in the esophagus.   - One partially occluded stent from the biliary tree was seen in the major papilla.   - The biliary tree was swept and sludge was found.   - No specimens collected.       Recommendations:  - Return patient to hospital ward for ongoing care.   - Resume previous diet.   - Continue present medications.

## 2022-10-08 LAB — CBC
HEMATOCRIT: 23.4 % — ABNORMAL LOW (ref 34.0–44.0)
HEMOGLOBIN: 8.2 g/dL — ABNORMAL LOW (ref 11.3–14.9)
MEAN CORPUSCULAR HEMOGLOBIN CONC: 34.9 g/dL (ref 32.0–36.0)
MEAN CORPUSCULAR HEMOGLOBIN: 29.5 pg (ref 25.9–32.4)
MEAN CORPUSCULAR VOLUME: 84.5 fL (ref 77.6–95.7)
MEAN PLATELET VOLUME: 6.9 fL (ref 6.8–10.7)
PLATELET COUNT: 132 10*9/L — ABNORMAL LOW (ref 150–450)
RED BLOOD CELL COUNT: 2.77 10*12/L — ABNORMAL LOW (ref 3.95–5.13)
RED CELL DISTRIBUTION WIDTH: 14.9 % (ref 12.2–15.2)
WBC ADJUSTED: 6.2 10*9/L (ref 3.6–11.2)

## 2022-10-08 LAB — HEPATITIS PANEL, ACUTE
HEPATITIS A IGM ANTIBODY: NONREACTIVE
HEPATITIS B CORE IGM ANTIBODY: NONREACTIVE
HEPATITIS B SURFACE ANTIGEN: NONREACTIVE
HEPATITIS C ANTIBODY: NONREACTIVE

## 2022-10-08 LAB — HEPATIC FUNCTION PANEL
ALBUMIN: 3 g/dL — ABNORMAL LOW (ref 3.4–5.0)
ALKALINE PHOSPHATASE: 167 U/L — ABNORMAL HIGH (ref 46–116)
ALT (SGPT): 94 U/L — ABNORMAL HIGH (ref 10–49)
AST (SGOT): 23 U/L (ref <=34)
BILIRUBIN DIRECT: 0.2 mg/dL (ref 0.00–0.30)
BILIRUBIN TOTAL: 0.6 mg/dL (ref 0.3–1.2)
PROTEIN TOTAL: 5.9 g/dL (ref 5.7–8.2)

## 2022-10-08 LAB — PHOSPHORUS: PHOSPHORUS: 2.3 mg/dL — ABNORMAL LOW (ref 2.4–5.1)

## 2022-10-08 LAB — BASIC METABOLIC PANEL
ANION GAP: 6 mmol/L (ref 5–14)
BLOOD UREA NITROGEN: 12 mg/dL (ref 9–23)
BUN / CREAT RATIO: 15
CALCIUM: 8.3 mg/dL — ABNORMAL LOW (ref 8.7–10.4)
CHLORIDE: 108 mmol/L — ABNORMAL HIGH (ref 98–107)
CO2: 27 mmol/L (ref 20.0–31.0)
CREATININE: 0.79 mg/dL
EGFR CKD-EPI (2021) FEMALE: 86 mL/min/{1.73_m2} (ref >=60)
GLUCOSE RANDOM: 124 mg/dL (ref 70–179)
POTASSIUM: 3.3 mmol/L — ABNORMAL LOW (ref 3.4–4.8)
SODIUM: 141 mmol/L (ref 135–145)

## 2022-10-08 MED ORDER — LEVOFLOXACIN 750 MG TABLET
ORAL_TABLET | ORAL | 0 refills | 4 days | Status: CP
Start: 2022-10-08 — End: 2022-10-12
  Filled 2022-10-08: qty 4, 4d supply, fill #0

## 2022-10-08 MED ORDER — OXYCODONE 5 MG TABLET
ORAL_TABLET | Freq: Four times a day (QID) | ORAL | 0 refills | 4 days | Status: CN | PRN
Start: 2022-10-08 — End: 2022-10-13

## 2022-10-08 MED ADMIN — oxyCODONE (ROXICODONE) immediate release tablet 5 mg: 5 mg | ORAL | @ 03:00:00 | Stop: 2022-10-18

## 2022-10-08 MED ADMIN — multivitamins, therapeutic with minerals tablet 1 tablet: 1 | ORAL | @ 15:00:00 | Stop: 2022-10-08

## 2022-10-08 MED ADMIN — pantoprazole (Protonix) injection 40 mg: 40 mg | INTRAVENOUS | @ 01:00:00

## 2022-10-08 MED ADMIN — piperacillin-tazobactam (ZOSYN) IVPB (premix) 4.5 g: 4.5 g | INTRAVENOUS | @ 14:00:00 | Stop: 2022-10-08

## 2022-10-08 MED ADMIN — piperacillin-tazobactam (ZOSYN) IVPB (premix) 4.5 g: 4.5 g | INTRAVENOUS | @ 05:00:00 | Stop: 2022-10-08

## 2022-10-08 MED ADMIN — pantoprazole (Protonix) injection 40 mg: 40 mg | INTRAVENOUS | @ 14:00:00 | Stop: 2022-10-08

## 2022-10-08 MED ADMIN — potassium chloride ER tablet 40 mEq: 40 meq | ORAL | @ 17:00:00 | Stop: 2022-10-08

## 2022-10-08 MED ADMIN — potassium phosphate 30 mmol in sodium chloride (NS) 0.9 % 250 mL infusion: 30 mmol | INTRAVENOUS | @ 17:00:00 | Stop: 2022-10-08

## 2022-10-08 NOTE — Unmapped (Signed)
Pt VSS this shift. Call bell within reach. Resting between cares. Husband at bedside overnight. Pt is floor status awaiting bed assignment.         A&Ox4, anxious at times. RA with sats > 92%, lung sounds clear/diminished. Non-productive cough. NSR-SA per tele with occasional PACs, BP WDL. Voiding in bathroom with adequate output. Last BM: 2/13. Complaints of pain overnight controlled with PRN oxy.     Problem: Adult Inpatient Plan of Care  Goal: Plan of Care Review  Outcome: Progressing  Goal: Patient-Specific Goal (Individualized)  Outcome: Progressing  Goal: Absence of Hospital-Acquired Illness or Injury  Outcome: Progressing  Intervention: Identify and Manage Fall Risk  Recent Flowsheet Documentation  Taken 10/07/2022 2000 by Cheral Almas I, RN  Safety Interventions:   aspiration precautions   bleeding precautions   commode/urinal/bedpan at bedside   fall reduction program maintained  Intervention: Prevent Infection  Recent Flowsheet Documentation  Taken 10/07/2022 2000 by Cheral Almas I, RN  Infection Prevention: hand hygiene promoted  Goal: Optimal Comfort and Wellbeing  Outcome: Progressing  Goal: Readiness for Transition of Care  Outcome: Progressing  Goal: Rounds/Family Conference  Outcome: Progressing     Problem: Self-Care Deficit  Goal: Improved Ability to Complete Activities of Daily Living  Outcome: Progressing     Problem: Fall Injury Risk  Goal: Absence of Fall and Fall-Related Injury  Outcome: Progressing  Intervention: Promote Injury-Free Environment  Recent Flowsheet Documentation  Taken 10/07/2022 2000 by Cheral Almas I, RN  Safety Interventions:   aspiration precautions   bleeding precautions   commode/urinal/bedpan at bedside   fall reduction program maintained     Problem: Wound  Goal: Optimal Coping  Outcome: Progressing  Goal: Optimal Functional Ability  Outcome: Progressing  Intervention: Optimize Functional Ability  Recent Flowsheet Documentation  Taken 10/08/2022 0000 by Cheral Almas I, RN  Activity Management: up ad lib  Taken 10/07/2022 2000 by Cheral Almas I, RN  Activity Management: up ad lib  Goal: Absence of Infection Signs and Symptoms  Outcome: Progressing  Intervention: Prevent or Manage Infection  Recent Flowsheet Documentation  Taken 10/07/2022 2000 by Cheral Almas I, RN  Infection Management: aseptic technique maintained  Goal: Improved Oral Intake  Outcome: Progressing  Goal: Optimal Pain Control and Function  Outcome: Progressing  Goal: Skin Health and Integrity  Outcome: Progressing  Intervention: Optimize Skin Protection  Recent Flowsheet Documentation  Taken 10/08/2022 0000 by Cheral Almas I, RN  Activity Management: up ad lib  Taken 10/07/2022 2000 by Cheral Almas I, RN  Activity Management: up ad lib  Goal: Optimal Wound Healing  Outcome: Progressing

## 2022-10-08 NOTE — Unmapped (Signed)
Oncology Treatment Plan Note    Requesting Attending Physician :  Suzzanne Cloud, MD  Service Requesting: [facility] ([facility])  Primary Oncologist: Jaci Carrel, ANP  Jaci Carrel, ANP    Assessment: Norma Green is a 62 y.o. female who has a past medical history of Allergic rhinitis, Hypertension, Murmur, heart, Sleep related leg cramps, and Tobacco use disorder, and pancreatic adenocarcinoma with metastasis to liver currently on chemotherapy s/p CBD stenting who was admitted for hematemesis and melena. Found to have MW tear and occluded CBD stent now addressed and pending discharge. Cycle 20 of mFOLFIRINOX 10/03/22. Next cycle already scheduled for 02/23. Will keep existing schedule. If patient remains inpatient, please let oncology know to reschedule.     This patient has been staffed with Dr. Suzi Roots. These recommendations were discussed with the primary team.     Please contact the oncology consult fellow at [phone] with any further questions.    Victorio Palm, MD PhD  Hematology / Oncology Fellow

## 2022-10-08 NOTE — Unmapped (Signed)
Internal Medicine (MEDL) History & Physical    Assessment & Plan:   Norma Green is a 62 y.o. female whose presentation is complicated by HTN, pancreatic adenocarcinoma with metastasis to liver currently on chemotherapy s/p CBD stenting that presented to [facility] with hematemesis and melena, found to have MW tear and occluded CBD stent.     Principal Problem:    Hematemesis  Active Problems:    Pancreatic adenocarcinoma (CMS-HCC)    Melena    Leukocytosis  Resolved Problems:    * No resolved hospital problems. *      Active Problems    GIB s/p EGD with MW tears I Hematemesis I Melena I Hematochezia   Patient had chemotherapy 2/9.  Afterwards, experienced some nausea and vomiting.  Was found to have two MW tears at GE junction, s/p 2 clips.  Of note, after procedure was found soiled in maroon blood during bed transfer at that time. Today patient denies any melena. Hgb appears to have stabilized. Low suspicion for continued bleed post procedure.    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 She received 20 mg of Decadron day prior which could contribute to leukocytosis. However, given immunocompromised, infectious work up obtained in the ED. S/p Cefepime, Vanc, IVFLR in the ED. Blood cultures 1 out of 2 + E.coli iso stent associated cholangitis without urinary or other infectious source. ERCP completed 2/12 with obstruction relieved. Now that source control is obtained. Will plan to narrow abx to PO abx tomorrow.  -Biliary/Advanced GI consulted, appreciate recs  -ERCP 2/12  -Zosyn 4.5 mg q8h  -CMP    Hx Pancreatic Adenocarcinoma with metastatic disease to liver  Initially presented with abdominal pain, weight loss 11/2021, and found to have CBD obstruction with both pancreatic head and tail mass along with 1 cm liver lesion. Had EUS/ERCP with CBD stent placement. Biopsy of pancreatic tail mass with adenocarcinoma.  12/26/21 started FOLFIRINOX (Cy 1 FOLFOX). Last chemotherapy treatment 2/9.    -Med Onc cs  -Hold chemo     HTN  Patient mildly hypertensive. No indication to restart meds at this time.  -Hold amlodipine, hydrochlorothiazide, lisinopril, metoprolol    Chronic Problems  Constipation-on home miralax and senna  Neutropenia-managed with GCSF    The patient's presentation is complicated by the following clinically significant conditions requiring additional evaluation and treatment: - Hypercoagulable state requiring additional attention to DVT prophylaxis and treatment  - Metastatic cancer POA requiring further investigation, treatment, or monitoring     Issues Impacting Complexity of Management:  -The patient is at high risk of complications from hematemesis, melena, transaminitis, leukocytosis    Medical Decision Making: Reviewed records from the following unique sources  Care Everywhere.    Checklist:  Diet: Regular Diet, NPO at MN  DVT PPx: Contraindicated - High Risk for Bleeding/Active Bleeding  Code Status: Full Code  Dispo: Patient appropriate for Inpatient based on expectation of ongoing need for hospitalization greater than two midnights based on severity of presentation/services including hematemesis, melena, leukocytosis    Team Contact Information:   Primary Team: Internal Medicine (MEDL)  Primary Resident: Ileene Musa, MD  Resident's Pager: [phone] (Gen MedL Intern - Alvester Morin)    Interval History:     Patient states she has not had any signs of bleeding today. Is happy she  is nearing d/c    Objective:   Temp:  [36.3 ??C (97.3 ??F)-36.9 ??C (98.4 ??F)] 36.9 ??C (98.4 ??F)  Heart Rate:  [64-97] 64  SpO2 Pulse:  [64-98] 64  Resp:  [14-25] 17  BP: (98-184)/(44-71) 129/55  SpO2:  [98 %-100 %] 99 %    Gen: NAD, converses   HENT: atraumatic, normocephalic  Heart: RRR  Lungs: CTAB, no crackles or wheezes  Abdomen: soft, mild generalized tenderness in all four quadrants  Extremities: No edema    Soyla Dryer, MD, MPH, MS  [facility] Internal Medicine PGY1

## 2022-10-08 NOTE — Unmapped (Addendum)
To apply for section 8 housing assistance:  [facility]  [address],   [place], [place] [address]  [phone]    Contact Medicare:  1-800-MEDICARE 409-656-3809)  For assistance with enrolling in a Medicare Plan    [facility] Department of Social Services Financial Resources    The Low Income Household Water Assistance Program [facility]) is a federally-funded program that provides emergency assistance to low-income households for drinking water and wastewater services.     -HOW TO APPLY FOR LIHWAP ASSISTANCE:    Apply Online at: [email or web address]  Phone: You can apply by phone by calling [phone]  Paper/ In-person: Please feel free to visit [facility] of Social Services located at [address] in [place], [place] [address].      The Low Income Energy Assistance Program (LIEAP) is a federally-funded program that provides a one-time payment to help eligible households pay their heating bills.    -HOW TO APPLY FOR (LIEAP) ASSISTANCE:    Apply Online at: [email or web address]  Phone: You can apply by phone by calling [phone]  Paper/ In-person: Please feel free to visit [facility] of Social Services located at [address] in [place], [place] [address].      The [employer]. is located at [address] in [place], [place] [address]. Their telephone number is [phone]. Case managers will offer support to those that are in need.   Psychologist, forensic located at [employer] in [place], [place]. Their phone number is [phone]. They offer cash assistance to those in need of help with paying their utility bills, heating fuel, and they can also distribute prescription medication vouchers.   [place] 211 is an information and referral service provided by [employer] of [place]. Families and individuals can dial 2-1-1 or 680-471-9331 to obtain free and confidential information on health and human services and resources within their community.    [place] COUNTY RESOURCE LIST    Transportation    1. Medicaid Transportation:  Individuals who have Medicaid insurance are eligible for assistance w/ transportation to non-emergency medical appointments. You must call ahead of time to schedule a ride, typically 2-3 business days ahead of time depending on the service. You cannot call day-of.   Contact:  Managed Medicaid Plans:  [facility] Caritas  5165000430  [facility] Complete  [phone]  Healthy Blue  [phone]  [facility] Plan  [phone])026-3219  [facility]  534-713-0816  Tailored Medicaid Plans:  [facility]  [phone]  [place]  [phone]  [facility]  [phone]  [facility]  [phone]  [facility] Resources  [phone]  [facility]  [phone]   Standard Medicaid ([place] Medicaid, Medicaid [place] Access)  Contact your local Department of Social Services Office: For [facility] residents, call [phone]     2. [place] [employer] (ACTA):  ACTA provides non-emergency transport for residents of [employer] to doctors appointments, errands, work, Catering manager. Also provides a service for residents 60 years and older (Dial-A-Ride)  ACTA Main Phone: [phone]   Website: [email or web address]   Dial-A-Ride: transportation service for people 40 years of age or older. Some financial assistance may be available. Call the Dial-A-Ride Administrator at [phone] for information and to enroll.    Questions about [employer] and Services:  Who Can Ride?  Anyone requiring transportation in [place] is eligible to ride the [employer]. ACTA provides transportation for general purpose trips, medical  trips, and almost any non-emergency trip destination. In addition, special programs and pricing are available to qualified riders based on eligibility requirements.    How Do I Schedule A Ride?  To request transportation service, call the ACTA office at [phone] between the hours of 8:00 a.m. and 5:00 p.m., Monday-Friday. Rides are scheduled on a first come, first serve basis and must be made no later than 11:00am the working day before a trip and based on bus availability. Ride requests are not taken during the weekend. Requests for transportation on Mondays must be made at the latest by 11:00am the preceding Friday. Once capacity is reached, additional requests for trips will be deferred to later date options.     What Information is Needed?  You will need to provide the following information when you call:  Your name  Your address  Your telephone number  The appointment date  The appointment time  Your destination address  Your return time  Special requirements (i.e., child seat, wheelchair)  The name and relationship of anyone traveling with you    When Can I Ride?  Monday-Friday, 5:00 AM - 5:30 PM (no trip request is started after 5:00 PM). Riders should be ready to be picked up based on the Trip Times below.    Trip Times  All riders can expect a ride time of no more than 60 minutes aboard; generally the ride time will be less than 60 minutes.  Buses are not permitted to wait on riders over 3 minutes once the bus arrives for pick-up. The driver will blow the horn upon arrival and (if safe) knock on your door shortly after if you do not respond to the horn. If no response, you will be marked no show and the bus depart. Three no shows in 60-days will result in riding privileges suspension.  Going to Work, Aeronautical engineer - Riders going to work, doctor, or dialysis appointments may be dropped off up to 30 minutes ahead of their scheduled drop-off times. Pick-up time may therefore be up to 90 minutes before appointments (30 minutes early drop off + 60 max. ride time). This is the absolute maximum time with shorter intervals more often being the case.  General trips (Shopping, etc.) - You will be picked-up generally at your scheduled pick-up time. However, ACTA may pick you up between 30 minutes before or 30 minutes after the scheduled pick-up. This flexibility is needed to accommodate other riders during the day. For your return trip home, ACTA will either pick-up at your scheduled time or no later than 30 minutes afterwards.  Return trips from Work- You will be picked-up at your scheduled pick-up time but no later than 30 minutes afterwards. You will not be picked-up any earlier than your scheduled return pick-up time.  Return trips that are Will Call (Medical Trips) - You will be picked up as soon as a bus becomes available to pick you up. While ACTA would like for this pick-up to be as quick as possible after receiving your call, it could take up to 60 minutes once you contact ACTA for your vehicle to arrive.  OE, Friendship, Group Trips- Riders will be placed on daily routes with close to the same pick-up and drop-off times as can be arranged. Riders should be ready early in the event buses are running ahead of schedule.    What is Dial-A-Ride?  Dial-A-Ride is a transportation service for people 60 years of age or older. Call the Dial-A-Ride Administrator  at [phone] for information on eligibility, fare assistance, rides provided on a donation basis and other support made possible through federal and state grants    How Do I Cancel My Ride?  If you must cancel a scheduled trip, please notify the ACTA office at [phone]. Any cancellations should be made by 11:00 am the business day before your ride. Otherwise, it will be considered a no-show. If you receive 3 no-shows within a 60-day period, you may be suspended from services for 30 days.    How Much Does it Cost?  Within [facility] Public:  One-Way - $5.00                                                           Round Trip - $10.00                                                                      [facility] Public:                              One-Way - $5.00                             Round Trip - $10.00  Out of county non-emergency medical trips can be arranged by calling the [employer]. Payment for transportation service must be made to the driver.  ACTA accepts cash, checks, or money orders in the exact amount of the fare. The driver can issue a receipt for the cash amount collected. Required attendant will not be charged. All others will be charged regular fares.Packets of ten (10) tickets may be purchased in advance by contacting the ACTA office and are strongly encouraged.  All prices are subject to change without notice.      [place] [employer] Life Assistance:    Catholic [employer]ities:  Phone: 615-696-4019   https://www.catholiccharitiesraleigh.org/dcfp-programs/  Resources: Museum/gallery curator, Corporate investment banker, Case Management, Emergency Utility Assistance.  - [employer] offers emergency financial assistance for utility bills such as electric, gas, or water. Funds are limited and assistance is administered on a first come first served basis. If you need assistance, please call [phone] or email [email or web address] [email or web address]   [address] [facility]):   [address], [place] [address]  [phone]    CityGate Dream Center:  https://citygatedreamcenter.com/  https://citygatedreamcenter.com/programs/  Museum/gallery curator, Emergency Financial Assistance, Family Programs, and many more resources!     [address], [place], [place] [address]  [phone]    CW237  Call 211 to get connected to financial assistance agencies in your area or visit https://www.mckee-[email or web address]/  If you need help finding assistance with housing, food, healthcare, utility payments, and more, we can help. 2-1-1 Referral Specialists are available 24 hours a day, every day by dialing 2-1-1 or [phone] from any phone. The call is free, confidential, and available in any language.      [facility] Department of Social  Services  https://www.[place].com/dss/  [email or web address]   [address] [place], [place] [address]  Phone [phone]   Emergency Assistance w/ utilities, Food Stamps, Medicaid, etc.   Crisis Intervention Program (CIP), Low Income Energy Assistance Program ([place]), Low Income Household Water Assistance Program (LIHWAP)      [facility] Agency   Address: [address] [place], [place] [address]   Phone: [phone]   Website: www.alamanceservices.org   Service(s) Offered: Housing services, self-sufficiency, congregate meal program, and individual development account program.     [employer] of [place]   Address: [address] [place], [place] [address]   Phone: [phone]   Email: info@alliedchurches .org   Website: www.alliedchurches.org   Service(s) Offered: Housing the homeless, feeding the hungry, Company secretary, job and education related services.     [facility]   Address: [address] [place], [place] [address]   Phone: [phone] or [phone]   Email: [email or web address]   Service(s) Offered: Family services and transient assistance; emergency food, fuel, clothing, limited furniture, utilities; budget counseling, general counseling; give a kid a coat; thrift store; Christmas food and toys. Utility assistance, food pantry, rental assistance, life sustaining medicine    Women's Resource Center: 234-101-4616   Emergency Financial Assistance when available.    [place] [employer] Food Assistance:    [facility] Department of Social Services  https://www.[email or web address]/dss/  [email or web address]   [address] [place], [place] [address]  Phone [phone]   Food Stamps.    [place] Family Empowerment (SAFE) Food Pantry:  [phone]    S.A.F.E. Food Ministry is open from 9am-12pm on Tuesdays and Saturdays except the first & fifth Saturday of every month.  [email or web address].ch    City[facility] Dream Center:  https://citygatedreamcenter.com/  https://citygatedreamcenter.com/programs/  Museum/gallery curator, Emergency Financial Assistance, Family Programs, and many more resources!     [address], [place], [place] [address]  [phone]    Other Community Agencies/churches that provide food assistance:  [facility] [phone]   [facility] [employer] Health Solutions Agency [phone]   Caring Kitchen [phone] Dream Center [phone]   [facility] Meals on Wheels [phone]   [facility] (age 45+) [phone]   Allied Churches [phone]   [place] [phone]   [facility] Sacrament/Little Portions Food Pantry [phone]   First [address] of [place]ree Ditto [phone]   DreamAlign [phone]   [facility] [phone] Tamms of [place] [phone]   First [facility] [employer] [phone]   [facility] [phone]   [facility] [phone] [place] of [place] [phone]   Morning [address] [place] [phone]   [facility] [phone]   [place] [facility] [phone]    SAFE [phone]    [facility] Army [phone]    Clarisa Fling [phone]   [facility] [phone]     [facility] Housing Resources:    Emergency Shelters:  AQ762  Call 211 to get connected to emergency shelter in your area    [place] [phone] (men with substance abuse issues)   Allied Churches: [phone] (night shelter for homeless women, men, families)     Eviction & Landlord Mediation    Legal Aid [facility] [place]  Housing Helpline: 442-109-0239  Assistance w/ issues w/: eviction, landlord refusing housing assistance, repairs/maintenance, public housing and section 8, and other landlord-tenant issues.    Affordable Housing / Hospital doctor / Assistance    [employer]  Authority [phone]    Kelly Services [phone]    NCHousingSearch.org  Search Tool for Affordable/low-income housing: http://www.davis-wright.info/  For help searching, please call [phone] (toll free) Monday-Friday, 9:00 a.m. - 8:00 p.m      HUD Affordable Housing Search  ClosetRepublicans.fi  You can use this map to find a privately owned apartment with reduced rents.  To apply: contact the apartment management office.     HUD Housing Counseling Service  The nationwide network of HUD participating housing counseling agencies have been helping consumers across [place] for more than 50 years by providing the answers they need to make informed housing decisions.  Use the search tool to find a local HUD participating housing counseling agency near you: https://hudgov-answers.https://www.warren.com/  or call toll-free [phone]      HUD [facility] Office  (Serves all of [place])  Assistance with locating affordable housing, avoiding foreclosure, help to stay in your home, etc.  http://www.lowery.com/  Contact us by Phone: To reach our office, you may call us at [phone]. You will be prompted to choose from the following selections:  Enter the extension of the person you are calling if you know their extension or press:  1 - If you know the last name of the person you are calling  3 - For Public Housing/Section 8 inquiries  4 - For our office of Copy and Development  5 - For our office of Fair Housing and Equal Opportunity  6 - For our office of Multifamily Asset Management  7 - For our office of Multifamily Production  8 - For our [employer] and Clinical biochemist office)  9 - For other options      Rental and Utility Assistance:    [employer] of Social Services  https://www.[place].com/dss/  [email or web address] -[email or web address]   [address] [place], [place] [address]  Phone [phone]   Emergency Assistance w/ utilities, Sales executive, Medicaid, etc.   Crisis Intervention Program (CIP), Low Income Energy Assistance Program ([place]), Low Income Household Water Assistance Program [place])    AY301  Call 211 to get connected to financial assistance agencies in your area or visit https://www.mckee-[email or web address]/    [facility] Agency   Address: [address] [place], [place] [address]   Phone: [phone]   Website: www.alamanceservices.org   Service(s) Offered: Housing services, self-sufficiency, congregate meal program, and individual development account program.     [employer] of [place]   Address: [address], [place], [place] [address]   Phone: [phone]   Email: info@alliedchurches .org   Website: www.alliedchurches.org   Service(s) Offered: Housing the homeless, feeding the hungry, Company secretary, job and education related services.     [facility]   Address: [address], [place], [place] [address]   Phone: [phone] or [phone]   Email: [email or web address]   Service(s) Offered: Family services and transient assistance; emergency food, fuel, clothing, limited furniture, utilities; budget counseling, general counseling; give a kid a coat; thrift store; Christmas food and toys. Utility assistance, food pantry, rental assistance, life sustaining medicine    Women's Resource Center: 717-728-5862     [facility] Senior Resources:    [facility]  The [facility] offers an array of activities for adults age 20 and over.    Address: [address] [place] [address]  Phone: [phone]  Website: BronzeElephant.fi   Hours: Monday - Friday, 8 am - 4  pm; Evening activities beginning at 6 pm, Monday - Thursday.     Meal Assistance  [place] [employer] on Wheels.  Call ([phone]) [phone] or email Casilda Carls at [email or web address] .[facility] Nutrition.  Call [phone] or email Quenten Raven at [email or web address] .[employer] ElderCare  Assistance with living at home independently, connection to community resources, case management, and many other services.  Website: https://alamanceeldercare.com/   Phone:  3406473992   Address: [address] [place], [place] [address]     Living Healthy at Home  Healthy at Home is a program offered at no cost and includes a tool kit that you will receive in the mail and weekly phone calls in small groups. The program is hosted by the [employer] on Aging. Contact MaryLou White at gewell@ptrc .com or 701-125-3945.     Dial-A-Ride: Dial-A-Ride is a transportation service for people 65 years of age or older. Call the Dial-A-Ride Administrator at ([phone] for information on eligibility, fare assistance, rides provided on a donation basis and other support made possible through federal and state grants.

## 2022-10-08 NOTE — Unmapped (Cosign Needed)
Physician Discharge Summary [facility]  CTSU [facility]  [address]  [place] [address]  Dept: [phone]  Loc: [phone]     Identifying Information:   Norma Green  15-Jan-1961  [id number]    Primary Care Physician: Jaci Carrel, ANP     Code Status: Full Code    Admit Date: 10/04/2022    Discharge Date: 10/08/2022     Discharge To: Home    Discharge Service: [facility] - General Medicine Floor Team MED Rhae Lerner     Discharge Attending Physician: Suzzanne Cloud, MD    Discharge Diagnoses:   Principal Problem:    Hematemesis (POA: Unknown)  Active Problems:    Pancreatic adenocarcinoma (CMS-HCC) (POA: Yes)    Melena (POA: Unknown)    Leukocytosis (POA: Unknown)  Resolved Problems:    * No resolved hospital problems. [facility] Course:   Hematology/Oncology History   Pancreatic adenocarcinoma (CMS-HCC)   12/13/2021 Initial Diagnosis    Pancreatic adenocarcinoma (CMS-HCC)     12/23/2021 -  Cancer Staged    Staging form: Exocrine Pancreas, AJCC 8th Edition  - Clinical: Stage IV (cM1) - Signed by Roni Bread, MD on 12/23/2021       12/26/2021 -  Chemotherapy    OP GI mFOLFIRINOX  oxaliplatin 85 mg/m2 IV on day 1, irinotecan 150 mg/m2 IV on day 1, leucovorin 400 mg/m2 IV on day 1, fluorouracil 2,400 mg/m2 CIVI over 46 hours on day 1, every 14 days       Norma Green is a 62 y.o. 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       GIB s/p EGD with MW tears I Hematemesis I Melena I Hematochezia   Patient had chemotherapy 2/9.  Afterwards, experienced some nausea and vomiting.  She does typically get nauseous and has some vomiting with her chemotherapy. However, she has not had hematemesis until night prior to admission. 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 On admission, Hb at 9.4 from 10.3 on the day prior with BUN:Cr ratio 22.  Patient had RRT 2/12 called for hematochezia.  Hb dropped to 5.4. Was taken to OR for EGD after 2 units of PRBC with repeat Hb improved. Was found to have two MW tears at GE junction, s/p 2 clips.  Of note, after procedure was found soiled in maroon blood during bed transfer. The patient's hemoglobin subsequently stabilized over the next couple days. The patient denies any additional bleeding episodes since 2/12. Hgb 8.2 on day of discharge.     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 She received 20 mg of Decadron day prior which could contribute to leukocytosis. However, given immunocompromised, infectious work up obtained in the ED. S/p Cefepime, Vanc, IVFLR in the ED. Chest x-ray with no opacities/infiltrates noted. RPP negative. Lipase wnl. Repeat lactate elevated during RRT 2/11, likely iso acute GIB, which improved after transfusion and IVF.  With increasing LFTs and alk phos, initial concern for metal stent occlusion/or seeding from biliary tract. CT A/P 2/11 with biliary stent occlusion and stent associated cholangitis. Blood cultures 1 out of 2 + E.coli iso stent associated cholangitis without urinary or other infectious source  for which she was continued on Zosyn. ERCP was performed on 2/12 with successful removal of stent obstruction and the patient was discharged on 2/14 with 4 days of PO levaquin to finish abx course.      Hx Pancreatic Adenocarcinoma with metastatic disease to liver  Initially presented with abdominal pain, weight loss 11/2021, and found to have CBD obstruction with both pancreatic head and tail mass along with 1 cm liver lesion. Had EUS/ERCP with CBD stent placement.  Biopsy of pancreatic tail mass with adenocarcinoma.  12/26/21 started FOLFIRINOX (Cy 1 FOLFOX). Last chemotherapy treatment 2/9. Med onc consulted and recommended to hold chemo pending GIB resolution. Reinitiation of chemotherapy to occur outpatient. Patient discharged with followup with Onc Palliative care.     HTN  Patient mildly hypertensive. Held antihypertensives in the setting of possible GIB. Continued to hold BP meds on discharge given stable blood pressures. Suggest reeval on hospital followup with PCP to determine appropriateness to restart.      The patient's hospital stay has been complicated by the following clinically significant conditions requiring additional evaluation and treatment or having a significant effect of this patient's care: - Anemia POA requiring further investigation or monitoring          Outpatient Provider Follow Up Issues:   [ ]  HTN: Antihypertensives held on discharge. Please assess medications and restart as appropriate    Touchbase with Outpatient Provider:  Warm Handoff: Completed on 10/08/22 by Ileene Musa, MD  (Intern) via [facility] Message    Procedures:  EGD and ERCP with biliary stent placement  ______________________________________________________________________  Discharge Medications:      Your Medication List        STOP taking these medications      amlodipine 10 MG tablet  Commonly known as: NORVASC     hydroCHLOROthiazide 25 MG tablet  Commonly known as: HYDRODIURIL     lisinopril 40 MG tablet  Commonly known as: PRINIVIL,ZESTRIL     metoPROLOL succinate 100 MG 24 hr tablet  Commonly known as: TOPROL XL            START taking these medications      levoFLOXacin 750 MG tablet  Commonly known as: LEVAQUIN  Take 1 tablet (750 mg total) by mouth daily for 4 days.            CONTINUE taking these medications      dexAMETHasone 4 MG tablet  Commonly known as: DECADRON  Take 2 tablets (8 mg total) by mouth daily. Take only on days 2, 3, and 4 of each 14-day chemotherapy cycle.     FLINTSTONES GUMMIES ORAL  Take 1 tablet by mouth daily.     lidocaine-prilocaine 2.5-2.5 % cream  Commonly known as: EMLA  Apply 30 minutes prior to port access.     loperamide 2 mg capsule  Commonly known as: IMODIUM  If having diarrhea, take 2 capsules (4 mg) after the first loose stool, then 1 capsule every 2 hours until diarrhea free for 12 hours.     oxyCODONE 5 MG immediate release tablet  Commonly known as: ROXICODONE  Take 1 tablet (5 mg total) by mouth every six (6) hours as needed.     polyethylene glycol 17 gram/dose powder  Commonly known as: GLYCOLAX  Take 17 g by mouth daily.     prochlorperazine 10 MG tablet  Commonly known as: COMPAZINE  Take 1 tablet (10 mg total) by mouth every six (6) hours as needed (nausea).  UDENYCA 6 mg/0.6 mL injection  Generic drug: pegfilgrastim-cbqv  Inject the contents of 1 syringe (6 mg) under the skin once on day 4 each chemotherapy cycle (24 hours after the completion of chemotherapy).              Allergies:  Venom-honey bee and Metronidazole  ______________________________________________________________________  Pending Test Results:  Pending Labs       Order Current Status    Hepatitis Panel, Acute In process    Blood Culture #2 Preliminary result            Most Recent Labs:  All lab results last 24 hours -   Recent Results (from the past 24 hour(s))   Type and Screen    Collection Time: 10/07/22  5:35 PM   Result Value Ref Range    ABO Grouping B POS     Antibody Screen NEG    Prepare RBC    Collection Time: 10/08/22 12:05 AM   Result Value Ref Range    Crossmatch Compatible     Unit Blood Type B Pos     ISBT Number 7300     Unit # [id number]     Status Released to Avail     Spec Expiration [id number]     Product ID Red Blood Cells     PRODUCT CODE [id number]     Crossmatch Compatible     Unit Blood Type B Pos     ISBT Number 7300     Unit # [id number]     Status Released to Avail     Spec Expiration [id number]     Product ID Red Blood Cells     PRODUCT CODE [id number]    Phosphorus Level    Collection Time: 10/08/22  9:46 AM   Result Value Ref Range    Phosphorus 2.3 (L) 2.4 - 5.1 mg/dL   Basic Metabolic Panel    Collection Time: 10/08/22  9:46 AM   Result Value Ref Range    Sodium 141 135 - 145 mmol/L    Potassium 3.3 (L) 3.4 - 4.8 mmol/L    Chloride 108 (H) 98 - 107 mmol/L    CO2 27.0 20.0 - 31.0 mmol/L    Anion Gap 6 5 - 14 mmol/L    BUN 12 9 - 23 mg/dL    Creatinine 4.74 2.59 - 1.02 mg/dL    BUN/Creatinine Ratio 15     eGFR CKD-EPI (2021) Female 86 >=60 mL/min/1.55m2    Glucose 124 70 - 179 mg/dL    Calcium 8.3 (L) 8.7 - 10.4 mg/dL   Hepatic Function Panel    Collection Time: 10/08/22  9:46 AM   Result Value Ref Range    Albumin 3.0 (L) 3.4 - 5.0 g/dL    Total Protein 5.9 5.7 - 8.2 g/dL    Total Bilirubin 0.6 0.3 - 1.2 mg/dL    Bilirubin, Direct 5.63 0.00 - 0.30 mg/dL    AST 23 <=87 U/L    ALT 94 (H) 10 - 49 U/L    Alkaline Phosphatase 167 (H) 46 - 116 U/L   CBC    Collection Time: 10/08/22  9:46 AM   Result Value Ref Range    WBC 6.2 3.6 - 11.2 10*9/L    RBC 2.77 (L) 3.95 - 5.13 10*12/L    HGB 8.2 (L) 11.3 - 14.9 g/dL    HCT 56.4 (L) 33.2 - 44.0 %    MCV 84.5 77.6 - 95.7 fL  MCH 29.5 25.9 - 32.4 pg    MCHC 34.9 32.0 - 36.0 g/dL    RDW 09.8 11.9 - 14.7 %    MPV 6.9 6.8 - 10.7 fL    Platelet 132 (L) 150 - 450 10*9/L       Relevant Studies/Radiology:  [facility] (No Interpretation)    Result Date: 10/06/2022  Interpretation will follow in operative report.    ERCP    Result Date: 10/06/2022  _______________________________________________________________________________ Patient Name: Dafne Torre           Procedure Date: 10/06/2022 4:09 PM MRN: [id number]                     Date of Birth: [birth year] Admit Type: Inpatient                 Age: 19 Room: GI [facility] OR 01 [facility]          Gender: Female Note Status: Finalized                Instrument Name: [id number] _______________________________________________________________________________  Procedure:             ERCP Indications:           Ascending cholangitis, For therapy of ascending                        cholangitis, Prior bile duct stent placement Providers:             TODD Gevena Barre, MD, Ellison Carwin, MD                        (Fellow), Mia [place], Blessing Jilda Roche,                        Technician Referring MD:          Medicines:             General Anesthesia Complications:         No immediate complications. _______________________________________________________________________________ Procedure:             Pre-Anesthesia Assessment:                        - Prior to the procedure, a History and Physical was                        performed, and patient medications and allergies were                        reviewed. The patient's tolerance of previous                        anesthesia was also reviewed. The risks and benefits                        of the procedure and the sedation options and risks                        were discussed with the patient. All questions were                        answered, and informed consent was obtained. Prior  Anticoagulants: The patient has taken no anticoagulant                        or antiplatelet agents. ASA Grade Assessment: III - A                        patient with severe systemic disease. After reviewing                        the risks and benefits, the patient was deemed in                        satisfactory condition to undergo the procedure.                        After obtaining informed consent, the scope was passed                        under direct vision. Throughout the procedure, the                        patient's blood pressure, pulse, and oxygen                        saturations were monitored continuously. The                        Duodenoscope was introduced through the mouth, and                        advanced to the duodenum and used to inject contrast                        into the bile duct. The ERCP was accomplished without                        difficulty. The patient tolerated the procedure well. Findings:      A scout film of the abdomen was obtained. One stent ending in the lower      third of the main bile duct was seen. Hemostatic clips were seen in the      area of the distal esophagus. The esophagus was successfully intubated      under direct vision without detailed examination of the pharynx, larynx,      and associated structures. The upper GI tract was traversed under direct      vision without detailed examination. Two endoclips were found at the      gastroesophageal junction. One covered metal biliary stent originating      in the biliary tree was emerging from the major papilla. The stent was      partially occluded. The bile duct was deeply cannulated with the 8.5 mm      balloon through the existing metal stent. Contrast was injected. I      personally interpreted the bile duct images. Ductal flow of contrast was      adequate. Image quality was excellent. Contrast extended to the entire      biliary tree. The intra-hepatic and extra-hepatic biliary duct system      was normal. There was no stenosis noted in the main bile duct. The  biliary tree was swept with an 8.5 mm balloon starting at the      bifurcation. Moderate amount of sludge was swept from the duct.      The biliary stent and the endoclips were present at the end of the      procedure.                                                                                Estimated Blood Loss:      Estimated blood loss: none. Impression:            - An endoclip was found in the esophagus.                        - One partially occluded stent from the biliary tree                        was seen in the major papilla.                        - The biliary tree was swept and sludge was found.                        - No specimens collected. Recommendation:        - Return patient to hospital ward for ongoing care.                        - Resume previous diet. - Continue present medications.                                                                                Procedure Code(s):     --- Professional ---                        [phone], Endoscopic retrograde cholangiopancreatography                        (ERCP); with removal of calculi/debris from                        biliary/pancreatic duct(s)                        [phone], Endoscopic catheterization of the biliary                        ductal system, radiological supervision and                        interpretation Diagnosis Code(s):     --- Professional ---  Z97.8, Presence of other specified devices                        T85.590A, Other mechanical complication of bile duct                        prosthesis, initial encounter                        K83.09, Other cholangitis                        Z98.890, Other specified postprocedural states CPT copyright 2022 American Medical Association. All rights reserved. The codes documented in this report are preliminary and upon coder review may be revised to meet current compliance requirements. Attending Participation:      I was present and participated during the entire procedure, including      non-key portions.                                                                                Electronically Signed By Corliss Parish, MD ______________________ TODD Gevena Barre, MD 10/06/2022 4:49:04 PM The attending physician was present throughout the entire procedure including the insertion, viewing, and removal of the endoscope. This procedure note has been electronically signed by: Corliss Parish , MD _________________________ Ellison Carwin, MD Number of Addenda: 0 Note Initiated On: 10/06/2022 4:09 PM    Upper Endoscopy    Result Date: 10/06/2022  _______________________________________________________________________________ Patient Name: Jaelene Dech           Procedure Date: 10/05/2022 6:01 PM MRN: [id number] Date of Birth: [birth year] Admit Type: Inpatient                 Age: 12 Room: MAIN OR 15 [facility]                 Gender: Female Note Status: Finalized                Instrument Name: [id number] _______________________________________________________________________________  Procedure:             Upper GI endoscopy Indications:           Hematemesis Providers:             Dub Mikes, MD, Jeanann Lewandowsky                        (Fellow) Referring MD:          Medicines:             General Anesthesia Complications:         No immediate complications. _______________________________________________________________________________ Procedure:             Pre-Anesthesia Assessment:                        - Prior to the procedure, a History and Physical was                        performed, and patient medications and allergies  were                        reviewed. The patient's tolerance of previous                        anesthesia was also reviewed. The risks and benefits                        of the procedure and the sedation options and risks                        were discussed with the patient. All questions were                        answered, and informed consent was obtained. Prior                        Anticoagulants: The patient has taken no anticoagulant                        or antiplatelet agents except for aspirin. ASA Grade                        Assessment: IV - A patient with severe systemic                        disease that is a constant threat to life. After                        reviewing the risks and benefits, the patient was                        deemed in satisfactory condition to undergo the                        procedure.                        - Prior Aspirin/ NSAID therapy: The patient has taken                        aspirin, last dose was 1 day prior to procedure.                        After obtaining informed consent, the endoscope was passed under direct vision. Throughout the procedure,                        the patient's blood pressure, pulse, and oxygen                        saturations were monitored continuously. The Endoscope                        was introduced through the mouth, and advanced to the                        second part of duodenum. The upper GI endoscopy was  accomplished without difficulty. The patient tolerated                        the procedure well.                                                                                Findings:      Two small bleeding Mallory-Weiss tears with contact oozing were found.      An initial hemostatic clip was placed on one with significant oozing      immediately after placement. The area was then successfully injected      with 3 mL of a 0.1 mg/mL solution of epinephrine for hemostasis. A      second hemostatic clip was placed successfully on the area of a second      Mallory-Weiss tear. Clip manufacturer: [employer]. Given      significant oozing after initial placement, there was concern for      potential varix underneath the Mallory-Weiss. While this was querried,      there was no overt stigmata of variceal bleed (red wale, nipple). There      was no bleeding at the end of the procedure.      A small hiatal hernia was present.      The exam of the esophagus was otherwise normal.      The entire examined stomach was normal.      A previously placed metal stent was seen in the second portion of the      duodenum. No evidence of bleeding.      The examined duodenum was normal.                                                                                Impression:            - Two Mallory-Weiss tears. Injected. Clips (MR                        conditional) were placed. Clip manufacturer: [employer].                        - Small hiatal hernia.                        - Normal stomach.                        - Metal stent in the duodenum.                        - Normal examined duodenum.                        -  No specimens collected. Recommendation:        - Return patient to hospital ward for ongoing care.                        - Continue IV PPI.                        - NPO today.                        - Continue present medications.                                                                                Procedure Code(s):     --- Professional ---                        [phone], Esophagogastroduodenoscopy, flexible,                        transoral; with control of bleeding, any method Diagnosis Code(s):     --- Professional ---                        K22.6, Gastro-esophageal laceration-hemorrhage syndrome                        K44.9, Diaphragmatic hernia without obstruction or                        gangrene                        K92.0, Hematemesis CPT copyright 2022 American Medical Association. All rights reserved. The codes documented in this report are preliminary and upon coder review may be revised to meet current compliance requirements. Attending Participation:      I was present and participated during the entire procedure, including      non-key portions.                                                                                Electronically Signed By Lowella Curb ________________________________ Dub Mikes, MD 10/05/2022 8:13:53 PM The attending physician was present throughout the entire procedure including the insertion, viewing, and removal of the endoscope. This procedure note has been electronically signed by: Lowella Curb , MD ______________ Jeanann Lewandowsky, Number of Addenda: 0 Note Initiated On: 10/05/2022 6:01 PM    XR Abdomen 1 View    Result Date: 10/05/2022  EXAM: XR ABDOMEN 1 VIEW ACCESSION: [id number] UN CLINICAL INDICATION: 62 years old with ileus vs sbo reassess ; OTHER  COMPARISON: Same day CT of the abdomen and pelvis. TECHNIQUE: Supine view of the abdomen. FINDINGS/IMPRESSION: Incompletely-visualized central venous  catheter with tip projecting over the superior cavoatrial junction. Nonobstructive bowel gas pattern. Mild volume colonic stool burden. Common bile duct stent remains in place. Excreted contrast is seen projecting over the renal collecting systems, bilaterally. Multilevel degenerative changes are seen throughout the spine.    CT Abdomen Pelvis W Contrast    Result Date: 10/05/2022  EXAM: CT ABDOMEN PELVIS W CONTRAST ACCESSION: [id number] UN CLINICAL INDICATION: 62 years old with 46 F with pancreatic cancer now with GNR bacteremia  COMPARISON: 09/05/2022 TECHNIQUE: A helical CT scan of the abdomen and pelvis was obtained following IV contrast from the lung bases through the pubic symphysis. Images were reconstructed in the axial plane. Coronal and sagittal reformatted images were also provided for further evaluation. FINDINGS: LOWER CHEST: Unchanged linear atelectasis/scarring in the left lower lobe. Bibasilar dependent atelectasis. LIVER: Normal liver contour. Numerous scattered hypoattenuating subcentimeter lesions throughout the hepatic parenchyma, stable from prior. The focal area of enhancement seen in the hepatic segment VII (2:38) appears stable in size, measuring 1.4 cm with increased central hypodensity. BILIARY: Small volume gas within the gallbladder and the cystic duct. No pericholecystic fluid, gallbladder wall thickening, or stranding. Increased pneumobilia, particularly peripherally. There is at least partial occlusion of the common bile duct biliary stent with increased dilatation of the common bile duct surrounding the stent measuring up to 1.9 cm with wall enhancement and distal tapering (2:30). There is surrounding stranding. There is abnormal heterogenous attenuation within the proximal common bile duct (2: 27). Interval increased mild dilation of the intrahepatic bile ducts. SPLEEN: Normal in size and contour. PANCREAS: Normal pancreatic contour. . Pancreatic mass within the pancreatic body/tail measures approximately 1.3 cm greatest dimension (2:32, 3:31), previously 1.2 cm. No ductal dilatation. ADRENAL GLANDS: Stable hypodense 2.1 cm nodule within the left adrenal gland, previously 2.1 cm (2:24). Right adrenal gland is normal in appearance. KIDNEYS/URETERS: Symmetric renal enhancement.  No hydronephrosis.  No solid renal mass. BLADDER: Unremarkable. REPRODUCTIVE ORGANS: Retroverted, retroflexed uterus. No suspicious adnexal lesions. GI TRACT: No findings of bowel obstruction or acute inflammation. Normal-appearing retrocecal appendix. Mild scattered colonic diverticulosis without evidence of diverticulitis. PERITONEUM, RETROPERITONEUM AND MESENTERY: No free air.  No ascites.  No fluid collection. LYMPH NODES: No adenopathy by size criteria. Stable 6 mm gastrohepatic node (2:25). VESSELS: Hepatic and portal veins are patent. Normal caliber aorta with minimal aortoiliac calcifications. 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 BONES and SOFT TISSUES: Less conspicuous sclerotic focus within the T12 vertebral body. Small fat-containing umbilical hernia. No aggressive osseous lesions.  No focal soft tissue lesions.     Proximal common bile duct dilatation surrounding the biliary stent with wall enhancement and heterogenous intraluminal attenuation. Hypoattenuating material within the biliary stent with at least partial occlusion distally and near complete/complete occlusion proximally. There is also increased pneumobilia and intrahepatic biliary ductal dilation. 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 Stable size of the lesion within hepatic segment VII with interval increased central hypodensity. This may represent a metastatic focus and attention on follow-up is recommended. Stable size/appearance of the pancreatic body/tail lesion and the left adrenal nodule.     [place] RUQ W Gallbladder    Result Date: 10/04/2022  EXAM: [place] RUQ W GALLBLADDER ACCESSION: [id number] UN CLINICAL INDICATION: 62 years old with Elevated liver enzymes and leukocytosis  COMPARISON: CT abdomen pelvis September 05, 2022 TECHNIQUE: Static and cine images of the right upper quadrant were performed. FINDINGS: LIVER: The liver was normal in echogenicity.  No focal hepatic lesions. No biliary ductal dilatation. Pneumobilia.      Liver: 16.4 cm      Common bile duct: 0.38 cm GALLBLADDER: The gallbladder was physiologically distended with layering sludge. Sonographic Murphy sign was negative.  No pericholecystic fluid. No gallbladder wall thickening. Poorly visualized known common bile duct stent.      Gallbladder wall: 0.16 cm LIMITED RIGHT KIDNEY: No hydronephrosis. OTHER: The known pancreatic mass is not well visualized on this exam. Please see below for data measurements: Liver: 16.4 cm Gallbladder wall: 0.16 cm Sonographic Murphy sign was negative. Pericholecystic fluid visualized: no Common bile duct: 0.38 cm     -Physiologically distended gallbladder with layering sludge. No gallstone or biliary dilation. No evidence of acute cholecystitis. -Pneumobilia, expected with known common bile duct stent. ==================== MODIFIED REPORT: (10/04/2022 12:11 PM) This report has been modified from its preliminary version; you may check the prior versions of radiology report, results history link for prior report versions (if they were previously visible in Epic). -----------------------------------------------     ECG 12 Lead    Result Date: 10/04/2022  NORMAL SINUS RHYTHM LOW VOLTAGE QRS CANNOT RULE OUT ANTEROSEPTAL INFARCT (CITED ON OR BEFORE 03-Dec-2021) ABNORMAL ECG WHEN COMPARED WITH ECG OF 21-Dec-2021 13:28, QRS VOLTAGE HAS DECREASED QUESTIONABLE CHANGE IN INITIAL FORCES OF LATERAL LEADS ST NO LONGER ELEVATED IN ANTERIOR LEADS T WAVE INVERSION NOW EVIDENT IN ANTERIOR LEADS QT HAS SHORTENED Confirmed by Eldred Manges ([id number]) on 10/04/2022 11:36:28 AM    XR Chest 2 views    Result Date: 10/04/2022  EXAM: XR CHEST 2 VIEWS ACCESSION: [id number] UN CLINICAL INDICATION: Hematemesis ; OTHER  TECHNIQUE: PA and Lateral Chest Radiographs. COMPARISON: 12/21/2021 FINDINGS: Right internal jugular port catheter terminates in the superior cavoatrial junction. Lungs are clear.  No pleural effusion or pneumothorax. Normal heart size and mediastinal contours.     Negative chest.   ______________________________________________________________________  Discharge Instructions:   Activity Instructions       Activity as tolerated                  Resources and Referrals    To apply for section 8 housing assistance:  [facility]  [address],   [place], [place] [address]  [phone]    Contact Medicare:  1-800-MEDICARE [phone])231-0148)  For assistance with enrolling in a Medicare Plan    [facility] Department of Social Services Financial Resources    The Low Income Household Water Assistance Program [facility]) is a federally-funded program that provides emergency assistance to low-income households for drinking water and wastewater services.     -HOW TO APPLY FOR LIHWAP ASSISTANCE:    Apply Online at: [email or web address]  Phone: You can apply by phone by calling [phone]  Paper/ In-person: Please feel free to visit [facility] of Social Services located at [address] in [place], [place] [address].      The Low Income Energy Assistance Program (LIEAP) is a federally-funded program that provides a one-time payment to help eligible households pay their heating bills.    -HOW TO APPLY FOR (LIEAP) ASSISTANCE:    Apply Online at: [email or web address]  Phone: You can apply by phone by calling [phone]  Paper/ In-person: Please feel free to visit [facility] of Social Services located at [address] in [place], [place] [address].      The [place] [employer]. is located at [address] in [place], [place] [address].  Their telephone number is [phone]. Case managers will offer support to those that are in need.   Psychologist, forensic located at [employer] in [place], [place]. Their phone number is [phone]. They offer cash assistance to those in need of help with paying their utility bills, heating fuel, and they can also distribute prescription medication vouchers.   [place] 211 is an information and referral service provided by [employer] of [place]. Families and individuals can dial 2-1-1 or [phone] to obtain free and confidential information on health and human services and resources within their community.    [place] RESOURCE LIST    Transportation    1. Medicaid Transportation:  Individuals who have Medicaid insurance are eligible for assistance w/ transportation to non-emergency medical appointments. You must call ahead of time to schedule a ride, typically 2-3 business days ahead of time depending on the service. You cannot call day-of.   Contact:  Managed Medicaid Plans:  [facility] Caritas  [phone]  [facility] Complete  [phone]  Healthy Blue  7862064792  [facility] Plan  [phone]  [facility]  [phone]  Tailored Medicaid Plans:  [facility]  [phone]  [place]  [phone]  [facility]  [phone]  [facility]  [phone]  [facility] Resources  [phone]  [facility]  [phone]   Standard Medicaid ([place] Medicaid, Medicaid [place] Access)  Contact your local Department of Social Services Office: For [facility] residents, call [phone]     2. [place] [employer] (ACTA):  ACTA provides non-emergency transport for residents of [employer] to doctors appointments, errands, work, Catering manager. Also provides a service for residents 60 years and older (Dial-A-Ride)  ACTA Main Phone: [phone]   Website: [email or web address]   Dial-A-Ride: transportation service for people 47 years of age or older. Some financial assistance may be available. Call the Dial-A-Ride Administrator at [phone] for information and to enroll.    Questions about [employer] and Services:  Who Can Ride?  Anyone requiring transportation in [place] is eligible to ride the [employer]. ACTA provides transportation for general purpose trips, medical trips, and almost any non-emergency trip destination. In addition, special programs and pricing are available to qualified riders based on eligibility requirements.    How Do I Schedule A Ride?  To request transportation service, call the ACTA office at [phone] between the hours of 8:00 a.m. and 5:00 p.m., Monday-Friday. Rides are scheduled on a first come, first serve basis and must be made no later than 11:00am the working day before a trip and based on bus availability. Ride requests are not taken during the weekend. Requests for transportation on Mondays must be made at the latest by 11:00am the preceding Friday. Once capacity is reached, additional requests for trips will be deferred to later date options.     What Information is Needed?  You will need to provide the following information when you call:  Your name  Your address  Your telephone number  The appointment date  The appointment time  Your destination address  Your return time  Special requirements (i.e., child seat, wheelchair)  The name and relationship of anyone traveling with you    When Can I Ride?  Monday-Friday, 5:00 AM - 5:30 PM (no trip request is started after 5:00 PM). Riders should be ready to be picked up based on the Trip Times below.    Trip Times  All riders can expect a ride time of no more than  60 minutes aboard; generally the ride time will be less than 60 minutes.  Buses are not permitted to wait on riders over 3 minutes once the bus arrives for pick-up. The driver will blow the horn upon arrival and (if safe) knock on your door shortly after if you do not respond to the horn. If no response, you will be marked no show and the bus depart. Three no shows in 60-days will result in riding privileges suspension.  Going to Work, Aeronautical engineer - Riders going to work, doctor, or dialysis appointments may be dropped off up to 30 minutes ahead of their scheduled drop-off times. Pick-up time may therefore be up to 90 minutes before appointments (30 minutes early drop off + 60 max. ride time). This is the absolute maximum time with shorter intervals more often being the case.  General trips (Shopping, etc.) - You will be picked-up generally at your scheduled pick-up time. However, ACTA may pick you up between 30 minutes before or 30 minutes after the scheduled pick-up. This flexibility is needed to accommodate other riders during the day. For your return trip home, ACTA will either pick-up at your scheduled time or no later than 30 minutes afterwards.  Return trips from Work- You will be picked-up at your scheduled pick-up time but no later than 30 minutes afterwards. You will not be picked-up any earlier than your scheduled return pick-up time.  Return trips that are Will Call (Medical Trips) - You will be picked up as soon as a bus becomes available to pick you up. While ACTA would like for this pick-up to be as quick as possible after receiving your call, it could take up to 60 minutes once you contact ACTA for your vehicle to arrive.  OE, Friendship, Group Trips- Riders will be placed on daily routes with close to the same pick-up and drop-off times as can be arranged. Riders should be ready early in the event buses are running ahead of schedule.    What is Dial-A-Ride?  Dial-A-Ride is a transportation service for people 36 years of age or older. Call the Dial-A-Ride Administrator at [phone] for information on eligibility, fare assistance, rides provided on a donation basis and other support made possible through federal and state grants    How Do I Cancel My Ride?  If you must cancel a scheduled trip, please notify the ACTA office at [phone]. Any cancellations should be made by 11:00 am the business day before your ride. Otherwise, it will be considered a no-show. If you receive 3 no-shows within a 60-day period, you may be suspended from services for 30 days.    How Much Does it Cost?  Within [facility] Public:  One-Way - $5.00                                                           Round Trip - $10.00  URBAN General Public:                              One-Way - $5.00                             Round Trip - $10.00  Out of county non-emergency medical trips can be arranged by calling the [employer]. Payment for transportation service must be made to the driver.  ACTA accepts cash, checks, or money orders in the exact amount of the fare. The driver can issue a receipt for the cash amount collected. Required attendant will not be charged. All others will be charged regular fares.Packets of ten (10) tickets may be purchased in advance by contacting the ACTA office and are strongly encouraged.  All prices are subject to change without notice.      [place] [employer] Life Assistance:    Catholic Charities:  Phone: 562-609-1955   https://www.catholiccharitiesraleigh.org/dcfp-programs/  Resources: Museum/gallery curator, Corporate investment banker, Case Management, Emergency Utility Assistance.  - [employer] offers emergency financial assistance for utility bills such as electric, gas, or water. Funds are limited and assistance is administered on a first come first served basis. If you need assistance, please call [phone] or email [email or web address] [email or web address]   [address] [facility]):   [address], [place] [address]  [phone]    CityGate Dream Center:  https://citygatedreamcenter.com/  https://citygatedreamcenter.com/programs/  Museum/gallery curator, Emergency Financial Assistance, Family Programs, and many more resources!     [address], [place], [place] [address]  [phone]    KZ601  Call 211 to get connected to financial assistance agencies in your area or visit https://www.mckee-[email or web address]/  If you need help finding assistance with housing, food, healthcare, utility payments, and more, we can help. 2-1-1 Referral Specialists are available 24 hours a day, every day by dialing 2-1-1 or 8038834691 from any phone. The call is free, confidential, and available in any language.      [facility] Department of Social Services  https://www.[place]s.com/dss/  [email or web address] -RecruitSuit.[email or web address]   [address] [place], [place] [address]  Phone [phone]   Emergency Assistance w/ utilities, Food Stamps, Medicaid, etc.   Crisis Intervention Program (CIP), Low Income Energy Assistance Program ([place]), Low Income Household Water Assistance Program (LIHWAP)      [facility] Agency   Address: [address] [place], [place] [address]   Phone: [phone]   Website: www.alamanceservices.org   Service(s) Offered: Housing services, self-sufficiency, congregate meal program, and individual development account program.     [employer] of [place]   Address: [address], [place], [place] [address]   Phone: [phone]   Email: info@alliedchurches .org   Website: www.alliedchurches.org   Service(s) Offered: Housing the homeless, feeding the hungry, Company secretary, job and education related services.     [facility]   Address: [address], [place], [place] [address]   Phone: (205)[phone] or [phone]   Email: [email or web address]   Service(s) Offered: Family services and transient assistance; emergency food, fuel, clothing, limited furniture, utilities; budget counseling, general counseling; give a kid a coat; thrift store; Christmas food and toys. Utility assistance, food pantry, rental assistance, life sustaining medicine    Women's Resource Center: ([phone]   Emergency Financial Assistance when available.    [place] [employer] Food Assistance:    [facility] Department of Social Services  https://www.[place].com/dss/  [email or web address] -RecruitSuit.[email or web address]   [address] [place], [place] [address]  Phone [phone]   Food Stamps.    [place] Family Empowerment (SAFE) Food Pantry:  ([phone])[phone]    S.A.F.E. Food Ministry is open from 9am-12pm on Tuesdays and Saturdays except the first & fifth Saturday of every month.  ProtectionPoker.ch    City[facility] Dream Center:  https://citygatedreamcenter.com/  https://citygatedreamcenter.com/programs/  Museum/gallery curator, Emergency Financial Assistance, Family Programs, and many more resources!     [address] [place], [place] [address]  [phone]    Other Community Agencies/churches that provide food assistance:  [facility] [phone]   [facility] Medco Health Solutions Agency 7025212552   Caring Kitchen [phone] Dream Center [phone]   [facility] Meals on Wheels [phone]   [facility] (age 31+) [phone]   Allied Churches [phone]   [place] [phone]   [facility] Sacrament/Little Portions Food Pantry [phone]   [address] of [place] [phone]   DreamAlign [phone]   [facility] [phone] [employer] of [place] [phone]   First [facility] [employer] [phone]   [facility] [phone]   [facility] [phone] [employer] of [place] [phone]   [address]ning [address] [place] [phone]   New Life at [address] [facility] [phone]   [place] System [phone]    SAFE [phone]    [facility] Army [phone]    Clarisa Fling [phone]   [facility] Center [phone]     [facility] Housing Resources:    Emergency Shelters:  VE720  Call 211 to get connected to emergency shelter in your area    [place] [phone] (men with substance abuse issues)   Allied Churches: [phone]) [phone] (night shelter for homeless women, men, families)     Eviction & Landlord Mediation    Legal Aid of [place]  Housing Helpline: (579)571-6595  Assistance w/ issues w/: eviction, landlord refusing housing assistance, repairs/maintenance, public housing and section 8, and other landlord-tenant issues.    Affordable Housing / Hospital doctor / Assistance    [employer] Rx 6678670131    Kelly Services [phone]    NCHousingSearch.org  Search Tool for Affordable/low-income housing: http://www.davis-wright.info/  For help searching, please call [phone] (toll free) Monday-Friday, 9:00 a.m. - 8:00 p.m      HUD Affordable Housing Search  ClosetRepublicans.fi  You can use this map to find a privately owned apartment with reduced rents.  To apply: contact the apartment management office.     HUD Housing Counseling Service  The nationwide network of HUD participating housing counseling agencies have been helping consumers across [place] for more than 50 years by providing the answers they need to make informed housing decisions.  Use the search tool to find a local HUD participating housing counseling agency near you: https://hudgov-answers.https://www.warren.com/  or call toll-free (312)866-7920      HUD [facility] Office  (Serves all of [place])  Assistance with locating affordable housing, avoiding foreclosure, help to stay in your home, etc.  http://www.lowery.com/  Contact us by Phone: To reach our office, you may call us at [phone]. You will be prompted to choose from the following selections:  Enter the extension of the person you are calling if you know their extension or press:  1 - If you know the last name of the person you are calling  3 - For Public Housing/Section 8 inquiries  4 - For our office of Community Planning  and Development  5 - For our office of Fair Housing and Equal Opportunity  6 - For our office of Multifamily Asset Management  7 - For our office of Multifamily Production  8 - For our [employer] and Clinical biochemist office)  9 - For other options      [employer] and Utility Assistance:    [facility] Department of Social Services  https://www.[place].com/dss/  [email or web address] -RecruitSuit.[email or web address]   [address] [place], [place] [address]  Phone [phone]   Emergency Assistance w/ utilities, Sales executive, Medicaid, etc.   Crisis Intervention Program (CIP), Low Income Energy Assistance Program ([place]), Low Income Household Water Assistance Program [place])    BJ478  Call 211 to get connected to financial assistance agencies in your area or visit https://www.mckee-gibson.com/    [facility] Agency   Address: [address] [place], [place] [address]   Phone: [phone]   Website: www.alamanceservices.org   Service(s) Offered: Housing services, self-sufficiency, congregate meal program, and individual development account program.     [employer] of [place]   Address: [address] [place], [place] [address]   Phone: [phone]   Email: info@alliedchurches .org   Website: www.alliedchurches.org   Service(s) Offered: Housing the homeless, feeding the hungry, Company secretary, job and education related services.     [facility]   Address: [address] [place], [place] [address]   Phone: (509)[phone] or ([phone]   Email: [email or web address]   Service(s) Offered: Family services and transient assistance; emergency food, fuel, clothing, limited furniture, utilities; budget counseling, general counseling; give a kid a coat; thrift store; Christmas food and toys. Utility assistance, food pantry, rental assistance, life sustaining medicine    Women's Resource Center: [phone]     [facility] Senior Resources:    [facility]  The [facility] offers an array of activities for adults age 103 and over.    Address: [address] [place] [address]  Phone: ([phone]  Website: BronzeElephant.fi   Hours: Monday - Friday, 8 am - 4 pm; Evening activities beginning at 6 pm, Monday - Thursday.     Meal Assistance  [place] [employer] on Wheels.  Call (541)436-7046 or email Casilda Carls at [email or web address] .[facility] Nutrition.  Call [phone] [phone] or email Quenten Raven at [email or web address]    [place] [facility]Care  Assistance with living at home independently, connection to community resources, case management, and many other services.  Website: https://alamanceeldercare.com/   Phone:  (810)181-8589   Address: [address] [place], [place] [address]     Living Healthy at Home  Healthy at Home is a program offered at no cost and includes a tool kit that you will receive in the mail and weekly phone calls in small groups. The program is hosted by the [employer] on Aging. Contact MaryLou White at gewell@ptrc .com or (336) F7354038.     Dial-A-Ride: Dial-A-Ride is a transportation service for people 25 years of age or older. Call the Dial-A-Ride Administrator at ([phone] for information on eligibility, fare assistance, rides provided on a donation basis and other support made possible through federal and state grants.            Follow Up instructions and Outpatient Referrals     Ambulatory referral to Palliative Care      Is this a palliative referral for: coping support  [place] PALLIATIVE CARE [place] (Non-Cancer Diagnosis)    [place] PALLIATIVE CARE ONCOLOGY [place] (Cancer Diagnosis)    [place] [facility] CAN[facility]ER [facility] [place] (Cancer Diagnosis - [place])      [place] [facility]ITY PALLIATIVE HOME CARE (Home-Based)    [facility] CHILDRENS PALLIATIVE CARE [place] (Pediatrics)    *For [facility] Palliative Care Programs, contact the organization directly   for external referral process     Call MD for:  difficulty breathing, headache or visual disturbances      Call MD for:  persistent nausea or vomiting      Call MD for:  severe uncontrolled pain      Call MD for:  temperature >38.5 Celsius      Discharge instructions          Appointments which have been scheduled for you      Oct 17, 2022  9:15 AM  (Arrive by 8:45 AM)  LEVEL 330 with [employer] CHAIR 06  [place] [facility]COL[facility] INFUSION [facility] REGION) [address]  [place] [address]  [phone]        Oct 19, 2022 12:30 PM  (Arrive by 12:00 PM)  PUMP DISCONNECT with [employer] CHAIR 06  [place] [facility]COLOG[facility] INFUSION [facility] REGION) [address]  [place] [address]  [phone]        Oct 31, 2022  8:20 AM  (Arrive by 8:05 AM)  CT ABDOMEN PELVIS W CONTRAST with [place] CT RM 1  IMG CT [place] [address]  [facility] 1 through 4  [place] [address]  [phone]   On appt date:  Drink lots of water 24 hrs  Bring recent lab work  Take meds as usual  [employer] fast streamer of current meds  Bring snack if diabetic    Let us know if pt:  Allergic to contrast dyes  Diabetic  Pregnant or nursing  Claustrophobic    (Title:CTWCNTRST)         Oct 31, 2022  9:30 AM  (Arrive by 9:00 AM)  NURSE LAB DRAW with ADULT ONC LAB  [facility] ADULT ONCOLOGY LAB DRAW STATION [facility] REGION) [address]  [place] [address]  [phone]        Oct 31, 2022 10:30 AM  (Arrive by 10:00 AM)  RETURN ACTIVE [place] with Tammy Tobey Bride, ANP  [place] ONCOLOGY MULTIDISCIPLINARY 2ND FLR CANCER HOSP [facility] REGION) [address]  [place] [address]  [phone]        Oct 31, 2022 11:00 AM  (Arrive by 10:30 AM)  LEVEL 360 with ONCINF CHAIR 40  [place] ONCOLOG[facility] INFUSION [facility] REGION) [address]  [place] [address]  [phone]        Nov 14, 2022  7:30 AM  (Arrive by 7:00 AM)  LEVEL 360 with ONCINF CHAIR 21  [place] [facility]COLOGY INFUSION [facility] REGION) [address]  [place] [address]  [phone]             ______________________________________________________________________  Discharge Day Services:  BP 124/61  - Pulse 77  - Temp 37.2 ??C (98.9 ??F) (Oral)  - Resp 24  - Ht 170.2 cm (5' 7)  - Wt 74.7 kg (164 lb 10.9 oz)  - SpO2 98%  - BMI 25.79 kg/m??     Pt seen on the  day of discharge and determined appropriate for discharge.    Condition at Discharge: good    Length of Discharge: I spent greater than 30 mins in the discharge of this patient.

## 2022-10-09 DIAGNOSIS — C259 Malignant neoplasm of pancreas, unspecified: Principal | ICD-10-CM

## 2022-10-09 MED ORDER — DEXAMETHASONE 4 MG TABLET
ORAL_TABLET | Freq: Every day | ORAL | 5 refills | 6 days | Status: CP
Start: 2022-10-09 — End: ?

## 2022-10-09 NOTE — Unmapped (Signed)
Patient [facility] Cancer Support Encounter    Contacted by Asa Saunas, with a request to connect with [name] about support groups. Attempted to call [name], but no answer. Voicemail box not function. Did send follow-up MyChart message with two support group options. Also sent my direct contact information in case [name] would like to discuss other options as well.     [name], BSN, RN, [facility]   She/Her/Hers  Oncology Nurse Navigator - [place] [facility]  [address], [place], [place] [address]    Center: [phone]  Voicemail: [phone]  [email or web address]

## 2022-10-09 NOTE — Unmapped (Signed)
RN Navigator contacted pt to check on status post hospital discharge. No answer. Reached identified VM. Left message and encouraged call back if needed.

## 2022-10-09 NOTE — Unmapped (Signed)
Hi,    Norma Green called requesting a medication refill for the following:    Medication: dexAMETHasone (DECADRON)   Dosage: 4 MG tablet   Days left of medication: 0  Pharmacy: [facility] [address] ([address]), [place] - [address]  [address] [place]) [place] [address]  Phone: [phone]  Fax: [phone]         The expected turnaround time is 3-4 business days           Thank you,  Warnell Bureau  [facility] Cancer Communication Center  [phone]

## 2022-10-15 NOTE — Unmapped (Signed)
Patient [facility] Cancer Support Encounter    Attempted connecting with Ms. Masini to follow up on request for support groups. Did send some support group options through MyChart previously. Left voicemail with my name, role, and direct callback number.     [name], BSN, RN, [facility]   She/Her/Hers  Oncology Nurse Navigator - [place] [facility]  [facility]  [address] [place], [place] [address]    Center: [phone]  Voicemail: [phone]  [email or web address] [email or web address]

## 2022-10-17 ENCOUNTER — Ambulatory Visit: Admit: 2022-10-17 | Discharge: 2022-10-18 | Payer: MEDICAID

## 2022-10-17 DIAGNOSIS — C259 Malignant neoplasm of pancreas, unspecified: Principal | ICD-10-CM

## 2022-10-17 LAB — CBC W/ AUTO DIFF
BASOPHILS ABSOLUTE COUNT: 0 10*9/L (ref 0.0–0.1)
BASOPHILS RELATIVE PERCENT: 0.7 %
EOSINOPHILS ABSOLUTE COUNT: 0.1 10*9/L (ref 0.0–0.5)
EOSINOPHILS RELATIVE PERCENT: 1.7 %
HEMATOCRIT: 25.7 % — ABNORMAL LOW (ref 34.0–44.0)
HEMOGLOBIN: 8.4 g/dL — ABNORMAL LOW (ref 11.3–14.9)
LYMPHOCYTES ABSOLUTE COUNT: 1.4 10*9/L (ref 1.1–3.6)
LYMPHOCYTES RELATIVE PERCENT: 26.1 %
MEAN CORPUSCULAR HEMOGLOBIN CONC: 32.6 g/dL (ref 32.0–36.0)
MEAN CORPUSCULAR HEMOGLOBIN: 27.9 pg (ref 25.9–32.4)
MEAN CORPUSCULAR VOLUME: 85.5 fL (ref 77.6–95.7)
MEAN PLATELET VOLUME: 7.3 fL (ref 6.8–10.7)
MONOCYTES ABSOLUTE COUNT: 0.6 10*9/L (ref 0.3–0.8)
MONOCYTES RELATIVE PERCENT: 10.3 %
NEUTROPHILS ABSOLUTE COUNT: 3.3 10*9/L (ref 1.8–7.8)
NEUTROPHILS RELATIVE PERCENT: 61.2 %
PLATELET COUNT: 212 10*9/L (ref 150–450)
RED BLOOD CELL COUNT: 3.01 10*12/L — ABNORMAL LOW (ref 3.95–5.13)
RED CELL DISTRIBUTION WIDTH: 16.7 % — ABNORMAL HIGH (ref 12.2–15.2)
WBC ADJUSTED: 5.5 10*9/L (ref 3.6–11.2)

## 2022-10-17 LAB — COMPREHENSIVE METABOLIC PANEL
ALBUMIN: 3.4 g/dL (ref 3.4–5.0)
ALKALINE PHOSPHATASE: 127 U/L — ABNORMAL HIGH (ref 46–116)
ALT (SGPT): 33 U/L (ref 10–49)
ANION GAP: 6 mmol/L (ref 5–14)
AST (SGOT): 20 U/L (ref <=34)
BILIRUBIN TOTAL: 0.7 mg/dL (ref 0.3–1.2)
BLOOD UREA NITROGEN: 13 mg/dL (ref 9–23)
BUN / CREAT RATIO: 17
CALCIUM: 8.9 mg/dL (ref 8.7–10.4)
CHLORIDE: 107 mmol/L (ref 98–107)
CO2: 29 mmol/L (ref 20.0–31.0)
CREATININE: 0.75 mg/dL
EGFR CKD-EPI (2021) FEMALE: >90 mL/min/{1.73_m2} (ref >=60)
GLUCOSE RANDOM: 154 mg/dL (ref 70–179)
POTASSIUM: 3.2 mmol/L — ABNORMAL LOW (ref 3.4–4.8)
PROTEIN TOTAL: 6.5 g/dL (ref 5.7–8.2)
SODIUM: 142 mmol/L (ref 135–145)

## 2022-10-17 LAB — MAGNESIUM: MAGNESIUM: 1.9 mg/dL (ref 1.6–2.6)

## 2022-10-17 MED ORDER — DEXAMETHASONE 4 MG TABLET
ORAL_TABLET | Freq: Every day | ORAL | 5 refills | 6 days | Status: CN
Start: 2022-10-17 — End: ?

## 2022-10-17 MED ADMIN — dexAMETHasone (DECADRON) tablet 20 mg: 20 mg | ORAL | @ 17:00:00 | Stop: 2022-10-17

## 2022-10-17 MED ADMIN — ondansetron (ZOFRAN) tablet 24 mg: 24 mg | ORAL | @ 17:00:00 | Stop: 2022-10-17

## 2022-10-17 MED ADMIN — potassium chloride ER tablet 40 mEq: 40 meq | ORAL | @ 18:00:00 | Stop: 2022-10-17

## 2022-10-17 MED ADMIN — leuCOVorin 736 mg in dextrose 5 % 50 mL IVPB: 400 mg/m2 | INTRAVENOUS | @ 18:00:00 | Stop: 2022-10-17

## 2022-10-17 MED ADMIN — dextrose 5 % infusion: 100 mL/h | INTRAVENOUS | @ 17:00:00

## 2022-10-17 MED ADMIN — atropine injection: .4 mg | INTRAVENOUS | @ 18:00:00

## 2022-10-17 MED ADMIN — fluorouracil (ADRUCIL) 4,400 mg in sodium chloride (NS) 0.9 % 46-hr infusion CADD: 2400 mg/m2 | INTRAVENOUS | @ 20:00:00

## 2022-10-17 MED ADMIN — irinotecan (Camptosar) 276 mg in dextrose 5 % 500 mL IVPB: 150 mg/m2 | INTRAVENOUS | @ 18:00:00 | Stop: 2022-10-17

## 2022-10-17 NOTE — Unmapped (Signed)
RED ZONE Means: RED ZONE: Take action now!     You need to be seen right away  Symptoms are at a severe level of discomfort    Call 911 or go to your nearest  Hospital for help     - Bleeding that will not stop    - Hard to breathe    - New seizure - Chest pain  - Fall or passing out  -Thoughts of hurting    yourself or others      Call 911 if you are going into the RED ZONE                  YELLOW ZONE Means:     Please call with any new or worsening symptom(s), even if not on this list.  Call [phone]  After hours, weekends, and holidays - you will reach a long recording with specific instructions, If not in an emergency such as above, please listen closely all the way to the end and choose the option that relates to your need.   You can be seen by a provider the same day through our Same Day Acute Care for Patients with Cancer program.      YELLOW ZONE: Take action today     Symptoms are new or worsening  You are not within your goal range for:    - Pain    - Shortness of breath    - Bleeding (nose, urine, stool, wound)    - Feeling sick to your stomach and throwing up    - Mouth sores/pain in your mouth or throat    - Hard stool or very loose stools (increase in       ostomy output)    - No urine for 12 hours    - Feeding tube or other catheter/tube issue    - Redness or pain at previous IV or port/catheter site    - Depressed or anxiety   - Swelling (leg, arm, abdomen,     face, neck)  - Skin rash or skin changes  - Wound issues (redness, drainage,    re-opened)  - Confusion  - Vision changes  - Fever >100.4 F or chills  - Worsening cough with mucus that is    green, yellow, or bloody  - Pain or burning when going to the    bathroom  - Home Infusion Pump Issue- call    [phone]         Call your healthcare provider if you are going into the YELLOW ZONE     GREEN ZONE Means:  Your symptoms are under controls  Continue to take your medicine as ordered  Keep all visits to the provider GREEN ZONE: You are in control  No increase or worsening symptoms  Able to take your medicine  Able to drink and eat    - DO NOT use MyChart messages to report red or yellow symptoms. Allow up to 3    business days for a reply.  -MyChart is for non-urgent medication refills, scheduling requests, or other general questions.         [id number] Rev. 02/21/2022  Approved by Oncology Patient Education Committee        Hospital Outpatient Visit on 10/17/2022   Component Date Value Ref Range Status    Sodium 10/17/2022 142  135 - 145 mmol/L Final    Potassium 10/17/2022 3.2 (L)  3.4 - 4.8 mmol/L Final    Chloride 10/17/2022 107  98 - 107 mmol/L Final    CO2 10/17/2022 29.0  20.0 - 31.0 mmol/L Final    Anion Gap 10/17/2022 6  5 - 14 mmol/L Final    BUN 10/17/2022 13  9 - 23 mg/dL Final    Creatinine 16/05/9603 0.75  0.55 - 1.02 mg/dL Final    BUN/Creatinine Ratio 10/17/2022 17   Final    eGFR CKD-EPI (2021) Female 10/17/2022 >90  >=60 mL/min/1.33m2 Final    eGFR calculated with CKD-EPI 2021 equation in accordance with [employer] and [employer] of Nephrology Task Force recommendations.    Glucose 10/17/2022 154  70 - 179 mg/dL Final    Calcium 54/04/8118 8.9  8.7 - 10.4 mg/dL Final    Albumin 14/78/2956 3.4  3.4 - 5.0 g/dL Final    Total Protein 10/17/2022 6.5  5.7 - 8.2 g/dL Final    Total Bilirubin 10/17/2022 0.7  0.3 - 1.2 mg/dL Final    AST 21/30/8657 20  <=34 U/L Final    ALT 10/17/2022 33  10 - 49 U/L Final    Alkaline Phosphatase 10/17/2022 127 (H)  46 - 116 U/L Final    Magnesium 10/17/2022 1.9  1.6 - 2.6 mg/dL Final    WBC 84/69/6295 5.5  3.6 - 11.2 10*9/L Final    RBC 10/17/2022 3.01 (L)  3.95 - 5.13 10*12/L Final    HGB 10/17/2022 8.4 (L)  11.3 - 14.9 g/dL Final    HCT 28/41/3244 25.7 (L)  34.0 - 44.0 % Final    MCV 10/17/2022 85.5  77.6 - 95.7 fL Final    MCH 10/17/2022 27.9  25.9 - 32.4 pg Final    MCHC 10/17/2022 32.6  32.0 - 36.0 g/dL Final    RDW 08/27/7251 16.7 (H)  12.2 - 15.2 % Final    MPV 10/17/2022 7.3  6.8 - 10.7 fL Final    Platelet 10/17/2022 212  150 - 450 10*9/L Final    Neutrophils % 10/17/2022 61.2  % Final    Lymphocytes % 10/17/2022 26.1  % Final    Monocytes % 10/17/2022 10.3  % Final    Eosinophils % 10/17/2022 1.7  % Final    Basophils % 10/17/2022 0.7  % Final    Absolute Neutrophils 10/17/2022 3.3  1.8 - 7.8 10*9/L Final    Absolute Lymphocytes 10/17/2022 1.4  1.1 - 3.6 10*9/L Final    Absolute Monocytes 10/17/2022 0.6  0.3 - 0.8 10*9/L Final    Absolute Eosinophils 10/17/2022 0.1  0.0 - 0.5 10*9/L Final    Absolute Basophils 10/17/2022 0.0  0.0 - 0.1 10*9/L Final    Anisocytosis 10/17/2022 Slight (A)  Not Present Final

## 2022-10-17 NOTE — Unmapped (Signed)
Hi,    Patient Norma Green called requesting a medication refill for the following:    Medication: Dexamephason  Dosage: 4 mg  Days left of medication: 0  Pharmacy: [facility]      The expected turnaround time is 3-4 business days       Check Indicates criteria has been reviewed and confirmed with the patient:    [x]  Preferred Name   [x]  DOB and/or MR#  [x]  Preferred Contact Method  [x]  Phone Number(s)   [x]  Preferred Pharmacy   []  MyChart     Thank you,  Christell Faith  [facility] Cancer Communication Center  [phone]

## 2022-10-18 NOTE — Unmapped (Signed)
Pt completed chemo infusions without difficulty.  Ambulatory chemo pump connected without problems.  AVS given.  Pt was stable at discharge via wheelchair by husband.

## 2022-10-19 ENCOUNTER — Ambulatory Visit: Admit: 2022-10-19 | Discharge: 2022-10-20 | Payer: MEDICAID

## 2022-10-19 MED ADMIN — heparin, porcine (PF) 100 unit/mL injection 500 Units: 500 [IU] | INTRAVENOUS | @ 17:00:00 | Stop: 2022-10-20

## 2022-10-19 NOTE — Unmapped (Signed)
Patient presented for a home infusion disconnect and line flush today. Patient denied any pain, shortness of breath or nausea during treatment; no s/s of reaction noted. Patient tolerated treatment well and was stable at time of discharge. Patient was escorted to lobby, via wheelchair, by spouse.

## 2022-10-19 NOTE — Unmapped (Signed)
RED ZONE Means: RED ZONE: Take action now!     You need to be seen right away  Symptoms are at a severe level of discomfort    Call 911 or go to your nearest  Hospital for help     - Bleeding that will not stop    - Hard to breathe    - New seizure - Chest pain  - Fall or passing out  -Thoughts of hurting    yourself or others      Call 911 if you are going into the RED ZONE                  YELLOW ZONE Means:     Please call with any new or worsening symptom(s), even if not on this list.  Call [phone]  After hours, weekends, and holidays - you will reach a long recording with specific instructions, If not in an emergency such as above, please listen closely all the way to the end and choose the option that relates to your need.   You can be seen by a provider the same day through our Same Day Acute Care for Patients with Cancer program.      YELLOW ZONE: Take action today     Symptoms are new or worsening  You are not within your goal range for:    - Pain    - Shortness of breath    - Bleeding (nose, urine, stool, wound)    - Feeling sick to your stomach and throwing up    - Mouth sores/pain in your mouth or throat    - Hard stool or very loose stools (increase in       ostomy output)    - No urine for 12 hours    - Feeding tube or other catheter/tube issue    - Redness or pain at previous IV or port/catheter site    - Depressed or anxiety   - Swelling (leg, arm, abdomen,     face, neck)  - Skin rash or skin changes  - Wound issues (redness, drainage,    re-opened)  - Confusion  - Vision changes  - Fever >100.4 F or chills  - Worsening cough with mucus that is    green, yellow, or bloody  - Pain or burning when going to the    bathroom  - Home Infusion Pump Issue- call    [phone]         Call your healthcare provider if you are going into the YELLOW ZONE     GREEN ZONE Means:  Your symptoms are under controls  Continue to take your medicine as ordered  Keep all visits to the provider GREEN ZONE: You are in control  No increase or worsening symptoms  Able to take your medicine  Able to drink and eat    - DO NOT use MyChart messages to report red or yellow symptoms. Allow up to 3    business days for a reply.  -MyChart is for non-urgent medication refills, scheduling requests, or other general questions.         [id number] Rev. 02/21/2022  Approved by Oncology Patient Education Committee

## 2022-10-30 NOTE — Unmapped (Unsigned)
[facility] MED[facility]COL[facility]     CONSULTING PROVIDERS  [place] Biliary Team  ____________________________________________________________________    CANCER HISTORY  Diagnosis: Pancreatic Adenocarcinoma, imaging concerning for metastatic disease to liver  1. 12/12/21: Presented with abdominal pain, weight loss found to have CBD obstruction with both pancreatic head and tail mass along with liver lesion. EUS/ERCP with CBD stent placement.  Biopsy of pancreatic tail mass with adenocarcinoma. Concern for metastatic disease with 1 cm liver lesion.  Also, local extension to left adrenal.   2. 5/4 start FOLFIRINOX (Cy 1 FOLFOX)    Molecular:  Somatic: KRAS, TP53, CCND3, TFEB, VEGFA, TMP 6.3 m/MB  Germline: negative   ____________________________________________________________________    ASSESSMENT  1. 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 Ground glass opacities on CT, asymptomatic and likely r/t to recent virus: remains asymptomatic  3. Cancer associated abdominal pain -controlled  4. Opioid Induced Constipation -controlled  5. Chemo neuropathy, grade 1, ctm  6. Chemotherapy induced neutropenia - managed with GCSF  7. Diarrhea, resolving    RECOMMENDATIONS  1. mFOLIRINOX-->  FOLFIRI + GCSF support.   2. Oxycodone 5mg  every 4 hours prn   3. Continue Miralax and Senna    4. Scheduled dex x 3 days post chemo, zofran, compazine prn  5. RTC 2 weeks for treatment; 4 weeks for CT scan, labs, visit and treatment. [Pump disconnect at [place]]    DISCUSSION  Norma Green    Medical decision making based high complexity of cancer and treatment risk of complications and monitoring toxicity of chemotherapy.    ______________________________________________________________________  HISTORY     Norma Green is seen today at the  [facility] for ongoing management regarding pancreatic cancer.      Norma Green         come sin today doing fairly well.  Right after chemo has some nausea, takes nausea med twice a day for a few days, then improves.  Feels this controls her nausea.  Appetite ok.  Some diarrhea after chemo, if she eats lighter meals, usually diarrhea is not an issue.  Can take imodium which helps.  Neuropathy is better in hands, doesn't reel much of anything in hands.  Feet still feel numbness/tingling, but feels she is used to it, feels not quite as bad.  Occasional abd pain, back pains, uses oxycodone prn with good control.  She has noticed some mild bone pains post GCSF.      MEDICAL HISTORY  1. HTN  2. Anxiety    Allergies   Allergen Reactions    Venom-Honey Bee Swelling    Metronidazole Nausea And Vomiting     Medications reviewed in the EMR    SOCIAL HISTORY  Lives with family including husband. Smoker. No alcohol or drug use.  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 New grandbaby on the way 03/2022    PHYSICAL EXAM  There were no vitals filed for this visit.  GENERAL: well developed, well nourished, in no distress  PSYCH: full and appropriate range of affect with good insight and judgement  HEENT: NCAT, pupils equal, sclerae anicteric  PULM: normal resp rate  EXT: warm and well perfused, no edema  SKIN: No rashes    OBJECTIVE DATA  LABS  Labs reviewed in emr, ok for treatment  CBC, CMP reviewed  Slow rise  in tumor marker, Last Ca 19-9 70.22    RADIOLOGY RESULTS   None today

## 2022-10-31 ENCOUNTER — Ambulatory Visit: Admit: 2022-10-31 | Discharge: 2022-11-01 | Payer: MEDICAID

## 2022-10-31 ENCOUNTER — Other Ambulatory Visit: Admit: 2022-10-31 | Discharge: 2022-11-01 | Payer: MEDICAID

## 2022-10-31 DIAGNOSIS — C259 Malignant neoplasm of pancreas, unspecified: Principal | ICD-10-CM

## 2022-10-31 DIAGNOSIS — C787 Secondary malignant neoplasm of liver and intrahepatic bile duct: Principal | ICD-10-CM

## 2022-10-31 DIAGNOSIS — Z09 Encounter for follow-up examination after completed treatment for conditions other than malignant neoplasm: Principal | ICD-10-CM

## 2022-10-31 LAB — CBC W/ AUTO DIFF
BASOPHILS ABSOLUTE COUNT: 0 10*9/L (ref 0.0–0.1)
BASOPHILS RELATIVE PERCENT: 0.3 %
EOSINOPHILS ABSOLUTE COUNT: 0.1 10*9/L (ref 0.0–0.5)
EOSINOPHILS RELATIVE PERCENT: 0.5 %
HEMATOCRIT: 30.9 % — ABNORMAL LOW (ref 34.0–44.0)
HEMOGLOBIN: 10.1 g/dL — ABNORMAL LOW (ref 11.3–14.9)
LYMPHOCYTES ABSOLUTE COUNT: 1.9 10*9/L (ref 1.1–3.6)
LYMPHOCYTES RELATIVE PERCENT: 15 %
MEAN CORPUSCULAR HEMOGLOBIN CONC: 32.6 g/dL (ref 32.0–36.0)
MEAN CORPUSCULAR HEMOGLOBIN: 28.1 pg (ref 25.9–32.4)
MEAN CORPUSCULAR VOLUME: 86.3 fL (ref 77.6–95.7)
MEAN PLATELET VOLUME: 8.2 fL (ref 6.8–10.7)
MONOCYTES ABSOLUTE COUNT: 0.8 10*9/L (ref 0.3–0.8)
MONOCYTES RELATIVE PERCENT: 6.6 %
NEUTROPHILS ABSOLUTE COUNT: 9.8 10*9/L — ABNORMAL HIGH (ref 1.8–7.8)
NEUTROPHILS RELATIVE PERCENT: 77.6 %
PLATELET COUNT: 146 10*9/L — ABNORMAL LOW (ref 150–450)
RED BLOOD CELL COUNT: 3.58 10*12/L — ABNORMAL LOW (ref 3.95–5.13)
RED CELL DISTRIBUTION WIDTH: 18.4 % — ABNORMAL HIGH (ref 12.2–15.2)
WBC ADJUSTED: 12.6 10*9/L — ABNORMAL HIGH (ref 3.6–11.2)

## 2022-10-31 LAB — COMPREHENSIVE METABOLIC PANEL
ALBUMIN: 3.5 g/dL (ref 3.4–5.0)
ALKALINE PHOSPHATASE: 152 U/L — ABNORMAL HIGH (ref 46–116)
ALT (SGPT): 36 U/L (ref 10–49)
ANION GAP: 7 mmol/L (ref 5–14)
AST (SGOT): 27 U/L (ref <=34)
BILIRUBIN TOTAL: 0.5 mg/dL (ref 0.3–1.2)
BLOOD UREA NITROGEN: 15 mg/dL (ref 9–23)
BUN / CREAT RATIO: 17
CALCIUM: 8.9 mg/dL (ref 8.7–10.4)
CHLORIDE: 105 mmol/L (ref 98–107)
CO2: 26 mmol/L (ref 20.0–31.0)
CREATININE: 0.9 mg/dL
EGFR CKD-EPI (2021) FEMALE: 74 mL/min/{1.73_m2} (ref >=60)
GLUCOSE RANDOM: 93 mg/dL (ref 70–179)
POTASSIUM: 3.8 mmol/L (ref 3.5–5.1)
PROTEIN TOTAL: 6.4 g/dL (ref 5.7–8.2)
SODIUM: 138 mmol/L (ref 135–145)

## 2022-10-31 LAB — MAGNESIUM: MAGNESIUM: 1.8 mg/dL (ref 1.6–2.6)

## 2022-10-31 LAB — CANCER ANTIGEN 19-9: CA 19-9: 1179.52 U/mL — ABNORMAL HIGH (ref 0–35)

## 2022-10-31 MED ORDER — APIXABAN 2.5 MG TABLET
ORAL_TABLET | Freq: Two times a day (BID) | ORAL | 6 refills | 30 days | Status: CP
Start: 2022-10-31 — End: ?

## 2022-10-31 MED ORDER — OXYCODONE 5 MG TABLET
ORAL_TABLET | Freq: Four times a day (QID) | ORAL | 0 refills | 23 days | Status: CP | PRN
Start: 2022-10-31 — End: 2022-11-30

## 2022-10-31 MED ADMIN — dexAMETHasone (DECADRON) tablet 20 mg: 20 mg | ORAL | @ 18:00:00 | Stop: 2022-10-31

## 2022-10-31 MED ADMIN — leuCOVorin 736 mg in dextrose 5 % 50 mL IVPB: 400 mg/m2 | INTRAVENOUS | @ 19:00:00 | Stop: 2022-10-31

## 2022-10-31 MED ADMIN — fluorouracil (ADRUCIL) 4,400 mg in sodium chloride (NS) 0.9 % 46-hr infusion CADD: 2400 mg/m2 | INTRAVENOUS | @ 21:00:00

## 2022-10-31 MED ADMIN — atropine injection: .4 mg | INTRAVENOUS | @ 19:00:00

## 2022-10-31 MED ADMIN — irinotecan (Camptosar) 276 mg in dextrose 5 % 500 mL IVPB: 150 mg/m2 | INTRAVENOUS | @ 19:00:00 | Stop: 2022-10-31

## 2022-10-31 MED ADMIN — ondansetron (ZOFRAN) tablet 24 mg: 24 mg | ORAL | @ 18:00:00 | Stop: 2022-10-31

## 2022-10-31 MED ADMIN — dextrose 5 % infusion: 100 mL/h | INTRAVENOUS | @ 18:00:00

## 2022-10-31 MED ADMIN — iohexol (OMNIPAQUE) 350 mg iodine/mL solution 100 mL: 100 mL | INTRAVENOUS | @ 15:00:00 | Stop: 2022-10-31

## 2022-10-31 NOTE — Unmapped (Signed)
OUTPATIENT ONCOLOGY PALLIATIVE CARE CONSULT NOTE    Norma Green is a 62 y.o. female who is seen in consultation at the request of Dr. Suzzanne Cloud for evaluation of Patient and Family Support     Principal Diagnosis: Norma Green is a 62 y.o. female with pancreatic adenocarcinoma, diagnosed in 11/2021. Disease sites include liver.     Assessment/Plan:     #Cancer-Related Pain:    #Dyspnea:    #Fatigue:    #Nausea/Vomiting    #Constipation:    #Mood/Coping:    #Goals of Care/Prognostic Awareness:    #Advance Care Planning:  HCDM:   HCDM (patient stated preference): Green,Norma - Son - [phone]  Natural surrogate decision maker: ***  Advance Directive: ***      # Controlled substances risk management.   - Patient {Blank single:19197::has a signed pain medication agreement with Outpatient Palliative Care, completed on ***, as per standard of care,does not have a signed pain medication agreement with our team}.   - NCCSRS database was reviewed today and it {Blank single:19197::was,was not} appropriate.   - Urine drug screen {Blank single:19197::was,was not} performed at this visit. Findings: {Blank single:19197::not applicable,appropriate,inappropriate: ***,periodic screening has been appropriate,***}.   - Patient has received information about safe storage and administration of medications.   - Patient {Blank single:19197::has,has not} received a prescription for narcan.     Current cancer-directed therapy: FOLFIRI + GCSF support.     F/u: ***    ----------------------------------------  Referring Provider: Suzzanne Cloud  Oncology Team: Eula Fried DNP, Dr. Forbes Cellar and Ted Mcalpine RN  PCP: [facility], [place]      HPI: 62 y.o. 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 Recent hospitalization on 2/10 to 2/14 for hematemesis and melena and found to have MW tear and occluded CBD stent. Went to OR for EGD ans was found to have two MW tears at [employer] junction, s/p 2 clips. Also with E. Coli bacteremia iso stent occlusion and stent associated cholangitis. ERCP was performed on 2/12 with successful removal of stent obstruction.        Symptom Review:  Pain: ***  Fatigue: ***   Mobility: ***  Sleep: ***  Appetite: ***  Nausea: ***  Bowel function: ***  Dyspnea: ***  Mood: ***    Palliative Performance Scale: {Palliative Performance ZOXWR:60454098}      Social History:     Occupation:   Hobbies:    Objective     Opioid Risk Tool:    Female  Female    Family history of substance abuse      Alcohol  1  3    Illegal drugs  2  3    Rx drugs  4  4    Personal history of substance abuse      Alcohol  3  3    Illegal drugs  4  4    Rx drugs  5  5    Age between 16--45 years  1  1    History of preadolescent sexual abuse  3  0    Psychological disease      ADD, OCD, bipolar, schizophrenia  2  2    Depression  1  1       Total: ***  (<3 low risk, 4-7 moderate risk, >8 high risk)    Hematology/Oncology History   Pancreatic adenocarcinoma (CMS-HCC)   12/13/2021 Initial Diagnosis    Pancreatic adenocarcinoma (CMS-HCC)  12/23/2021 -  Cancer Staged    Staging form: Exocrine Pancreas, AJCC 8th Edition  - Clinical: Stage IV (cM1) - Signed by Roni Bread, MD on 12/23/2021       12/26/2021 -  Chemotherapy    OP GI mFOLFIRINOX  oxaliplatin 85 mg/m2 IV on day 1, irinotecan 150 mg/m2 IV on day 1, leucovorin 400 mg/m2 IV on day 1, fluorouracil 2,400 mg/m2 CIVI over 46 hours on day 1, every 14 days         Past Medical History:   Diagnosis Date    Allergic rhinitis 12/30/2007    Hypertension     Murmur, heart     Sleep related leg cramps 11/03/2011    Tobacco use disorder 11/03/2011       Allergies:   Allergies   Allergen Reactions    Venom-Honey Bee Swelling    Metronidazole Nausea And Vomiting       Family History:  Cancer-related family history is negative for Breast cancer, Cancer, Colon cancer, and Ovarian cancer.  She indicated that her mother is deceased. She indicated that her father is deceased. She indicated that the status of her sister is unknown. She indicated that the status of her maternal grandmother is unknown. She indicated that the status of her maternal grandfather is unknown. She indicated that the status of her paternal grandmother is unknown. She indicated that the status of her paternal grandfather is unknown. She indicated that the status of her daughter is unknown. She indicated that the status of her neg hx is unknown. She indicated that the status of her other is unknown.      REVIEW OF SYSTEMS:  A comprehensive review of 10 systems was negative except for pertinent positives noted in HPI.    PHYSICAL EXAM:   Vital signs for this encounter: VS reviewed in EPIC.  GEN: Awake and alert, pleasant appearing female in no acute distress  HEENT: No scleral icterus. No facial asymmetry. Mucous membranes moist.   CV: Regular rhythm with normal rate, no murmurs appreciated  LUNGS: CTAB, No increased work of breathing.  ABD: Soft, NT, ND, normactive bowel sounds  SKIN: No rashes, petechiae or jaundice noted on examined skin  MSK: *** sarcopenia,   EXT: No edema noted of the lower extremities  NEURO: No focal deficits appreciated. No myoclonus.  PSYCH: Alert and oriented to person, place and time. Euthymic.           I personally spent *** minutes face-to-face and non-face-to-face in the care of this patient, which includes all pre, intra, and post visit time on the date of service.  All documented time was specific to the E/M visit and does not include any procedures that may have been performed.    Pam Drown, FNP-BC, [facility]  [facility] Outpatient Oncology Palliative Care   [facility]  [address] [place], [place] [address] procedures that may have been performed.    Pam Drown, FNP-BC, [facility]  [facility] Outpatient Oncology Palliative Care   [facility]  [address] [place], [place] [address]

## 2022-11-01 NOTE — Unmapped (Signed)
Patient seen today for C22D1 Folfiri infusion. She tolerated all well with no s/s of adverse reactions. 5FU pump connected for home infusion with no complications. She left via wheelchair with family in NAD.

## 2022-11-01 NOTE — Unmapped (Signed)
Lab on 10/31/2022   Component Date Value Ref Range Status    Magnesium 10/31/2022 1.8  1.6 - 2.6 mg/dL Final    Sodium 16/05/9603 138  135 - 145 mmol/L Final    Potassium 10/31/2022 3.8  3.5 - 5.1 mmol/L Final    Chloride 10/31/2022 105  98 - 107 mmol/L Final    CO2 10/31/2022 26.0  20.0 - 31.0 mmol/L Final    Anion Gap 10/31/2022 7  5 - 14 mmol/L Final    BUN 10/31/2022 15  9 - 23 mg/dL Final    Creatinine 54/04/8118 0.90  0.55 - 1.02 mg/dL Final    BUN/Creatinine Ratio 10/31/2022 17   Final    eGFR CKD-EPI (2021) Female 10/31/2022 74  >=60 mL/min/1.42m2 Final    eGFR calculated with CKD-EPI 2021 equation in accordance with [employer] and [employer] of Nephrology Task Force recommendations.    Glucose 10/31/2022 93  70 - 179 mg/dL Final    Calcium 14/78/2956 8.9  8.7 - 10.4 mg/dL Final    Albumin 21/30/8657 3.5  3.4 - 5.0 g/dL Final    Total Protein 10/31/2022 6.4  5.7 - 8.2 g/dL Final    Total Bilirubin 10/31/2022 0.5  0.3 - 1.2 mg/dL Final    AST 84/69/6295 27  <=34 U/L Final    ALT 10/31/2022 36  10 - 49 U/L Final    Alkaline Phosphatase 10/31/2022 152 (H)  46 - 116 U/L Final    CA 19-9 10/31/2022 1,179.52 (H)  0 - 35 U/mL Final    WBC 10/31/2022 12.6 (H)  3.6 - 11.2 10*9/L Final    RBC 10/31/2022 3.58 (L)  3.95 - 5.13 10*12/L Final    HGB 10/31/2022 10.1 (L)  11.3 - 14.9 g/dL Final    HCT 28/41/3244 30.9 (L)  34.0 - 44.0 % Final    MCV 10/31/2022 86.3  77.6 - 95.7 fL Final    MCH 10/31/2022 28.1  25.9 - 32.4 pg Final    MCHC 10/31/2022 32.6  32.0 - 36.0 g/dL Final    RDW 08/27/7251 18.4 (H)  12.2 - 15.2 % Final    MPV 10/31/2022 8.2  6.8 - 10.7 fL Final    Platelet 10/31/2022 146 (L)  150 - 450 10*9/L Final    Neutrophils % 10/31/2022 77.6  % Final    Lymphocytes % 10/31/2022 15.0  % Final    Monocytes % 10/31/2022 6.6  % Final    Eosinophils % 10/31/2022 0.5  % Final    Basophils % 10/31/2022 0.3  % Final    Absolute Neutrophils 10/31/2022 9.8 (H)  1.8 - 7.8 10*9/L Final    Absolute Lymphocytes 10/31/2022 1.9  1.1 - 3.6 10*9/L Final    Absolute Monocytes 10/31/2022 0.8  0.3 - 0.8 10*9/L Final    Absolute Eosinophils 10/31/2022 0.1  0.0 - 0.5 10*9/L Final    Absolute Basophils 10/31/2022 0.0  0.0 - 0.1 10*9/L Final    Anisocytosis 10/31/2022 Slight (A)  Not Present Final

## 2022-11-02 ENCOUNTER — Ambulatory Visit: Admit: 2022-11-02 | Discharge: 2022-11-03 | Payer: MEDICAID

## 2022-11-02 MED ADMIN — heparin, porcine (PF) 100 unit/mL injection 500 Units: 500 [IU] | INTRAVENOUS | @ 17:00:00 | Stop: 2022-11-03

## 2022-11-02 NOTE — Unmapped (Signed)
Pt to clinic for pump disconnect.  Port flushed and de-accessed.  Pt left via wheelchair with staff.

## 2022-11-05 NOTE — Unmapped (Signed)
The [facility] has made a third and final attempt to reach this patient to refill the following medication:Udenyca.      We have left voicemails on the following phone numbers: [phone], have sent a text message to the following phone numbers: [phone], and have sent a Mychart questionnaire..    Dates contacted: 2/26,3/6,12  Last scheduled delivery: 10/02/22    The patient may be at risk of non-compliance with this medication. The patient should call the [facility] at [phone]  Option 4, then Option 1 (oncology) to refill medication.    Norma Green Norma Green   [facility] Specialty Technician

## 2022-11-05 NOTE — Unmapped (Signed)
Comprehensive Cancer Support Program (CCSP)  Psycho-Oncology Outreach Note    I attempted to reach the patient, but I was unsuccessful. I could not leave a voice message, so I texted the patient and encouraged her to return the writer'call.     [employer], MSW.

## 2022-11-05 NOTE — Unmapped (Signed)
RN Navigator called patient regarding scheduling delivery from pharmacy.  She is going to call [facility] after we get off the phone to schedule medication delivery date.  She was appreciative of call and follow up.  IB sent to update pharmacy team as well as her med onc team.

## 2022-11-10 NOTE — Unmapped (Signed)
Comprehensive Cancer Support Program (CCSP)  Psycho-Oncology Outreach Note    I attempted to reach the patient, but I was unsuccessful. I was unable to leave a voice message due to the voice message system being full.    [employer], MSW.

## 2022-11-11 NOTE — Unmapped (Signed)
Upon investigation, one additional attempt for patient contact was warranted.  The [facility] reach out to patient on 11/11/22 to schedule delivery for [facility].  We were unable to LVM for the patient but sent a text message.       Kermit Balo, [facility]  [facility] Specialty Pharmacist

## 2022-11-11 NOTE — Unmapped (Signed)
Comprehensive Cancer Support Program (CCSP)  Psycho-Oncology Outreach Note    Referral information  Norma Green was referred to the CCSP Psycho-Oncology service for counseling/therapy    Summary: Norma Green is a 62 y.o. diagnosed with malignant neoplasm of pancreas metastatic to liver. Per the referral, the patient is having difficulty adjusting to the life threatening illness. The patient would like support processing the diagnosis.     Referral outcome:  We were unable to reach the patient after 3 separate contact attempts by telephone. We have therefore closed the referral.     The patient was informed of how to reschedule their appointment and that it is their responsibility to verify with their insurance provider if they have questions or concerns about service coverage.    Thank you for allowing me to participate in the care of this patient.    [employer], MSW.

## 2022-11-12 NOTE — Unmapped (Signed)
[facility] Refill Coordination Note    Specialty Medication(s) to be Shipped:   Hematology/Oncology: [name]    Other medication(s) to be shipped: No additional medications requested for fill at this time     Norma Green, DOB: 07-10-1961  Phone: [phone] (home) [phone] (work)      All above HIPAA information was verified with patient.     Was a Nurse, learning disability used for this call? No    Completed refill call assessment today to schedule patient's medication shipment from the [facility] [phone]).  All relevant notes have been reviewed.     Specialty medication(s) and dose(s) confirmed: Regimen is correct and unchanged.   Changes to medications: [name] reports no changes at this time.  Changes to insurance: No  New side effects reported not previously addressed with a pharmacist or physician: None reported  Questions for the pharmacist: No    Confirmed patient received a Conservation officer, historic buildings and a Surveyor, mining with first shipment. The patient will receive a drug information handout for each medication shipped and additional FDA Medication Guides as required.       DISEASE/MEDICATION-SPECIFIC INFORMATION        For patients on injectable medications: Patient currently has 0 doses left.  Next injection is scheduled for 11/17/22.    SPECIALTY MEDICATION ADHERENCE     Medication Adherence    Patient reported X missed doses in the last month: 0  Specialty Medication: UDENYCA 6 mg/0.6 mL injection ([id number])  Patient is on additional specialty medications: No  Patient is on more than two specialty medications: No     Were doses missed due to medication being on hold? No    Udenyca 6mg /0.19mL: 0 days of medicine on hand       REFERRAL TO PHARMACIST     Referral to the pharmacist: Not needed      [facility]     Shipping address confirmed in Epic.     Delivery Scheduled: Yes, Expected medication delivery date: 11/13/22.     Medication will be delivered via Same Day Courier to the prescription address in Epic WAM.    Ernestine Mcmurray   [facility] Specialty Technician

## 2022-11-13 MED FILL — UDENYCA 6 MG/0.6 ML SUBCUTANEOUS SYRINGE: SUBCUTANEOUS | 28 days supply | Qty: 1.2 | Fill #2

## 2022-11-14 DIAGNOSIS — C259 Malignant neoplasm of pancreas, unspecified: Principal | ICD-10-CM

## 2022-11-14 NOTE — Unmapped (Signed)
Called Norma Green about her missed appointment today. Ask if she was going to be able to come? She said not today. She is not feeling well. She said, She talked to her doctor and her doctor said they would reschedule her appointment.

## 2022-11-14 NOTE — Unmapped (Signed)
RN Navigator contacted pt to follow up after Triage call. Pt reports diarrhea starting yesterday afternoon. Had approximately 9 watery stools. Starting taking immodium and episodes have slowed down. Last BM 1 hour ago.    Drinking fluids. Will start incorporating Gatorade with water.     Denies dizziness, nausea, no longer has HA, pt states BP is good this morning.     Pt out of town last week. Felt constipated. Had prune juice and immediately started having loose stools.     RN Navigator offered infusion visit for fluids. Pt refused- felt it was not needed.    Discussed pt with Dr. Forbes Cellar, will plan to push infusions/ appts out 1 week.

## 2022-11-14 NOTE — Unmapped (Addendum)
Returned call to Norma Green after receiving an after hours triage call from [facility].      Pt called this AM around 622 am regarding diarrhea.  Per the RN note she has had severe diarrhea that started last night. It is watery with out blood and she has pain on her side and back. The pain is about 6/10 and she has been taking in sips of water. She told the RN that she was a little bit lightheaded and fatigued.  She took Lomotil around 6 am and has no fever.  She has an infusion visit this am and she is unsure if she was going to make it.        I called and they had not left yet for the appt because she was concerned about not being able to control the diarrhea.  She is no longer lightheaded She also took the imodium about 7am.  Her diarrhea has slowed down a bit. She needs to cancel todays appt and would like to reschedule.      Triage Recommendations:   I will notify the team that she is not able to make it today and to :     Take Imodium 2 tablets with onset of diarrhea and then 1 tablet every 2 hours as needed until 8 hours without diarrhea.   Lomotil take every 6 hours (the atropine helps with cramping)      Drink plenty of fluids at least 64 oz - 80 oz of water a day.     When someone has diarrhea it is best to limit high fiber foods like bran, popcorn, whole grains, dried beans, peas, raw vegetables (especially broccoli, cabbage, brussels sprouts), berries or things commonly considered roughage until the diarrhea ends. ?This allows some gut rest. ?When diarrhea has stopped, slowly add foods with fiber back in.       Eat foods high in pectin, such as applesauce and bananas, to firm bowel movements. ?You may consider trying the BRAT diet which is Bananas, Rice (white), Applesauce, Toast (white).       Foods better tolerated are baked chicken, [place], white pasta, pancakes, oatmeal, cornflakes, cooked carrots, peeled and cooked zucchini/squash, and yogurt.       Try Rice Porridge which is 1 cup long grain white rice combined with 6 cups chicken broth- cooked 40 minutes over medium heat until rice is soupy/sticky to help restore fluids, electrolytes, and soothe irritated bowels.      Pt Response:   She was appreciative of the call and will try these options.

## 2022-11-14 NOTE — Unmapped (Signed)
Upcoming Appt:  Future Appointments   Date Time Provider Department Center   11/14/2022  7:30 AM ONCINF CHAIR 21 [facility]   11/16/2022  1:00 PM ONCINF CHAIR 02 [facility]   11/28/2022  9:30 AM ADULT ONC LAB [facility]   11/28/2022 10:30 AM Triglianos, Tammy Ann, ANP ONCMULTI [facility]   11/28/2022 11:00 AM ONCINF CHAIR 25 [facility]   11/28/2022  2:00 PM Burnis Kingfisher, FNP ONCMULTI [facility]       Recent:   What is the date of your last related visit?  10/31/22  Related acute medications Rx'd:  Lomotil  Home treatment tried:  Lomotil 20 mins ago      Relevant:   Allergies: Venom-honey bee and Metronidazole  Medications: See Epic   Health History: HTN, Pancreatic Adenocarcinoma  Weight: NA      [place] Cancer patients only:  What was the date of your last cancer treatment (mm/dd/yy)?: 10/31/22  Was the treatment oral or infusion?: Chemo  Are you currently on TVEC (yes/no)?: NA  Reason for Disposition   [1] SEVERE diarrhea (e.g., 7 or more times / day more than normal) AND [2] receiving chemotherapy or radiation therapy (within past 4 weeks)    Answer Assessment - Initial Assessment Questions  1. DIARRHEA SEVERITY: How bad is the diarrhea? How many extra stools have you had in the past 24 hours than normal?     - MILD (Scale 1-3; CTCAE Grade 1): Few loose or mushy BMs; increase of 1-3 stools over normal daily number of stools; mild increase in ostomy output.    - MODERATE (Scale 4-7; CTCAE Grade 2): Increase of 4-6 stools daily over normal; moderate increase in ostomy output.    - SEVERE (Scale 8-10; or worst possible; CTCAE Grade 3): Increase of 7 or more stools daily over normal; moderate increase in ostomy output; incontinence.      Moderate - Severe 7 x or more per patient, Caller concern she can not make her chemo appt this am  2. ONSET: When did the diarrhea begin?       ! pm yesterday 11/13/22 17 hrs ago  3. BM CONSISTENCY: How loose or watery is the diarrhea? Is there any blood in the stool?      water  4. BM ODOR: Does the stool smell worse or different than usual?      Unsure  5. VOMITING: Are you also vomiting? If Yes, ask: How many times in the past 24 hours?       Denies  6. ABDOMEN PAIN: Are you having any abdomen pain? If Yes, ask: What does it feel like? (e.g., crampy, dull, intermittent, constant)       On my side and back  7. ABDOMEN PAIN SEVERITY: If present, ask: How bad is the pain?  (e.g., Scale 1-10; mild, moderate, or severe)     - MILD (1-3): doesn't interfere with normal activities, abdomen soft and not tender to touch      - MODERATE (4-7): interferes with normal activities or awakens from sleep, abdomen tender to touch      - SEVERE (8-10): excruciating pain, doubled over, unable to do any normal activities        6/10 only  8. ORAL INTAKE: If vomiting, Have you been able to drink liquids? How much fluids have you had in the past 24 hours?      Just little sips of water.  9. HYDRATION: Any  signs of dehydration? (e.g., dry mouth [not just dry lips], too weak to stand, dizziness, new weight loss) When did you last urinate?      A little bit, a little lightheaded. Fatigue  10. EXPOSURE: Have you traveled to a foreign country recently? Have you been exposed to anyone with diarrhea? Could you have eaten any food that was spoiled?        Unsure, Unsure  11. CANCER: What type of cancer do you have?        Pancreatic  12. CANCER - TREATMENT: What cancer treatments have you received? When did you last take or receive them? (e.g., chemotherapy, immunotherapy, radiation, or recent surgery). If triager has access to the patient's medical record, triager should review treatments and administration dates.        Chemo  13. CANCER - NEUTROPENIA RISK: Have you received chemotherapy recently? If Yes, ask: When was it and what was your last WBC and ANC (absolute neutrophil count)? Were you told that your white cell count was low? If triager has access to the patient's medical record, triager should review most recent labs. An ANC less than 1,000 - 1,500 means that the neutrophils are low and the immune system is weak.        Yes, 10/31/22, ANC 9.8  14. C DIFF RSK: Have you ever had c-difficile (C-Diff) diarrhea?         Unsure  15. DIARRHEA MEDICINES: Are you taking any medicines right now to make the diarrhea better? If Yes, ask: What drugs are you taking? (e.g., Immodium, Lomotil)        Lomotil 20 mins ago  16. OTHER SYMPTOMS: Do you have any other symptoms? (e.g., fever, blood in stool)        Denies  fever, blood in stool    Protocols used: Cancer - Diarrhea-A-AH

## 2022-11-17 NOTE — Unmapped (Signed)
RN Navigator called to check in on pt. Pt reports that diarrhea has improved. Had 1 soft BM yesterday. Took 3 tablets imodium yesterday. Encouraged pt to hold imodium now that diarrhea has slowed down.     Pt states she is drinking plenty of fluids and tolerating po.    Encouraged pt to call if symptoms worsen or do not improve.

## 2022-11-21 ENCOUNTER — Ambulatory Visit: Admit: 2022-11-21 | Discharge: 2022-11-22 | Payer: MEDICAID

## 2022-11-21 LAB — COMPREHENSIVE METABOLIC PANEL
ALBUMIN: 3.5 g/dL (ref 3.4–5.0)
ALKALINE PHOSPHATASE: 125 U/L — ABNORMAL HIGH (ref 46–116)
ALT (SGPT): 25 U/L (ref 10–49)
ANION GAP: 6 mmol/L (ref 5–14)
AST (SGOT): 28 U/L (ref <=34)
BILIRUBIN TOTAL: 0.4 mg/dL (ref 0.3–1.2)
BLOOD UREA NITROGEN: 17 mg/dL (ref 9–23)
BUN / CREAT RATIO: 21
CALCIUM: 9.5 mg/dL (ref 8.7–10.4)
CHLORIDE: 107 mmol/L (ref 98–107)
CO2: 28 mmol/L (ref 20.0–31.0)
CREATININE: 0.8 mg/dL
EGFR CKD-EPI (2021) FEMALE: 85 mL/min/{1.73_m2} (ref >=60)
GLUCOSE RANDOM: 106 mg/dL (ref 70–179)
POTASSIUM: 3.8 mmol/L (ref 3.5–5.1)
PROTEIN TOTAL: 6.8 g/dL (ref 5.7–8.2)
SODIUM: 141 mmol/L (ref 135–145)

## 2022-11-21 LAB — CBC W/ AUTO DIFF
BASOPHILS ABSOLUTE COUNT: 0.1 10*9/L (ref 0.0–0.1)
BASOPHILS RELATIVE PERCENT: 0.6 %
EOSINOPHILS ABSOLUTE COUNT: 0.1 10*9/L (ref 0.0–0.5)
EOSINOPHILS RELATIVE PERCENT: 1.3 %
HEMATOCRIT: 32.3 % — ABNORMAL LOW (ref 34.0–44.0)
HEMOGLOBIN: 10.7 g/dL — ABNORMAL LOW (ref 11.3–14.9)
LYMPHOCYTES ABSOLUTE COUNT: 2.3 10*9/L (ref 1.1–3.6)
LYMPHOCYTES RELATIVE PERCENT: 27.7 %
MEAN CORPUSCULAR HEMOGLOBIN CONC: 33.2 g/dL (ref 32.0–36.0)
MEAN CORPUSCULAR HEMOGLOBIN: 27.8 pg (ref 25.9–32.4)
MEAN CORPUSCULAR VOLUME: 83.9 fL (ref 77.6–95.7)
MEAN PLATELET VOLUME: 7.4 fL (ref 6.8–10.7)
MONOCYTES ABSOLUTE COUNT: 0.7 10*9/L (ref 0.3–0.8)
MONOCYTES RELATIVE PERCENT: 8.7 %
NEUTROPHILS ABSOLUTE COUNT: 5.1 10*9/L (ref 1.8–7.8)
NEUTROPHILS RELATIVE PERCENT: 61.7 %
PLATELET COUNT: 249 10*9/L (ref 150–450)
RED BLOOD CELL COUNT: 3.84 10*12/L — ABNORMAL LOW (ref 3.95–5.13)
RED CELL DISTRIBUTION WIDTH: 17.8 % — ABNORMAL HIGH (ref 12.2–15.2)
WBC ADJUSTED: 8.3 10*9/L (ref 3.6–11.2)

## 2022-11-21 LAB — CANCER ANTIGEN 19-9: CA 19-9: 2075.46 U/mL — ABNORMAL HIGH (ref 0–35)

## 2022-11-21 LAB — MAGNESIUM: MAGNESIUM: 1.9 mg/dL (ref 1.6–2.6)

## 2022-11-21 MED ADMIN — leuCOVorin 736 mg in dextrose 5 % 50 mL IVPB: 400 mg/m2 | INTRAVENOUS | @ 18:00:00 | Stop: 2022-11-21

## 2022-11-21 MED ADMIN — atropine injection: .4 mg | INTRAVENOUS | @ 17:00:00

## 2022-11-21 MED ADMIN — ondansetron (ZOFRAN) tablet 24 mg: 24 mg | ORAL | @ 17:00:00 | Stop: 2022-11-21

## 2022-11-21 MED ADMIN — irinotecan (Camptosar) 276 mg in dextrose 5 % 500 mL IVPB: 150 mg/m2 | INTRAVENOUS | @ 18:00:00 | Stop: 2022-11-21

## 2022-11-21 MED ADMIN — prochlorperazine (COMPAZINE) tablet 10 mg: 10 mg | ORAL | @ 19:00:00

## 2022-11-21 MED ADMIN — dexAMETHasone (DECADRON) tablet 20 mg: 20 mg | ORAL | @ 17:00:00 | Stop: 2022-11-21

## 2022-11-21 MED ADMIN — dextrose 5 % infusion: 100 mL/h | INTRAVENOUS | @ 17:00:00

## 2022-11-21 MED ADMIN — fluorouracil (ADRUCIL) 4,400 mg in sodium chloride (NS) 0.9 % 46-hr infusion CADD: 2400 mg/m2 | INTRAVENOUS | @ 20:00:00

## 2022-11-21 NOTE — Unmapped (Signed)
RED ZONE Means: RED ZONE: Take action now!     You need to be seen right away  Symptoms are at a severe level of discomfort    Call 911 or go to your nearest  Hospital for help     - Bleeding that will not stop    - Hard to breathe    - New seizure - Chest pain  - Fall or passing out  -Thoughts of hurting    yourself or others      Call 911 if you are going into the RED ZONE                  YELLOW ZONE Means:     Please call with any new or worsening symptom(s), even if not on this list.  Call [phone]  After hours, weekends, and holidays - you will reach a long recording with specific instructions, If not in an emergency such as above, please listen closely all the way to the end and choose the option that relates to your need.   You can be seen by a provider the same day through our Same Day Acute Care for Patients with Cancer program.      YELLOW ZONE: Take action today     Symptoms are new or worsening  You are not within your goal range for:    - Pain    - Shortness of breath    - Bleeding (nose, urine, stool, wound)    - Feeling sick to your stomach and throwing up    - Mouth sores/pain in your mouth or throat    - Hard stool or very loose stools (increase in       ostomy output)    - No urine for 12 hours    - Feeding tube or other catheter/tube issue    - Redness or pain at previous IV or port/catheter site    - Depressed or anxiety   - Swelling (leg, arm, abdomen,     face, neck)  - Skin rash or skin changes  - Wound issues (redness, drainage,    re-opened)  - Confusion  - Vision changes  - Fever >100.4 F or chills  - Worsening cough with mucus that is    green, yellow, or bloody  - Pain or burning when going to the    bathroom  - Home Infusion Pump Issue- call    [phone]         Call your healthcare provider if you are going into the YELLOW ZONE     GREEN ZONE Means:  Your symptoms are under controls  Continue to take your medicine as ordered  Keep all visits to the provider GREEN ZONE: You are in control  No increase or worsening symptoms  Able to take your medicine  Able to drink and eat    - DO NOT use MyChart messages to report red or yellow symptoms. Allow up to 3    business days for a reply.  -MyChart is for non-urgent medication refills, scheduling requests, or other general questions.         [id number] Rev. 02/21/2022  Approved by Oncology Patient Education Committee        Hospital Outpatient Visit on 11/21/2022   Component Date Value Ref Range Status    Magnesium 11/21/2022 1.9  1.6 - 2.6 mg/dL Final    Sodium 02/72/5366 141  135 - 145 mmol/L Final    Potassium 11/21/2022 3.8  3.5 - 5.1 mmol/L Final    Chloride 11/21/2022 107  98 - 107 mmol/L Final    CO2 11/21/2022 28.0  20.0 - 31.0 mmol/L Final    Anion Gap 11/21/2022 6  5 - 14 mmol/L Final    BUN 11/21/2022 17  9 - 23 mg/dL Final    Creatinine 16/05/9603 0.80  0.55 - 1.02 mg/dL Final    BUN/Creatinine Ratio 11/21/2022 21   Final    eGFR CKD-EPI (2021) Female 11/21/2022 85  >=60 mL/min/1.60m2 Final    eGFR calculated with CKD-EPI 2021 equation in accordance with [employer] and [employer] of Nephrology Task Force recommendations.    Glucose 11/21/2022 106  70 - 179 mg/dL Final    Calcium 54/04/8118 9.5  8.7 - 10.4 mg/dL Final    Albumin 14/78/2956 3.5  3.4 - 5.0 g/dL Final    Total Protein 11/21/2022 6.8  5.7 - 8.2 g/dL Final    Total Bilirubin 11/21/2022 0.4  0.3 - 1.2 mg/dL Final    AST 21/30/8657 28  <=34 U/L Final    ALT 11/21/2022 25  10 - 49 U/L Final    Alkaline Phosphatase 11/21/2022 125 (H)  46 - 116 U/L Final    CA 19-9 11/21/2022 2,075.46 (H)  0 - 35 U/mL Final    WBC 11/21/2022 8.3  3.6 - 11.2 10*9/L Final    RBC 11/21/2022 3.84 (L)  3.95 - 5.13 10*12/L Final    HGB 11/21/2022 10.7 (L)  11.3 - 14.9 g/dL Final    HCT 84/69/6295 32.3 (L)  34.0 - 44.0 % Final    MCV 11/21/2022 83.9  77.6 - 95.7 fL Final    MCH 11/21/2022 27.8  25.9 - 32.4 pg Final    MCHC 11/21/2022 33.2  32.0 - 36.0 g/dL Final    RDW 28/41/3244 17.8 (H)  12.2 - 15.2 % Final    MPV 11/21/2022 7.4  6.8 - 10.7 fL Final    Platelet 11/21/2022 249  150 - 450 10*9/L Final    Neutrophils % 11/21/2022 61.7  % Final    Lymphocytes % 11/21/2022 27.7  % Final    Monocytes % 11/21/2022 8.7  % Final    Eosinophils % 11/21/2022 1.3  % Final    Basophils % 11/21/2022 0.6  % Final    Absolute Neutrophils 11/21/2022 5.1  1.8 - 7.8 10*9/L Final    Absolute Lymphocytes 11/21/2022 2.3  1.1 - 3.6 10*9/L Final    Absolute Monocytes 11/21/2022 0.7  0.3 - 0.8 10*9/L Final    Absolute Eosinophils 11/21/2022 0.1  0.0 - 0.5 10*9/L Final    Absolute Basophils 11/21/2022 0.1  0.0 - 0.1 10*9/L Final    Anisocytosis 11/21/2022 Slight (A)  Not Present Final

## 2022-11-21 NOTE — Unmapped (Signed)
Pt arrived to unit, port accessed, labs drawn and sent. All labs within tx parameters. Pt tolerating all tx well. Will send home on [place] pump. No further needs stated.

## 2022-12-05 ENCOUNTER — Ambulatory Visit: Admit: 2022-12-05 | Discharge: 2022-12-06

## 2022-12-05 ENCOUNTER — Other Ambulatory Visit: Admit: 2022-12-05 | Discharge: 2022-12-06

## 2022-12-05 DIAGNOSIS — F419 Anxiety disorder, unspecified: Principal | ICD-10-CM

## 2022-12-05 DIAGNOSIS — C259 Malignant neoplasm of pancreas, unspecified: Principal | ICD-10-CM

## 2022-12-05 DIAGNOSIS — K5900 Constipation, unspecified: Principal | ICD-10-CM

## 2022-12-05 DIAGNOSIS — C787 Secondary malignant neoplasm of liver and intrahepatic bile duct: Principal | ICD-10-CM

## 2022-12-05 DIAGNOSIS — Z09 Encounter for follow-up examination after completed treatment for conditions other than malignant neoplasm: Principal | ICD-10-CM

## 2022-12-05 DIAGNOSIS — T451X5A Adverse effect of antineoplastic and immunosuppressive drugs, initial encounter: Principal | ICD-10-CM

## 2022-12-05 DIAGNOSIS — G620 Drug-induced polyneuropathy: Principal | ICD-10-CM

## 2022-12-05 LAB — COMPREHENSIVE METABOLIC PANEL
ALBUMIN: 3.2 g/dL — ABNORMAL LOW (ref 3.4–5.0)
ALKALINE PHOSPHATASE: 156 U/L — ABNORMAL HIGH (ref 46–116)
ALT (SGPT): 26 U/L (ref 10–49)
ANION GAP: 7 mmol/L (ref 5–14)
AST (SGOT): 19 U/L (ref <=34)
BILIRUBIN TOTAL: 0.3 mg/dL (ref 0.3–1.2)
BLOOD UREA NITROGEN: 16 mg/dL (ref 9–23)
BUN / CREAT RATIO: 14
CALCIUM: 8.7 mg/dL (ref 8.7–10.4)
CHLORIDE: 107 mmol/L (ref 98–107)
CO2: 28 mmol/L (ref 20.0–31.0)
CREATININE: 1.13 mg/dL — ABNORMAL HIGH
EGFR CKD-EPI (2021) FEMALE: 56 mL/min/{1.73_m2} — ABNORMAL LOW (ref >=60)
GLUCOSE RANDOM: 149 mg/dL (ref 70–179)
POTASSIUM: 3.4 mmol/L — ABNORMAL LOW (ref 3.5–5.1)
PROTEIN TOTAL: 6.2 g/dL (ref 5.7–8.2)
SODIUM: 142 mmol/L (ref 135–145)

## 2022-12-05 LAB — CBC W/ AUTO DIFF
BASOPHILS ABSOLUTE COUNT: 0 10*9/L (ref 0.0–0.1)
BASOPHILS RELATIVE PERCENT: 0.2 %
EOSINOPHILS ABSOLUTE COUNT: 0.1 10*9/L (ref 0.0–0.5)
EOSINOPHILS RELATIVE PERCENT: 0.9 %
HEMATOCRIT: 34 % (ref 34.0–44.0)
HEMOGLOBIN: 11.3 g/dL (ref 11.3–14.9)
LYMPHOCYTES ABSOLUTE COUNT: 2 10*9/L (ref 1.1–3.6)
LYMPHOCYTES RELATIVE PERCENT: 16.6 %
MEAN CORPUSCULAR HEMOGLOBIN CONC: 33.2 g/dL (ref 32.0–36.0)
MEAN CORPUSCULAR HEMOGLOBIN: 28 pg (ref 25.9–32.4)
MEAN CORPUSCULAR VOLUME: 84.2 fL (ref 77.6–95.7)
MEAN PLATELET VOLUME: 7.8 fL (ref 6.8–10.7)
MONOCYTES ABSOLUTE COUNT: 0.7 10*9/L (ref 0.3–0.8)
MONOCYTES RELATIVE PERCENT: 6.2 %
NEUTROPHILS ABSOLUTE COUNT: 9.2 10*9/L — ABNORMAL HIGH (ref 1.8–7.8)
NEUTROPHILS RELATIVE PERCENT: 76.1 %
PLATELET COUNT: 167 10*9/L (ref 150–450)
RED BLOOD CELL COUNT: 4.03 10*12/L (ref 3.95–5.13)
RED CELL DISTRIBUTION WIDTH: 17.6 % — ABNORMAL HIGH (ref 12.2–15.2)
WBC ADJUSTED: 12.1 10*9/L — ABNORMAL HIGH (ref 3.6–11.2)

## 2022-12-05 LAB — MAGNESIUM: MAGNESIUM: 1.8 mg/dL (ref 1.6–2.6)

## 2022-12-05 LAB — CANCER ANTIGEN 19-9: CA 19-9: 2878.77 U/mL — ABNORMAL HIGH (ref 0–35)

## 2022-12-05 MED ORDER — MIRTAZAPINE 7.5 MG TABLET
ORAL_TABLET | Freq: Every evening | ORAL | 0 refills | 30 days | Status: CP
Start: 2022-12-05 — End: ?

## 2022-12-05 MED ADMIN — fluorouracil (ADRUCIL) 4,400 mg in sodium chloride (NS) 0.9 % 46-hr infusion CADD: 2400 mg/m2 | INTRAVENOUS | @ 19:00:00

## 2022-12-05 MED ADMIN — leuCOVorin 736 mg in dextrose 5 % 50 mL IVPB: 400 mg/m2 | INTRAVENOUS | @ 17:00:00 | Stop: 2022-12-05

## 2022-12-05 MED ADMIN — dexAMETHasone (DECADRON) tablet 20 mg: 20 mg | ORAL | @ 17:00:00 | Stop: 2022-12-05

## 2022-12-05 MED ADMIN — ondansetron (ZOFRAN) tablet 24 mg: 24 mg | ORAL | @ 17:00:00 | Stop: 2022-12-05

## 2022-12-05 MED ADMIN — irinotecan (Camptosar) 276 mg in dextrose 5 % 500 mL IVPB: 150 mg/m2 | INTRAVENOUS | @ 17:00:00 | Stop: 2022-12-05

## 2022-12-05 MED ADMIN — prochlorperazine (COMPAZINE) tablet 10 mg: 10 mg | ORAL | @ 18:00:00

## 2022-12-05 MED ADMIN — atropine injection: .4 mg | INTRAVENOUS | @ 18:00:00

## 2022-12-05 MED ADMIN — dextrose 5 % infusion: 100 mL/h | INTRAVENOUS | @ 17:00:00

## 2022-12-05 NOTE — Unmapped (Signed)
OUTPATIENT ONCOLOGY PALLIATIVE CARE NOTE         Principal Diagnosis: Norma Green is a 62 y.o. female with pancreatic adenocarcinoma, diagnosed in 11/2021. Disease sites include liver.     Assessment/Plan:     #Cancer-Related Pain: Abd-controlled well with current regimen  -Continue oxycodone 5 mg every 4 hours as needed-averaging 2 doses/week  -Our team can help write for opioids.  Pain contract not signed, but will get on next visit    #Anorexia-concerned about slow decline in weight loss.  Does not want to lose anymore weight.  -Consult for dietitian  -Start mirtazapine 7.5 mg at at bedtime      #Altered bowel regimen  -Reviewed both MiraLAX and Imodium    #Support  -Provided epic support number to get the MyChart reestablished.    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 Would like to have her own [employer].     #Advance Care Planning:  HCDM:   HCDM (patient stated preference): Norma Green - Son - [phone]        # Controlled substances risk management.   - Patient does not have a signed pain medication agreement with our team.   - [place]CCSRS database was reviewed today and it was appropriate.   - Urine drug screen was not performed at this visit. Findings: not applicable.   - Patient has received information about safe storage and administration of medications.       Current cancer-directed therapy: FOLFIRI + GCSF support.     F/u: 4 wks during infusion    ----------------------------------------    Oncology Team: Eula Fried DNP, Dr. Forbes Cellar and Ted Mcalpine RN  PCP: [facility], [place]      HPI: 62 y.o. 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 Recent hospitalization on 2/10 to 2/14 for hematemesis and melena and found to have MW tear and occluded CBD stent. Went to OR for EGD ans was found to have two MW tears at [employer] junction, s/p 2 clips. Also with E. Coli bacteremia iso stent occlusion and stent associated cholangitis. ERCP was performed on 2/12 with successful removal of stent obstruction.    Scans today-10/31/22 stable.      Symptom Review:  Pain: abd-sore sensation  Fatigue: improving after hospitalization. Family is doing cooking and cleaning.   Mobility: no devices  Sleep: good  Appetite: good  Nausea: intermittent-controlled   Bowel function: daily-uses miralax every other day-has prn imodium after chemo  Dyspnea: denies  Mood: ok-has moments of feeling down      Interval history 12/05/22 CK, Norma Green and Norma Green      Symptom Review:  General: Pretty good  Pain: Pain overall has improved.  Still has moments where she does have abdominal and side pain and using the oxycodone 5 mg usually about 2 times per week on average with good relief.  Fatigue: Energy is okay  Mobility: Uses a wheelchair at [facility], but not at home  Sleep: Describes as okay  Appetite: Decreasing lately  Nausea: Denies  Bowel function: A bit constipated now, but after chemo has experienced diarrhea-took a break from taking the MiraLAX  Mood: Good-has a birthday party tomorrow for one of her grandchildren    Palliative Performance Scale: 70% - Ambulation: Reduced / unable to do normal work, some evidence of disease / Self-Care: Full / Intake: Normal or reduced / Level of Conscious: Full  Social History:   Lives with family-dtr-Norma Green [phone] and her grandchildren. Grandchildren are ages: 65, 2 and 6 months. Highly active in [employer] Volunteers to serve others in [employer]. 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   Hobbies: Her grandchildren    Objective     Opioid Risk Tool:    Female  Female    Family history of substance abuse      Alcohol  1  3    Illegal drugs  2  3    Rx drugs  4  4    Personal history of substance abuse      Alcohol  3  3    Illegal drugs  4  4    Rx drugs  5  5    Age between 16--45 years  1  1    History of preadolescent sexual abuse  3  0    Psychological disease      ADD, OCD, bipolar, schizophrenia  2  2    Depression  1  1       Total: na  (<3 low risk, 4-7 moderate risk, >8 high risk)    Hematology/Oncology History   Pancreatic adenocarcinoma (CMS-HCC)   12/13/2021 Initial Diagnosis    Pancreatic adenocarcinoma (CMS-HCC)     12/23/2021 -  Cancer Staged    Staging form: Exocrine Pancreas, AJCC 8th Edition  - Clinical: Stage IV (cM1) - Signed by Roni Bread, MD on 12/23/2021       12/26/2021 -  Chemotherapy    OP GI mFOLFIRINOX  oxaliplatin 85 mg/m2 IV on day 1, irinotecan 150 mg/m2 IV on day 1, leucovorin 400 mg/m2 IV on day 1, fluorouracil 2,400 mg/m2 CIVI over 46 hours on day 1, every 14 days         Past Medical History:   Diagnosis Date    Allergic rhinitis 12/30/2007    Hypertension     Murmur, heart     Sleep related leg cramps 11/03/2011    Tobacco use disorder 11/03/2011       Allergies:   Allergies   Allergen Reactions    Venom-Honey Bee Swelling    Metronidazole Nausea And Vomiting       Family History:  Cancer-related family history is negative for Breast cancer, Cancer, Colon cancer, and Ovarian cancer.  She indicated that her mother is deceased. She indicated that her father is deceased. She indicated that the status of her sister is unknown. She indicated that the status of her maternal grandmother is unknown. She indicated that the status of her maternal grandfather is unknown. She indicated that the status of her paternal grandmother is unknown. She indicated that the status of her paternal grandfather is unknown. She indicated that the status of her daughter is unknown. She indicated that the status of her neg hx is unknown. She indicated that the status of her other is unknown.      REVIEW OF SYSTEMS:  A comprehensive review of 10 systems was negative except for pertinent positives noted in HPI.    PHYSICAL EXAM:   Vital signs for this encounter: VS reviewed in EPIC.  GEN: Awake and alert, pleasant appearing female in no acute distress  HEENT: No scleral icterus. No facial asymmetry.   LUNGS:No increased work of breathing.  SKIN: No rashes, petechiae or jaundice noted on examined skin  MSK: No sarcopenia,   EXT: No edema noted of the lower extremities  NEURO: No focal deficits appreciated. No myoclonus.  PSYCH: Alert and oriented to person, place and time. Euthymic.           I personally spent 40 minutes face-to-face and non-face-to-face in the care of this patient, which includes all pre, intra, and post visit time on the date of service.  All documented time was specific to the E/M visit and does not include any procedures that may have been performed.    Pam Drown, FNP-BC, [facility]  [facility] Outpatient Oncology Palliative Care   [facility]  [address], [place], [place] [address]

## 2022-12-05 NOTE — Unmapped (Signed)
Pt arrived to unit, labs within tx parameters. Tolerated tx well minus a little nausea. PRN compazine given, no further complaints. Pt hooked up to home pump. No further needs stated.

## 2022-12-05 NOTE — Unmapped (Signed)
[place] [facility]     CONSULTING PROVIDERS  [facility]  ____________________________________________________________________    CANCER HISTORY  Diagnosis: Pancreatic Adenocarcinoma, imaging concerning for metastatic disease to liver  1. 12/12/21: Presented with abdominal pain, weight loss found to have CBD obstruction with both pancreatic head and tail mass along with liver lesion. EUS/ERCP with CBD stent placement.  Biopsy of pancreatic tail mass with adenocarcinoma. Concern for metastatic disease with 1 cm liver lesion.  Also, local extension to left adrenal.   2. 5/4 start FOLFIRINOX (Cy 1 FOLFOX)    Molecular:  Somatic: KRAS, TP53, CCND3, TFEB, VEGFA, TMP 6.3 m/MB  Germline: negative   ____________________________________________________________________    ASSESSMENT  1. 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 Ground glass opacities on CT, asymptomatic and likely r/t to recent virus: remains asymptomatic  3. Cancer associated abdominal pain -controlled  4. Opioid Induced Constipation -controlled  5. Chemo neuropathy, grade 1, ctm  6. Chemotherapy induced neutropenia - managed with GCSF  7. Diarrhea, resolving  8. 0.6 cm thrombus inferior tip of port    RECOMMENDATIONS  1. mFOLIRINOX-->  FOLFIRI + GCSF support.   2. Oxycodone 5mg  every 4 hours prn   3. Continue Miralax and Senna  - restart and take on consistent basis  4. Scheduled dex x 3 days post chemo, zofran, compazine prn  5. Continue Eliquis 2.5mg  BID (lower dose given recent bleed)  6. Trial Claritin prior to and 2-3 days after GCSF   7. RTC 2 weeks for treatment; 4 weeks for CT scan, labs, visit and treatment. [Pump disconnect at [place]]    DISCUSSION  [name] is doing fairly well, past couple of treatments have been uneventful.  Feels claritin around GCSF injection has been helpful with managing side effects.  Continue same treatment plan.    Medical decision making based high complexity of cancer and treatment risk of complications and monitoring toxicity of chemotherapy.    ______________________________________________________________________  HISTORY     Norma Green is seen today at the [facility] for ongoing management regarding pancreatic cancer.      She comes in today doing fairly well.  Struggling with constipation right now.  Not taking miralax consistently.    Eating well.  No n/v.  Nausea after chemo is well managed.    Took claritin prior to and after GCSF and feels this helped with side effects.    Peripheral neuropathy is much better, very mild at this point.  Feels things have been actually going quite well lately.    MEDICAL HISTORY  1. HTN  2. Anxiety    Allergies   Allergen Reactions    Venom-Honey Bee Swelling    Metronidazole Nausea And Vomiting     Medications reviewed in the EMR    SOCIAL HISTORY  Lives with family including husband. Smoker. No alcohol or drug use.  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 New grandbaby on the way 03/2022    PHYSICAL EXAM  Vitals:    12/05/22 1107   BP: 112/59   Pulse: 69   Resp: 18   Temp: 36.8 ??C (98.2 ??F)   SpO2: 100%   GENERAL: well developed, well nourished, in no distress  PSYCH: full and appropriate range of affect with good insight and judgement  HEENT: NCAT, pupils equal, sclerae anicteric  PULM: normal resp rate  EXT: warm and well perfused, no edema  SKIN: No rashes  OBJECTIVE DATA  LABS  Labs reviewed in emr, ok for treatment  CBC, CMP reviewed    RADIOLOGY RESULTS   None today

## 2022-12-05 NOTE — Unmapped (Signed)
Upcoming Appt:  Future Appointments   Date Time Provider Department Center   12/05/2022 10:15 AM ADULT ONC LAB [facility]   12/05/2022 11:30 AM Triglianos, Connye Burkitt, ANP ONCMULTI [facility]   12/05/2022 12:00 PM Burnis Kingfisher, FNP [id number]   12/05/2022  1:00 PM ONCINF CHAIR 28 [id number]   12/07/2022 12:30 PM ONCINF CHAIR 12 [id number]       Recent:   What is the date of your last related visit?  Has appt at 0945 on 12/05/22-  Related acute medications Rx'd:  n/a  Home treatment tried:  n/a      Relevant:   Allergies: Venom-honey bee and Metronidazole  Medications: n/a   Health History: cancer of pancreas and liver  Weight: n/a      [place] [facility] patients only:  What was the date of your last cancer treatment (mm/dd/yy)?: n/a  Was the treatment oral or infusion?: n/a  Are you currently on TVEC (yes/no)?: n/a  Reason for Disposition   Health Information question, no triage required and triager able to answer question    Answer Assessment - Initial Assessment Questions  1. REASON FOR CALL or QUESTION: "What is your reason for calling today?" or "How can I best help you?" or "What question do you have that I can help answer?"      Pt's spouse calling to ask what time to be at [facility] on 12/05/22- for chemo--arrive at 0945.    Protocols used: Information Only Call - No Triage-A-AH

## 2022-12-07 ENCOUNTER — Ambulatory Visit: Admit: 2022-12-07 | Payer: PRIVATE HEALTH INSURANCE

## 2022-12-11 NOTE — Unmapped (Signed)
[facility] Refill Coordination Note    Specialty Medication(s) to be Shipped:   Hematology/Oncology: [name]    Other medication(s) to be shipped: No additional medications requested for fill at this time     Norma Green, DOB: [birth year]  Phone: [phone] (home) [phone] (work)      All above HIPAA information was verified with patient.     Was a Nurse, learning disability used for this call? No    Completed refill call assessment today to schedule patient's medication shipment from the [facility] [phone]).  All relevant notes have been reviewed.     Specialty medication(s) and dose(s) confirmed: Regimen is correct and unchanged.   Changes to medications: Zacaria reports no changes at this time.  Changes to insurance: No  New side effects reported not previously addressed with a pharmacist or physician: None reported  Questions for the pharmacist: No    Confirmed patient received a Conservation officer, historic buildings and a Surveyor, mining with first shipment. The patient will receive a drug information handout for each medication shipped and additional FDA Medication Guides as required.       DISEASE/MEDICATION-SPECIFIC INFORMATION        For patients on injectable medications: Patient currently has 0 doses left.  Next injection is scheduled for 12/22/22.    SPECIALTY MEDICATION ADHERENCE     Medication Adherence    Patient reported X missed doses in the last month: 0  Specialty Medication: Udenyca 6mg /0.19mL  Patient is on additional specialty medications: No  Informant: patient     Were doses missed due to medication being on hold? No    Udenyca 6mg /0.42mL: 0 days of medicine on hand       REFERRAL TO PHARMACIST     Referral to the pharmacist: Not needed      [facility]     Shipping address confirmed in Epic.     Delivery Scheduled: Yes, Expected medication delivery date: 12/18/22.     Medication will be delivered via Same Day Courier to the prescription address in Epic [place].    Wyatt Mage M Elisabeth Cara   [facility] Specialty Technician

## 2022-12-18 MED FILL — UDENYCA 6 MG/0.6 ML SUBCUTANEOUS SYRINGE: SUBCUTANEOUS | 28 days supply | Qty: 1.2 | Fill #3

## 2022-12-19 ENCOUNTER — Ambulatory Visit
Admit: 2022-12-19 | Discharge: 2022-12-20 | Payer: PRIVATE HEALTH INSURANCE | Attending: Registered" | Primary: Registered"

## 2022-12-19 ENCOUNTER — Ambulatory Visit: Admit: 2022-12-19 | Discharge: 2022-12-20 | Payer: PRIVATE HEALTH INSURANCE

## 2022-12-19 LAB — CBC W/ AUTO DIFF
BASOPHILS ABSOLUTE COUNT: 0 10*9/L (ref 0.0–0.1)
BASOPHILS RELATIVE PERCENT: 0.4 %
EOSINOPHILS ABSOLUTE COUNT: 0.1 10*9/L (ref 0.0–0.5)
EOSINOPHILS RELATIVE PERCENT: 1.3 %
HEMATOCRIT: 31.5 % — ABNORMAL LOW (ref 34.0–44.0)
HEMOGLOBIN: 10.5 g/dL — ABNORMAL LOW (ref 11.3–14.9)
LYMPHOCYTES ABSOLUTE COUNT: 1.8 10*9/L (ref 1.1–3.6)
LYMPHOCYTES RELATIVE PERCENT: 20.7 %
MEAN CORPUSCULAR HEMOGLOBIN CONC: 33.2 g/dL (ref 32.0–36.0)
MEAN CORPUSCULAR HEMOGLOBIN: 27.7 pg (ref 25.9–32.4)
MEAN CORPUSCULAR VOLUME: 83.3 fL (ref 77.6–95.7)
MEAN PLATELET VOLUME: 7.2 fL (ref 6.8–10.7)
MONOCYTES ABSOLUTE COUNT: 0.5 10*9/L (ref 0.3–0.8)
MONOCYTES RELATIVE PERCENT: 5.8 %
NEUTROPHILS ABSOLUTE COUNT: 6.4 10*9/L (ref 1.8–7.8)
NEUTROPHILS RELATIVE PERCENT: 71.8 %
PLATELET COUNT: 154 10*9/L (ref 150–450)
RED BLOOD CELL COUNT: 3.79 10*12/L — ABNORMAL LOW (ref 3.95–5.13)
RED CELL DISTRIBUTION WIDTH: 17.2 % — ABNORMAL HIGH (ref 12.2–15.2)
WBC ADJUSTED: 8.9 10*9/L (ref 3.6–11.2)

## 2022-12-19 LAB — COMPREHENSIVE METABOLIC PANEL
ALBUMIN: 3.4 g/dL (ref 3.4–5.0)
ALKALINE PHOSPHATASE: 149 U/L — ABNORMAL HIGH (ref 46–116)
ALT (SGPT): 26 U/L (ref 10–49)
ANION GAP: 7 mmol/L (ref 5–14)
AST (SGOT): 20 U/L (ref <=34)
BILIRUBIN TOTAL: 0.3 mg/dL (ref 0.3–1.2)
BLOOD UREA NITROGEN: 13 mg/dL (ref 9–23)
BUN / CREAT RATIO: 14
CALCIUM: 9.1 mg/dL (ref 8.7–10.4)
CHLORIDE: 106 mmol/L (ref 98–107)
CO2: 30 mmol/L (ref 20.0–31.0)
CREATININE: 0.92 mg/dL
EGFR CKD-EPI (2021) FEMALE: 72 mL/min/{1.73_m2} (ref >=60)
GLUCOSE RANDOM: 152 mg/dL (ref 70–179)
POTASSIUM: 3.6 mmol/L (ref 3.5–5.1)
PROTEIN TOTAL: 6.3 g/dL (ref 5.7–8.2)
SODIUM: 143 mmol/L (ref 135–145)

## 2022-12-19 LAB — MAGNESIUM: MAGNESIUM: 1.8 mg/dL (ref 1.6–2.6)

## 2022-12-19 LAB — CANCER ANTIGEN 19-9: CA 19-9: 4522.66 U/mL — ABNORMAL HIGH (ref 0–35)

## 2022-12-19 MED ADMIN — irinotecan (Camptosar) 276 mg in dextrose 5 % 500 mL IVPB: 150 mg/m2 | INTRAVENOUS | @ 16:00:00 | Stop: 2022-12-19

## 2022-12-19 MED ADMIN — ondansetron (ZOFRAN) tablet 24 mg: 24 mg | ORAL | @ 14:00:00 | Stop: 2022-12-19

## 2022-12-19 MED ADMIN — fluorouracil (ADRUCIL) 4,400 mg in sodium chloride (NS) 0.9 % 46-hr infusion CADD: 2400 mg/m2 | INTRAVENOUS | @ 17:00:00

## 2022-12-19 MED ADMIN — dexAMETHasone (DECADRON) tablet 20 mg: 20 mg | ORAL | @ 14:00:00 | Stop: 2022-12-19

## 2022-12-19 MED ADMIN — leuCOVorin 736 mg in dextrose 5 % 50 mL IVPB: 400 mg/m2 | INTRAVENOUS | @ 15:00:00 | Stop: 2022-12-19

## 2022-12-19 MED ADMIN — dextrose 5 % infusion: 100 mL/h | INTRAVENOUS | @ 15:00:00

## 2022-12-19 MED ADMIN — atropine injection: .4 mg | INTRAVENOUS | @ 15:00:00

## 2022-12-19 NOTE — Unmapped (Signed)
0920: Pt here for scheduled infusion.     1335: Pt tolerated treatment/infusion W/O difficulty. Port left accessed per protocol for home infusion.   Pt left infusion center via wheelchair. NAD, no questions nor complaints voiced at D/C. Pt aware of follow up.

## 2022-12-19 NOTE — Unmapped (Signed)
Docusate sodium (colace) 100mg  by mouth twice a day (may decrease if liquid stool). (May take up to 2 times a day).  Polyethylene glycol (miralax) Drink one capful (17 grams) in 4-8 ounces of warm or cold beverage once a day (may take up to three times a day for constipation)   Senna Take 2 tablets po once daily, preferable at bedtime if no BM during day  Magnesium Citrate ?? bottle for no bowel movement in 3-5 days    GO to emergency department for fever > 100.60F or uncontrolled pain.

## 2022-12-19 NOTE — Unmapped (Signed)
[facility]   Medical Nutrition Therapy Consultation       Visit Type:    Initial Assessment    Referral Reason:   Anorexia      Norma Green is a 62 y.o. female seen for medical nutrition therapy for nutrition during cancer, constipation. Pt with metastatic pancreatic adenocarcinoma currently undergoing treatment with mFOLFIRINOX. Her active problem list, medication list, allergies, family history, social history, notes from last a few encounters, and lab results were reviewed.     Past Medical History:   Diagnosis Date    Allergic rhinitis 12/30/2007    Hypertension     Murmur, heart     Sleep related leg cramps 11/03/2011    Tobacco use disorder 11/03/2011     Anthropometrics   Height: 170.2 cm  Current Weight: 73.7 kg (12/05/2022)  BMI: 25.45 kg/m2  Ideal Body Weight: 61.4 kg ([place])  Percent Ideal Body Weight: 120%  Recent wt change: 1.5 kg wt loss in 3 months  % wt change: 2% wt loss in 3 months    Weight history:  Wt Readings from Last 6 Encounters:   12/05/22 73.7 kg (162 lb 8 oz)   11/21/22 73.8 kg (162 lb 11.2 oz)   10/31/22 73.9 kg (163 lb)   10/08/22 74.7 kg (164 lb 10.9 oz)   10/03/22 75.6 kg (166 lb 9.6 oz)   09/05/22 75.2 kg (165 lb 11.2 oz)       Nutrition Risk Screening:   Food Insecurity: Food Insecurity Present (10/07/2022)    Hunger Vital Sign     Worried About Running Out of Food in the Last Year: Sometimes true     Ran Out of Food in the Last Year: Sometimes true        Nutrition-Focused Physical Findings   Overall Appearance: Nourished  Skin:  Intact    Physical Activity:  Physical activity level is light with some exercise.     Nutrition Focused Physical Exam:    Nutrition Evaluation  Overall Impressions: Nutrition-Focused Physical Exam not indicated due to lack of malnutrition risk factors. (12/23/22 1126)  Nutrition Designation: Overweight (BMI 25.00 - 29.99 kg/m2) (12/23/22 1126)     Malnutrition Assessment using AND/ASPEN or GLIM Clinical Characteristics:    Patient does not meet AND/ASPEN criteria for malnutrition at this time (12/23/22 1130)     Biochemical Data, Medical Tests and Procedures:  All pertinent labs and imaging reviewed by Howie Ill, RD/LDN at 10:50 AM 12/19/2022.    Medications and Vitamin/Mineral Supplementation:   All nutritionally pertinent medications reviewed on 12/19/2022.   Nutritionally pertinent medications include: Miralax  She is taking nutrition supplements: Flinstones chewable X 2 daily    Current Outpatient Medications   Medication Sig Dispense Refill    apixaban (ELIQUIS) 2.5 mg Tab Take 1 tablet (2.5 mg total) by mouth two (2) times a day. 60 tablet 6    dexAMETHasone (DECADRON) 4 MG tablet Take 2 tablets (8 mg total) by mouth daily. Take only on days 2, 3, and 4 of each 14-day chemotherapy cycle. 12 tablet 5    lidocaine-prilocaine (EMLA) 2.5-2.5 % cream Apply 30 minutes prior to port access. 5 g 3    lisinopril (PRINIVIL,ZESTRIL) 40 MG tablet TAKE 1 TABLET BY MOUTH ONCE DAILY FOR HIGH BLOOD PRESSURE      loperamide (IMODIUM) 2 mg capsule If having diarrhea, take 2 capsules (4 mg) after the first loose stool, then 1 capsule every 2 hours until diarrhea  free for 12 hours. 60 capsule 11    mirtazapine (REMERON) 7.5 MG tablet Take 1 tablet (7.5 mg total) by mouth nightly. To help appetite 30 tablet 0    ondansetron (ZOFRAN-ODT) 8 MG disintegrating tablet       pediatric multivitamin no.49 (FLINTSTONES GUMMIES ORAL) Take 1 tablet by mouth daily.      pegfilgrastim-cbqv (UDENYCA) 6 mg/0.6 mL injection Inject the contents of 1 syringe (6 mg) under the skin once on day 4 each chemotherapy cycle (24 hours after the completion of chemotherapy). 1.2 mL 5    polyethylene glycol (GLYCOLAX) 17 gram/dose powder Take 17 g by mouth daily. 255 g 11    prochlorperazine (COMPAZINE) 10 MG tablet Take 1 tablet (10 mg total) by mouth every six (6) hours as needed (nausea). 30 tablet 11     No current facility-administered medications for this visit.       Nutrition History:     Dietary Restrictions: No known food allergies or food intolerances.     Gastrointestinal Issues: Constipation- having BM at least every 2 days. Describes stools as formed, not hard.    Oral Issues: decreased taste last week    Hunger and Satiety: Denied issues.     Food Safety and Access: She noted limited finances and resources.     Other: pain- managing with pain meds      Diet Recall:   Breakfast: sausage and eggs or oatmeal or cereal; sometimes bacon included  Lunch: hamburger or chicken sandwich  Dinner: spaghetti, chicken or steak  Snack(s): fruit, candy, cookies, chips  Beverages: sips on water during day- unable to quantify    Alcohol: none reported  Supplements: none reported  Enteral feedings: no feeding tube    Pt endorses consuming majority of meals; reports eating well most days. Feels fluid intake may be inadequate.  Nutrition Assessment     Altered GI function related to pain medication as evidenced by reported bowel irregularity, need for regular laxative.    Inadequate fluid, fiber intake related to dietary habits as evidenced by diet recall, reported bowel irregularity.    Daily Estimated Nutrient Needs:  Energy:  22-25 kcal/kg using last recorded weight, 73.7 kg (12/23/22 1127)]  Protein: [1.0-1.2 gm/kg using last recorded weight, 73.7 kg (12/23/22 1127)]  Carbohydrate: [no restriction]  Fluid: 2305 mL [Per Xayla Puzio-Segar Method]   Nutrition Goals & Evaluation    - Promote bowel regularity.  (New)  - Adequate hydration.  (New)    Nutrition goals reviewed, and relevant barriers identified and addressed: none evident. She is evaluated to have excellent willingness and ability to achieve nutrition goals.   Nutrition Intervention      - Nutrition Counseling: Goal setting Problem solving  - Nutrition-related Medication Management: OTC medication    Nutrition Plan:   Continue daily Miralax as directed by provider. Pt will review bowel regimen with medical team.  Recommend increasing fluid intake to 8-10 cups daily. Fluids may include water, broth, juice, tea. Suggest warm prune/apple juice to help stimulate gut.  Once increase fluid intake, may gradually incorporate fiber rich foods such as beans, nuts/seeds, fruit/veggies, and whole grains.      Materials Provided: Constipation      Follow up will occur as needed.       Food/Nutrition-related history, Anthropometric measurements, Biochemical data, medical tests, procedures, Nutrition-focused physical findings, Patient understanding or compliance with intervention and recommendations , and Effectiveness of nutrition interventions will be assessed at time of follow-up.  Recommendations for Clinical Team, Care Team , and Referring Provider :  - Encourage pt to follow nutrition plan as noted above.       Time spent 15 minutes

## 2022-12-22 NOTE — Unmapped (Signed)
RN Navigator contacted pt to follow up on constipation. Pt reports that she is feeling much better. Taking miralax bid and having 1 BM daily. Describes as soft. Discussed decreasing miralax to daily if BM become to loose or to frequent.     Pt denies any other questions or concerns. Encouraged her to reach out with any future needs.

## 2023-01-01 NOTE — Unmapped (Signed)
[facility]     CONSULTING PROVIDERS  [facility]  ____________________________________________________________________    CANCER HISTORY  Diagnosis: Pancreatic Adenocarcinoma, imaging concerning for metastatic disease to liver  1. 12/12/21: Presented with abdominal pain, weight loss found to have CBD obstruction with both pancreatic head and tail mass along with liver lesion. EUS/ERCP with CBD stent placement.  Biopsy of pancreatic tail mass with adenocarcinoma. Concern for metastatic disease with 1 cm liver lesion.  Also, local extension to left adrenal.   2. 5/4 start FOLFIRINOX (Cy 1 FOLFOX)    Molecular:  Somatic: KRAS, TP53, CCND3, TFEB, VEGFA, TMP 6.3 m/MB  Germline: negative   ____________________________________________________________________    ASSESSMENT  1. 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 Ground glass opacities on CT, asymptomatic and likely r/t to recent virus: remains asymptomatic  3. Cancer associated abdominal pain -controlled  4. Opioid Induced Constipation -controlled  5. Chemo neuropathy, grade 1, ctm  6. Chemotherapy induced neutropenia - managed with GCSF  7. Diarrhea, resolving  8. 0.6 cm thrombus inferior tip of port    RECOMMENDATIONS  1. mFOLIRINOX-->  FOLFIRI + GCSF support.   2. Oxycodone 5mg  every 4 hours prn   3. Continue Miralax and Senna  - restart and take on consistent basis  4. Scheduled dex x 3 days post chemo, zofran, compazine prn  5. Continue Eliquis 2.5mg  BID (lower dose given recent bleed)  6. Trial Claritin prior to and 2-3 days after GCSF   7. RTC 2 weeks for treatment; 4 weeks for CT scan, labs, visit and treatment. [Pump disconnect at [place]]    DISCUSSION  Norma Green     Medical decision making based high complexity of cancer and treatment risk of complications and monitoring toxicity of chemotherapy.    ______________________________________________________________________  HISTORY     Norma Green standard of care gem + abraxane vs clinical trial pairing this regimen with an IRAK4 inhibitor.  Clinical trial coordinator met with patient to review consent - she would like to think about it and will follow up on Monday with decision.      Medical decision making based high complexity of cancer, interpretation of cancer response, and treatment risk of complications and monitoring toxicity of chemotherapy.    ______________________________________________________________________  HISTORY     Norma Green is seen today at the [facility] for ongoing management regarding pancreatic cancer.     She comes in today doing fair.  Has generally been feeling ok.  Weight down a couple of pounds, but eating ok.  Constipation is better.  Managing abd pain with oxycodone.  Some residual numbness and tingling to fingers and feet, not affecting ADLs.  Overall has been feeling pretty good.      MEDICAL HISTORY  1. HTN  2. Anxiety    Allergies   Allergen Reactions    Venom-Honey Bee Swelling    Metronidazole Nausea And Vomiting     Medications reviewed in the EMR    SOCIAL HISTORY  Lives with family including husband. Smoker. No alcohol or drug use.  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 New grandbaby on the way 03/2022    PHYSICAL EXAM  Vitals:    01/02/23 1007   BP: 117/57   Pulse: 64   Resp: 18   Temp: 36.3 ??C (97.4 ??F)   SpO2: 100%     GENERAL: well developed, well nourished, in no distress  PSYCH:  full and appropriate range of affect with good insight and judgement  HEENT: NCAT, pupils equal, sclerae anicteric  PULM: normal resp rate  EXT: warm and well perfused, no edema  SKIN: No rashes    OBJECTIVE DATA  LABS  Labs reviewed in emr  CBC, CMP reviewed  Rising Ca 19-9 now 4522    RADIOLOGY RESULTS   CT scan impression: (personally reviewed by me and Dr. Forbes Cellar and our impression)  Disease progression with growing liver met and pancreatic mass  Port thrombus no longer visible

## 2023-01-02 ENCOUNTER — Ambulatory Visit: Admit: 2023-01-02 | Discharge: 2023-01-02 | Payer: PRIVATE HEALTH INSURANCE

## 2023-01-02 ENCOUNTER — Other Ambulatory Visit: Admit: 2023-01-02 | Discharge: 2023-01-02 | Payer: PRIVATE HEALTH INSURANCE

## 2023-01-02 DIAGNOSIS — Z515 Encounter for palliative care: Principal | ICD-10-CM

## 2023-01-02 DIAGNOSIS — G893 Neoplasm related pain (acute) (chronic): Principal | ICD-10-CM

## 2023-01-02 DIAGNOSIS — T451X5A Adverse effect of antineoplastic and immunosuppressive drugs, initial encounter: Principal | ICD-10-CM

## 2023-01-02 DIAGNOSIS — C787 Secondary malignant neoplasm of liver and intrahepatic bile duct: Principal | ICD-10-CM

## 2023-01-02 DIAGNOSIS — R630 Anorexia: Principal | ICD-10-CM

## 2023-01-02 DIAGNOSIS — C259 Malignant neoplasm of pancreas, unspecified: Principal | ICD-10-CM

## 2023-01-02 DIAGNOSIS — G620 Drug-induced polyneuropathy: Principal | ICD-10-CM

## 2023-01-02 DIAGNOSIS — Z09 Encounter for follow-up examination after completed treatment for conditions other than malignant neoplasm: Principal | ICD-10-CM

## 2023-01-02 LAB — CANCER ANTIGEN 19-9: CA 19-9: 5131.34 U/mL — ABNORMAL HIGH (ref 0–35)

## 2023-01-02 LAB — COMPREHENSIVE METABOLIC PANEL
ALBUMIN: 3.2 g/dL — ABNORMAL LOW (ref 3.4–5.0)
ALKALINE PHOSPHATASE: 164 U/L — ABNORMAL HIGH (ref 46–116)
ALT (SGPT): 22 U/L (ref 10–49)
ANION GAP: 8 mmol/L (ref 5–14)
AST (SGOT): 19 U/L (ref <=34)
BILIRUBIN TOTAL: 0.3 mg/dL (ref 0.3–1.2)
BLOOD UREA NITROGEN: 16 mg/dL (ref 9–23)
BUN / CREAT RATIO: 18
CALCIUM: 9.1 mg/dL (ref 8.7–10.4)
CHLORIDE: 104 mmol/L (ref 98–107)
CO2: 28 mmol/L (ref 20.0–31.0)
CREATININE: 0.87 mg/dL
EGFR CKD-EPI (2021) FEMALE: 77 mL/min/{1.73_m2} (ref >=60)
GLUCOSE RANDOM: 100 mg/dL (ref 70–179)
POTASSIUM: 4 mmol/L (ref 3.5–5.1)
PROTEIN TOTAL: 6.3 g/dL (ref 5.7–8.2)
SODIUM: 140 mmol/L (ref 135–145)

## 2023-01-02 LAB — CBC W/ AUTO DIFF
BASOPHILS ABSOLUTE COUNT: 0 10*9/L (ref 0.0–0.1)
BASOPHILS RELATIVE PERCENT: 0.4 %
EOSINOPHILS ABSOLUTE COUNT: 0.1 10*9/L (ref 0.0–0.5)
EOSINOPHILS RELATIVE PERCENT: 1 %
HEMATOCRIT: 31.8 % — ABNORMAL LOW (ref 34.0–44.0)
HEMOGLOBIN: 10.5 g/dL — ABNORMAL LOW (ref 11.3–14.9)
LYMPHOCYTES ABSOLUTE COUNT: 1.7 10*9/L (ref 1.1–3.6)
LYMPHOCYTES RELATIVE PERCENT: 18.7 %
MEAN CORPUSCULAR HEMOGLOBIN CONC: 33.1 g/dL (ref 32.0–36.0)
MEAN CORPUSCULAR HEMOGLOBIN: 27.4 pg (ref 25.9–32.4)
MEAN CORPUSCULAR VOLUME: 82.7 fL (ref 77.6–95.7)
MEAN PLATELET VOLUME: 7.5 fL (ref 6.8–10.7)
MONOCYTES ABSOLUTE COUNT: 0.9 10*9/L — ABNORMAL HIGH (ref 0.3–0.8)
MONOCYTES RELATIVE PERCENT: 9.8 %
NEUTROPHILS ABSOLUTE COUNT: 6.5 10*9/L (ref 1.8–7.8)
NEUTROPHILS RELATIVE PERCENT: 70.1 %
PLATELET COUNT: 158 10*9/L (ref 150–450)
RED BLOOD CELL COUNT: 3.85 10*12/L — ABNORMAL LOW (ref 3.95–5.13)
RED CELL DISTRIBUTION WIDTH: 17.5 % — ABNORMAL HIGH (ref 12.2–15.2)
WBC ADJUSTED: 9.3 10*9/L (ref 3.6–11.2)

## 2023-01-02 LAB — MAGNESIUM: MAGNESIUM: 2 mg/dL (ref 1.6–2.6)

## 2023-01-02 MED ORDER — MIRTAZAPINE 7.5 MG TABLET
ORAL_TABLET | Freq: Every evening | ORAL | 6 refills | 30 days | Status: CP
Start: 2023-01-02 — End: ?

## 2023-01-02 MED ORDER — OXYCODONE 5 MG TABLET
ORAL_TABLET | Freq: Four times a day (QID) | ORAL | 0 refills | 23 days | Status: CP | PRN
Start: 2023-01-02 — End: 2023-02-01

## 2023-01-02 MED ADMIN — heparin, porcine (PF) 100 unit/mL injection 500 Units: 500 [IU] | INTRAVENOUS | @ 15:00:00 | Stop: 2023-01-02

## 2023-01-02 MED ADMIN — iohexol (OMNIPAQUE) 350 mg iodine/mL solution 100 mL: 100 mL | INTRAVENOUS | @ 13:00:00 | Stop: 2023-01-02

## 2023-01-02 NOTE — Unmapped (Signed)
OUTPATIENT ONCOLOGY PALLIATIVE CARE NOTE         Principal Diagnosis: Norma Green is a 62 y.o. female with pancreatic adenocarcinoma, diagnosed in 11/2021. Disease sites include liver.     Assessment/Plan:     #Cancer-Related Pain: Abd-controlled well with current regimen  -Continue oxycodone 5 mg every 4 hours as needed-averaging 2 doses/week  -Our team can help write for opioids.  Pain contract not signed, but will get on next visit    #Anorexia-  -[name] seeing  -Continue mirtazapine 7.5 mg at bedtime    #Constipation-controlled with prn miralax  -monitor    #Coping/support-sharing that it was difficult to hear the news of the cancer progression  -Provided emphatic listening.  -Has great family, friend support and strong spiritual practice-shared that this is her power.    #Goals of Care/Prognostic Awareness:  -Considering study. Planning to read this over the w/e.   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 Would like to have her own [place].     #Advance Care Planning:  HCDM:   HCDM (patient stated preference): Green,Norma - Son - [phone]        # Controlled substances risk management.   - Patient does not have a signed pain medication agreement with our team.   - NCCSRS database was reviewed today and it was appropriate.   - Urine drug screen was not performed at this visit. Findings: not applicable.   - Patient has received information about safe storage and administration of medications.       Current cancer-directed therapy: possible study with gem/Abraxane and oral therapy    F/u: early June when at [facility]    ----------------------------------------    Oncology Team: Eula Fried DNP, Dr. Forbes Cellar and Ted Mcalpine RN  PCP: [facility], [place]      HPI: 62 y.o. 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 Recent hospitalization on 2/10 to 2/14 for hematemesis and melena and found to have MW tear and occluded CBD stent. Went to OR for EGD ans was found to have two MW tears at [employer] junction, s/p 2 clips. Also with E. Coli bacteremia iso stent occlusion and stent associated cholangitis. ERCP was performed on 2/12 with successful removal of stent obstruction.    Symptom Review:  Pain: abd-sore sensation  Fatigue: improving after hospitalization. Family is doing cooking and cleaning.   Mobility: no devices  Sleep: good  Appetite: good  Nausea: intermittent-controlled   Bowel function: daily-uses miralax every other day-has prn imodium after chemo  Dyspnea: denies  Mood: ok-has moments of feeling down      Interval history 01/02/23 CK, Norma Green and Norma. Logan Green    -Scans today showed some progression in liver and pancreas  -Abdominal pain controlled with the oxycodone.  -Proactive in using the MiraLAX to prevent constipation.  -Continues to take the mirtazapine to help with appetite      Palliative Performance Scale: 70% - Ambulation: Reduced / unable to do normal work, some evidence of disease / Self-Care: Full / Intake: Normal or reduced / Level of Conscious: Full      Social History:   Lives with family-dtr-Norma Green [phone] and her grandchildren. Grandchildren are ages: 37, 2 and 6 months. Highly active in [employer] Volunteers to serve others in [employer]. 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  Hobbies: Her grandchildren    Objective     Opioid Risk Tool:    Female  Female    Family history of substance abuse      Alcohol  1  3    Illegal drugs  2  3    Rx drugs  4  4    Personal history of substance abuse      Alcohol  3  3    Illegal drugs  4  4    Rx drugs  5  5    Age between 16--45 years  1  1    History of preadolescent sexual abuse  3  0    Psychological disease      ADD, OCD, bipolar, schizophrenia  2  2    Depression  1  1       Total: na  (<3 low risk, 4-7 moderate risk, >8 high risk)    Hematology/Oncology History Pancreatic adenocarcinoma (CMS-HCC)   12/13/2021 Initial Diagnosis    Pancreatic adenocarcinoma (CMS-HCC)     12/23/2021 -  Cancer Staged    Staging form: Exocrine Pancreas, AJCC 8th Edition  - Clinical: Stage IV (cM1) - Signed by Roni Bread, MD on 12/23/2021       12/26/2021 -  Chemotherapy    OP GI mFOLFIRINOX  oxaliplatin 85 mg/m2 IV on day 1, irinotecan 150 mg/m2 IV on day 1, leucovorin 400 mg/m2 IV on day 1, fluorouracil 2,400 mg/m2 CIVI over 46 hours on day 1, every 14 days         Past Medical History:   Diagnosis Date    Allergic rhinitis 12/30/2007    Hypertension     Murmur, heart     Sleep related leg cramps 11/03/2011    Tobacco use disorder 11/03/2011       Allergies:   Allergies   Allergen Reactions    Venom-Honey Bee Swelling    Metronidazole Nausea And Vomiting       Family History:  Cancer-related family history is negative for Breast cancer, Cancer, Colon cancer, and Ovarian cancer.  She indicated that her mother is deceased. She indicated that her father is deceased. She indicated that the status of her sister is unknown. She indicated that the status of her maternal grandmother is unknown. She indicated that the status of her maternal grandfather is unknown. She indicated that the status of her paternal grandmother is unknown. She indicated that the status of her paternal grandfather is unknown. She indicated that the status of her daughter is unknown. She indicated that the status of her neg hx is unknown. She indicated that the status of her other is unknown.      REVIEW OF SYSTEMS:  A comprehensive review of 10 systems was negative except for pertinent positives noted in HPI.    PHYSICAL EXAM:   Vital signs for this encounter: VS reviewed in EPIC.  GEN: Awake and alert, pleasant appearing female in no acute distress  HEENT: No scleral icterus. No facial asymmetry.   LUNGS:No increased work of breathing.  SKIN: No rashes, petechiae or jaundice noted on examined skin  MSK: No sarcopenia,   EXT: No edema noted of the lower extremities  NEURO: No focal deficits appreciated. No myoclonus.  PSYCH: Alert and oriented to person, place and time. Euthymic.           I personally spent 35 minutes face-to-face and non-face-to-face in the care of this patient, which includes all pre, intra,  and post visit time on the date of service.  All documented time was specific to the E/M visit and does not include any procedures that may have been performed.    Pam Drown, FNP-BC, [facility]  [facility]   [facility]  [address] [place], [place] [address]

## 2023-01-02 NOTE — Unmapped (Cosign Needed)
Tammy Triglianos, ANP and I met with the patient and her husband to discuss consent for study NCI-10522. The consent form was reviewed, including discussion of risks and benefits, that the treatment involves research, review of charges covered/not covered by study, medications/treatments used, procedures, confidentiality, time commitments involved, study contact list, the option to withdraw at any time and required use of birth control (if applicable). Alternatives to study participation were discussed.     The patient had ample time to consider participation in the study, in the absence of coercion or undue influence. Patient was offered an opportunity to ask questions and these questions were answered to their satisfaction.   Patient verbalized understanding of information presented.  The patient took home a sample main consent to consider and will reach back out to myself early next week with her decision.

## 2023-01-02 NOTE — Unmapped (Signed)
Addended by: Meyer Russel on: 01/02/2023 11:09 AM     Modules accepted: Orders

## 2023-01-05 NOTE — Unmapped (Signed)
Called [facility], per her request, for questions she had.  Reviewed new chemo regimen of gem/abraxane and side effects.  She would like to proceed with the clinical trial, anxious to get started.

## 2023-01-12 DIAGNOSIS — C259 Malignant neoplasm of pancreas, unspecified: Principal | ICD-10-CM

## 2023-01-12 NOTE — Unmapped (Signed)
[facility] Clinical Assessment & Refill Coordination Note    Norma Green, DOB: July 21, 1961  Phone: [phone] (home) [phone] (work)    All above HIPAA information was verified with patient.     Was a Nurse, learning disability used for this call? No    Specialty Medication(s):   Hematology/Oncology: [name]     Current Outpatient Medications   Medication Sig Dispense Refill    amlodipine (NORVASC) 10 MG tablet Take 1 tablet (10 mg total) by mouth daily.      apixaban (ELIQUIS) 2.5 mg Tab Take 1 tablet (2.5 mg total) by mouth two (2) times a day. 60 tablet 6    dexAMETHasone (DECADRON) 4 MG tablet Take 2 tablets (8 mg total) by mouth daily. Take only on days 2, 3, and 4 of each 14-day chemotherapy cycle. 12 tablet 5    hydroCHLOROthiazide (HYDRODIURIL) 25 MG tablet Take 1 tablet (25 mg total) by mouth. for blood pressure      lidocaine-prilocaine (EMLA) 2.5-2.5 % cream Apply 30 minutes prior to port access. 5 g 3    lisinopril (PRINIVIL,ZESTRIL) 40 MG tablet TAKE 1 TABLET BY MOUTH ONCE DAILY FOR HIGH BLOOD PRESSURE      loperamide (IMODIUM) 2 mg capsule If having diarrhea, take 2 capsules (4 mg) after the first loose stool, then 1 capsule every 2 hours until diarrhea free for 12 hours. 60 capsule 11    metoPROLOL succinate (TOPROL-XL) 100 MG 24 hr tablet Take 1 tablet (100 mg total) by mouth. for high blood pressure      mirtazapine (REMERON) 7.5 MG tablet Take 1 tablet (7.5 mg total) by mouth nightly. To help appetite 30 tablet 6    ondansetron (ZOFRAN-ODT) 8 MG disintegrating tablet       oxyCODONE (ROXICODONE) 5 MG immediate release tablet Take 1 tablet (5 mg total) by mouth every six (6) hours as needed. 90 tablet 0    pediatric multivitamin no.49 (FLINTSTONES GUMMIES ORAL) Take 1 tablet by mouth daily.      pegfilgrastim-cbqv (UDENYCA) 6 mg/0.6 mL injection Inject the contents of 1 syringe (6 mg) under the skin once on day 4 each chemotherapy cycle (24 hours after the completion of chemotherapy). 1.2 mL 5    polyethylene glycol (GLYCOLAX) 17 gram/dose powder Take 17 g by mouth daily. 255 g 11    prochlorperazine (COMPAZINE) 10 MG tablet Take 1 tablet (10 mg total) by mouth every six (6) hours as needed (nausea). 30 tablet 11     No current facility-administered medications for this visit.        Changes to medications: Jason reports no changes at this time.    Allergies   Allergen Reactions    Venom-Honey Bee Swelling    Metronidazole Nausea And Vomiting       Changes to allergies: No    SPECIALTY MEDICATION ADHERENCE     Udenyca 6 mg: 1 syringe on hand     Medication Adherence    Patient reported X missed doses in the last month: 0  Specialty Medication: Udenyca  Patient is on additional specialty medications: No  Informant: patient  Confirmed plan for next specialty medication refill: delivery by pharmacy  Refills needed for supportive medications: not needed          Specialty medication(s) dose(s) confirmed: Regimen is correct and unchanged.     Are there any concerns with adherence? No    Adherence counseling provided? Not needed    CLINICAL MANAGEMENT AND  INTERVENTION      Clinical Benefit Assessment:    Do you feel the medicine is effective or helping your condition? Yes    Clinical Benefit counseling provided? Not needed    Adverse Effects Assessment:    Are you experiencing any side effects? No    Are you experiencing difficulty administering your medicine? No    Quality of Life Assessment:    Quality of Life    Rheumatology  Oncology  1. What impact has your specialty medication had on the reduction of your daily pain or discomfort level?: Some  2. On a scale of 1-10, how would you rate your ability to manage side effects associated with your specialty medication? (1=no issues, 10 = unable to take medication due to side effects): 1  Dermatology  Cystic Fibrosis          How many days over the past month did your Chemo induced neutropenia  keep you from your normal activities? For example, brushing your teeth or getting up in the morning. 0    Have you discussed this with your provider? Not needed    Acute Infection Status:    Acute infections noted within Epic:  No active infections  Patient reported infection: None    Therapy Appropriateness:    Is therapy appropriate and patient progressing towards therapeutic goals? Yes, therapy is appropriate and should be continued    DISEASE/MEDICATION-SPECIFIC INFORMATION      For patients on injectable medications: Patient currently has 1 doses left.  Next injection is scheduled for TBD - sometime this week 24 hours after chemo is completed.    Oncology: Is the patient receiving adequate infection prevention treatment? Yes, gCSF injections  Does the patient have adequate nutritional support? Not applicable    PATIENT SPECIFIC NEEDS     Does the patient have any physical, cognitive, or cultural barriers? No    Is the patient high risk? No    Did the patient require a clinical intervention? No    Does the patient require physician intervention or other additional services (i.e., nutrition, smoking cessation, social work)? No    SOCIAL DETERMINANTS OF HEALTH     At the [facility], we have learned that life circumstances - like trouble affording food, housing, utilities, or transportation can affect the health of many of our patients.   That is why we wanted to ask: are you currently experiencing any life circumstances that are negatively impacting your health and/or quality of life? Patient declined to answer    Social Determinants of Health     Financial Resource Strain: Medium Risk (10/07/2022)    Overall Financial Resource Strain (CARDIA)     Difficulty of Paying Living Expenses: Somewhat hard   Internet Connectivity: No Internet connectivity concern identified (12/17/2021)    Internet Connectivity     Do you have access to internet services: Yes     How do you connect to the internet: Personal Device at home     Is your internet connection strong enough for you to watch video on your device without major problems?: Yes     Do you have enough data to get through the month?: Yes     Does at least one of the devices have a camera that you can use for video chat?: Yes   Food Insecurity: Food Insecurity Present (10/07/2022)    Hunger Vital Sign     Worried About Running Out of Food in the Last Year: Sometimes true  Ran Out of Food in the Last Year: Sometimes true   Tobacco Use: Medium Risk (10/06/2022)    Patient History     Smoking Tobacco Use: Former     Smokeless Tobacco Use: Never     Passive Exposure: Not on file   Housing/Utilities: Low Risk  (05/23/2022)    Housing/Utilities     Within the past 12 months, have you ever stayed: outside, in a car, in a tent, in an overnight shelter, or temporarily in someone else's home (i.e. couch-surfing)?: No     Are you worried about losing your housing?: No     Within the past 12 months, have you been unable to get utilities (heat, electricity) when it was really needed?: No   Alcohol Use: Not on file   Transportation Needs: Unmet Transportation Needs (10/07/2022)    PRAPARE - Transportation     Lack of Transportation (Medical): No     Lack of Transportation (Non-Medical): Yes   Substance Use: Not on file   Health Literacy: Low Risk  (12/17/2021)    Health Literacy     : Never   Physical Activity: Not on file   Interpersonal Safety: Not on file   Stress: Not on file   Intimate Partner Violence: Not At Risk (06/18/2021)    Received from [facility]    Humiliation, Afraid, Rape, and Kick questionnaire     Fear of Current or Ex-Partner: No     Emotionally Abused: No     Physically Abused: No     Sexually Abused: No   Depression: Not on file   Social Connections: Not on file     Would you be willing to receive help with any of the needs that you have identified today? Not applicable       SHIPPING     Specialty Medication(s) to be Shipped:   Hematology/Oncology: [name]    Other medication(s) to be shipped: No additional medications requested for fill at this time     Changes to insurance: No    Delivery Scheduled: Yes, Expected medication delivery date: 01/22/23.     Medication will be delivered via Same Day Courier to the confirmed prescription address in [facility].    The patient will receive a drug information handout for each medication shipped and additional FDA Medication Guides as required.  Verified that patient has previously received a Conservation officer, historic buildings and a Surveyor, mining.    The patient or caregiver noted above participated in the development of this care plan and knows that they can request review of or adjustments to the care plan at any time.      All of the patient's questions and concerns have been addressed.    Kermit Balo, [facility]   [facility] Specialty Pharmacist

## 2023-01-12 NOTE — Unmapped (Signed)
RN Navigator spoke with pt. Told her that we will plan to go ahead and move forward with Gem Abraxane if unable to enroll in trial. Team and pt would like to move forward with treatment. Pt is agreeable to plan. RN Public librarian and infusion scheduling and requested treatment start, 5/24.

## 2023-01-13 NOTE — Unmapped (Signed)
RN Navigator contacted pt to review plan for infusion start on 5/31. Reviewed start time.  Pt was agreeable to plan. Pt states that she is feeling better this week. Denies pain. Not using/needing pain meds. Taking miralax and having regular BMs.     Encouraged pt to reach out with any questions/concerns.

## 2023-01-14 NOTE — Unmapped (Signed)
(  Attempt 1 of 3)   Oncology Nurse Navigator attempted to reach patient to conduct pre-treatment assessment and enrollment in [facility]. Patient unavailable, left voicemail for patient to return call.

## 2023-01-15 NOTE — Unmapped (Signed)
I called Ms. Norma Green to let her know we were given the green light to consent and screen her for the NCI-10522 research study.  She didn't answer but I left a VM asking her to call me back ASAP.

## 2023-01-16 ENCOUNTER — Ambulatory Visit
Admit: 2023-01-16 | Discharge: 2023-01-16 | Payer: PRIVATE HEALTH INSURANCE | Attending: Hematology & Oncology | Primary: Hematology & Oncology

## 2023-01-16 ENCOUNTER — Ambulatory Visit: Admit: 2023-01-16 | Discharge: 2023-01-16 | Payer: PRIVATE HEALTH INSURANCE

## 2023-01-16 DIAGNOSIS — C259 Malignant neoplasm of pancreas, unspecified: Principal | ICD-10-CM

## 2023-01-16 LAB — COMPREHENSIVE METABOLIC PANEL
ALBUMIN: 3.7 g/dL (ref 3.4–5.0)
ALKALINE PHOSPHATASE: 145 U/L — ABNORMAL HIGH (ref 46–116)
ALT (SGPT): 17 U/L (ref 10–49)
ANION GAP: 6 mmol/L (ref 5–14)
AST (SGOT): 23 U/L (ref <=34)
BILIRUBIN TOTAL: 0.4 mg/dL (ref 0.3–1.2)
BLOOD UREA NITROGEN: 12 mg/dL (ref 9–23)
BUN / CREAT RATIO: 17
CALCIUM: 9.6 mg/dL (ref 8.7–10.4)
CHLORIDE: 107 mmol/L (ref 98–107)
CO2: 28 mmol/L (ref 20.0–31.0)
CREATININE: 0.69 mg/dL
EGFR CKD-EPI (2021) FEMALE: >90 mL/min/{1.73_m2} (ref >=60)
GLUCOSE RANDOM: 106 mg/dL (ref 70–179)
POTASSIUM: 3.3 mmol/L — ABNORMAL LOW (ref 3.4–4.8)
PROTEIN TOTAL: 7.5 g/dL (ref 5.7–8.2)
SODIUM: 141 mmol/L (ref 135–145)

## 2023-01-16 LAB — CK: CREATINE KINASE TOTAL: 109 U/L

## 2023-01-16 LAB — BILIRUBIN, TOTAL: BILIRUBIN TOTAL: 0.4 mg/dL (ref 0.3–1.2)

## 2023-01-16 LAB — ALT: ALT (SGPT): 17 U/L (ref 10–49)

## 2023-01-16 LAB — CBC W/ AUTO DIFF
BASOPHILS ABSOLUTE COUNT: 0.1 10*9/L (ref 0.0–0.1)
BASOPHILS RELATIVE PERCENT: 1.1 %
EOSINOPHILS ABSOLUTE COUNT: 0.1 10*9/L (ref 0.0–0.5)
EOSINOPHILS RELATIVE PERCENT: 2.2 %
HEMATOCRIT: 35.8 % (ref 34.0–44.0)
HEMOGLOBIN: 11.8 g/dL (ref 11.3–14.9)
LYMPHOCYTES ABSOLUTE COUNT: 1.9 10*9/L (ref 1.1–3.6)
LYMPHOCYTES RELATIVE PERCENT: 30.2 %
MEAN CORPUSCULAR HEMOGLOBIN CONC: 32.9 g/dL (ref 32.0–36.0)
MEAN CORPUSCULAR HEMOGLOBIN: 26.8 pg (ref 25.9–32.4)
MEAN CORPUSCULAR VOLUME: 81.3 fL (ref 77.6–95.7)
MEAN PLATELET VOLUME: 7.3 fL (ref 6.8–10.7)
MONOCYTES ABSOLUTE COUNT: 0.8 10*9/L (ref 0.3–0.8)
MONOCYTES RELATIVE PERCENT: 12.4 %
NEUTROPHILS ABSOLUTE COUNT: 3.5 10*9/L (ref 1.8–7.8)
NEUTROPHILS RELATIVE PERCENT: 54.1 %
PLATELET COUNT: 193 10*9/L (ref 150–450)
RED BLOOD CELL COUNT: 4.4 10*12/L (ref 3.95–5.13)
RED CELL DISTRIBUTION WIDTH: 16.5 % — ABNORMAL HIGH (ref 12.2–15.2)
WBC ADJUSTED: 6.4 10*9/L (ref 3.6–11.2)

## 2023-01-16 LAB — GLUCOSE, RANDOM: GLUCOSE RANDOM: 106 mg/dL (ref 70–179)

## 2023-01-16 LAB — AST: AST (SGOT): 23 U/L (ref <=34)

## 2023-01-16 LAB — POTASSIUM: POTASSIUM: 3.3 mmol/L — ABNORMAL LOW (ref 3.4–4.8)

## 2023-01-16 LAB — PROTEIN, TOTAL: PROTEIN TOTAL: 7.5 g/dL (ref 5.7–8.2)

## 2023-01-16 LAB — CREATININE
CREATININE: 0.69 mg/dL
EGFR CKD-EPI (2021) FEMALE: >90 mL/min/{1.73_m2} (ref >=60)

## 2023-01-16 LAB — ALKALINE PHOSPHATASE: ALKALINE PHOSPHATASE: 145 U/L — ABNORMAL HIGH (ref 46–116)

## 2023-01-16 LAB — BUN: BLOOD UREA NITROGEN: 12 mg/dL (ref 9–23)

## 2023-01-16 LAB — CALCIUM: CALCIUM: 9.6 mg/dL (ref 8.7–10.4)

## 2023-01-16 LAB — ALBUMIN: ALBUMIN: 3.7 g/dL (ref 3.4–5.0)

## 2023-01-16 LAB — CHLORIDE: CHLORIDE: 107 mmol/L (ref 98–107)

## 2023-01-16 LAB — SODIUM: SODIUM: 141 mmol/L (ref 135–145)

## 2023-01-16 LAB — LACTATE DEHYDROGENASE: LACTATE DEHYDROGENASE: 190 U/L (ref 120–246)

## 2023-01-16 LAB — PHOSPHORUS: PHOSPHORUS: 3.2 mg/dL (ref 2.4–5.1)

## 2023-01-16 NOTE — Unmapped (Signed)
(  Attempt 2 of 3)   Oncology Nurse Navigator attempted to reach patient to conduct pre-treatment assessment and enrollment in [facility]. Patient unavailable, left voicemail for patient to return call.

## 2023-01-16 NOTE — Unmapped (Signed)
I spoke with Norma Green about coming to [facility] tomorrow to see Dr. Forbes Cellar, Jiles Harold and myself to consent for the NCI-10522 study.  The patient will be there.  She knows not to have labs drawn prior.  She will have labs and ECG collected after signing study consent.

## 2023-01-16 NOTE — Unmapped (Signed)
[facility]     CONSULTING PROVIDERS  [facility]  ____________________________________________________________________    CANCER HISTORY  Diagnosis: Pancreatic Adenocarcinoma, imaging concerning for metastatic disease to liver  1. 12/12/21: Presented with abdominal pain, weight loss found to have CBD obstruction with both pancreatic head and tail mass along with liver lesion. EUS/ERCP with CBD stent placement.  Biopsy of pancreatic tail mass with adenocarcinoma. Concern for metastatic disease with 1 cm liver lesion.  Also, local extension to left adrenal.   2. 12/26/21-12/2022 FOLFIRINOX (Cy 1 FOLFOX). Best response PR.   - 04/11/22 final oxaliplatin d/t neuropathy  - 12/2022 intrahepatic progresion  3. 01/23/23 [id number]: gemcitabine + abraxane + CA-4989 (IRAK4 inhibitor) planned start    Molecular:  Somatic: KRAS, TP53, CCND3, TFEB, VEGFA, TMP 6.3 m/MB  Germline: negative   ____________________________________________________________________    ASSESSMENT  1. 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  Cancer associated abdominal pain -controlled  3. Opioid Induced Constipation -controlled  5. Chemo neuropathy, grade 1, ctm  6.  0.6 cm thrombus inferior tip of port - resolved    RECOMMENDATIONS  1. Start NCI 54098: gemcitabine + abraxane + CA-4989 (an IRAK4 inhibitor) --> consented today. Dose level 2 [250mg  BID and gem/abraxane d1,8 of a 21 day cycle]  2. Oxycodone 5mg  every 4 hours prn; ok to take 10 mg if pain is not adequately controlled  3. Continue Miralax and Senna  - consistent basis  4. Continue Eliquis 2.5mg  BID    5. RTC for treatment start, scheduled 01/23/23    DISCUSSION  Reviewed the rationale for changing to gem/abraxane, uncertainty about benefit with change but hope for cancer control. Discussed rationale for adding the study drug, hope for better duration of cancer control but uncertainty that this investigational drug will do anything to help her cancer outcome. She understands that there is not evidence for benefit and is willing and interested to pursue this investigational drug. She has had a chance to review the consent today and wants to move forward.     Medical decision making based high complexity of cancer, interpretation of cancer response, and treatment risk of complications and monitoring toxicity of chemotherapy.    ______________________________________________________________________  HISTORY     Norma Green is seen today at the [facility] for ongoing management regarding pancreatic cancer.      Pain is generally controlled. She is taking oxy every 4-6hrs and most of the time the gnawing pain is improved with pain meds but sometimes feels like it is not enough  Energy is ok, able to do quite well  Appetite is ok.     MEDICAL HISTORY  1. HTN  2. Anxiety    Allergies   Allergen Reactions    Venom-Honey Bee Swelling    Metronidazole Nausea And Vomiting     Medications reviewed in the EMR    SOCIAL HISTORY  Lives with family including husband. Smoker. No alcohol or drug use.  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   EXT: no edema  SKIN: No rashes    OBJECTIVE DATA  LABS  Ordered for after visit today    RADIOLOGY RESULTS   None today  Most recent scan with progressing intrahepatic disease

## 2023-01-16 NOTE — Unmapped (Signed)
Dr. Forbes Cellar, Cristal Generous and I met with the patient, her husband and her daughter to discuss consent for study [id number]. The consent form was reviewed, including discussion of risks and benefits, that the treatment involves research, review of charges covered/not covered by study, medications/treatments used, procedures, confidentiality, time commitments involved, study contact list, the option to withdraw at any time and required use of birth control (if applicable). Alternatives to study participation were discussed.     The patient had ample time to consider participation in the study, in the absence of coercion or undue influence. Patient was offered an opportunity to ask questions and these questions were answered to their satisfaction.   Patient verbalized understanding of information presented. The patient has signed and dated the following documents in my presence: HIPAA Authorization Form (version date 10/01/2018) and Informed Consent (version date 08/14/22)    Signed and dated copies of all consent documents were given to the patient. Every effort to maintain confidentiality will be employed. No procedures specifically related to the research were performed prior to informed consent being signed.          3.1 Eligibility Criteria    3.1.1 Patients must have histologically or cytologically confirmed adenocarcinoma of the pancreas that is metastatic or unresectable and for which standard curative or palliative measures do not exist or are no longer effective.   Pancreatic Adenocarcinoma, metastatic disease to liver, MD note 5/24    3.1.2 Patients must have had disease progression on or after 5-FU-based therapy for metastatic or unresectable PDAC. If received gemcitabine-based regimen as adjuvant therapy, then gemcitabine and nab-paclitaxel (if used) should be >12 months from study enrollment. Prior use of gemcitabine/nab-paclitaxel for metastatic or unresectable disease is not allowed.   12/26/21-12/2022 FOLFIRINOX, oxaliplatin d/t, 01/02/23 intrahepatic progression    3.1.3 Age ?18 years. Because no dosing or adverse event data are currently available on the use of CA-4948 in combination with gemcitabine and nab-paclitaxel in patients <42 years of age, children are excluded from this study.   62 yo female, dob 07/02/1961    3.1.4 ECOG performance status ?2 (Karnofsky ?60%, see Appendix A).   ECOG = 1 MD note 5/24    3.1.5 Patients must have adequate organ and marrow function as defined below:  ? absolute neutrophil count ?1,500/mcL  ? platelets ?100,000/mcL  ? total bilirubin ?1.5 ?? institutional upper limit of normal (ULN)  ? AST(SGOT)/ALT(SGPT) ?3 ?? institutional ULN  ? glomerular filtration rate (GFR) ?60 mL/min (based on the calculated CKD-EPI glomerular filtration rate estimation; see Appendix B)   Labs 01/16/23  ANC 3.5   Platelets 193   Total bili 0.4   AST 23   ALT 17   GFR >90    3.1.6 Creatine phosphokinase (CPK) elevation at the screening < Grade 2 [CPK ? 2.5 ULN].   5/24 CPK 109    3.1.7 Patients on a cholesterol lowering statin must be on a stable dose with no dose changes within 3 weeks prior to study start.   N/A    3.1.8 Human immunodeficiency virus (HIV)-infected patients on effective anti-retroviral therapy with undetectable viral load within 6 months are eligible for this trial as long as their anti-retroviral therapy does not have the potential for drug-drug interactions as judged by the treating investigator. Please refer to the General Concomitant Medication and Supportive Care Guidelines in Section 6.4.   N/A    3.1.9 For patients with evidence of chronic hepatitis B virus (HBV) infection, the HBV viral  load  must be undetectable on suppressive therapy, if indicated.   N/A    3.1.10 Patients with a history of hepatitis C virus (HCV) infection must have been treated and cured. For patients with HCV infection who are currently on treatment, they are eligible if they have an undetectable HCV viral N/A    3.1.11 Patients with treated brain metastases are eligible if follow-up brain imaging after at least 4 weeks following central nervous system (CNS)-directed therapy shows no evidence of progression.    None, N/A    3.1.12 Patients with a prior or concurrent malignancy whose natural history or treatment does not have the potential to interfere with the safety or efficacy assessment of the investigational regimen are eligible for this trial.    N/A    3.1.13 Patients with known history or current symptoms of cardiac disease, or history of treatment with cardiotoxic agents, should have a clinical risk assessment of cardiac function using the New York Heart Association Functional Classification. To be eligible for this trial, patients should be class 2B or better.    N/A    3.1.14 Patients must have lesions amenable to research biopsy for those enrolling to the expansion cohort. The biopsy should be deemed feasible and safe as noted in Appendix D for pre-biopsy lesion assessment criteria.    N/A, Escalation cohort    3.1.15 The effects of CA-4948, nab-paclitaxel, and gemcitabine on the developing human fetus are unknown. For this reason and because gemcitabine is known to be teratogenic, embryotoxic, and fetotoxic in mice and rabbits, women of child-bearing potential and men must agree to use adequate contraception (hormonal or barrier method of birth control; abstinence) prior to study entry, for the duration of study participation, and 6 months after completion of CA-4948, nab-paclitaxel, and gemcitabine administration. Should a woman become pregnant or suspect she is pregnant while she or her partner is participating in this study, she should inform her treating physician immediately. Men treated or enrolled on this protocol must also agree to use adequate contraception prior to the study, for the duration of study participation, and 3 months after completion of CA4948, nab-paclitaxel, and gemcitabine administration.    N/A not of child-bearing potential    3.1.16 Ability to understand and the willingness to sign a written informed consent document. Participants with impaired decision-making capacity who have a legally-authorized representative (LAR) and/or family member available will also be eligible.   Confirmed, signed consent 01/16/2023     3.2 Exclusion Criteria     3.2.1 Patients who have had chemotherapy or radiotherapy within 4 weeks (6 weeks for nitrosoureas or mitomycin C) prior to entering the study.    N/A, last tx=12/19/2022, C1D1 planned 01/23/2023     3.2.2 Patients who have not recovered from clinically significant adverse events due to prior anti-cancer therapy (i.e., have residual toxicities > Grade 1) with the exception of alopecia.   N/A    3.2.3 History of other malignancy with the exception of 1) malignancies for which all treatment was completed at least 2 years before registration and the patient has no evidence of disease; 2) or known indolent malignancies that do not require treatment and will likely not alter the course of treatment of disease under treatment.   N/A    3.2.4 History of allogeneic organ or stem cell transplant.    NONE    3.2.5 Current use or anticipated need for alternative, holistic, naturopathic, or botanical formulations used for the purpose of cancer treatment.    N/A  3.2.6 Patients who are receiving any other investigational agents.   N/A    3.2.7 History of allergic reactions attributed to compounds of similar chemical or biologic composition to CA-4948 or other agents used in study.    N/A - patient has venom-honey bee allergy and metronidazole allergy    3.2.8 Patients receiving any medications or substances that are inhibitors or inducers of CYP3A4 are ineligible due to CA-4948 and nab-paclitaxel. Because the lists of these agents are constantly changing, it is important to regularly consult a frequently-updated medical reference. As part of the enrollment/informed consent procedures, the patient will be counseled on the risk of interactions with other agents, and what to do if new medications need to be prescribed or if the patient is considering a new over-the-counter medicine or herbal product.    N/A - discussed at consent 01/16/23    3.2.9 Patients with uncontrolled intercurrent illness.    N/A    3.2.10 Pregnant women are excluded from this study because gemcitabine is nucleoside analogue with the potential for teratogenic or abortifacient effects. Because there is an unknown but potential risk for adverse events in nursing infants secondary to treatment of the mother with gemcitabine, breastfeeding should be discontinued if the mother is treated with gemcitabine. These potential risks may also apply to other agents used in this study.    N/A, not of child-bearing potential    3.2.11 Prolonged QTcF (>470 in females, >450 in males) on screening ECG.    QTcF=458ms 01/16/2023     3.2.12 Gastrointestinal condition which could impair absorption of CA-4948 or inability to ingest CA-4948.    N/A    3.2.13 Severe obstructive pulmonary disease or interstitial lung disease    N/A     3.2.14 History of rhabdomyolysis or elevated creatine phosphokinase (CPK).   N/A    3.3 Inclusion of Women and Minorities NIH policy requires that women and members of minority groups and their subpopulations be included in all NIH-supported biomedical and behavioral research projects involving NIH defined clinical research unless a clear and compelling rationale and justification establishes to the satisfaction of the funding International Paper (IC) Director that inclusion is inappropriate with respect to the health of the subjects or the purpose of the research. Exclusion under other circumstances must be designated by the Director, NIH, upon the recommendation of an IC Director based on a compelling rationale and justification. Cost is not an acceptable reason for exclusion except when the study would duplicate data from other sources. Women of childbearing potential should not be routinely excluded from participation in clinical research. Please see [email or web address].    Prior to Admission medications    Medication Dose, Route, Frequency Start Date Stop Date Indication Related to AE?   amlodipine (NORVASC) 10 MG tablet 10 mg, Oral, Daily (standard) 11/19/22        apixaban (ELIQUIS) 2.5 mg Tab 2.5 mg, Oral, 2 times a day (standard) 10/31/22        dexAMETHasone (DECADRON) 4 MG tablet 8 mg, Oral, Daily (standard), Take only on days 2, 3, and 4 of each 14-day chemotherapy cycle. 10/09/22        hydroCHLOROthiazide (HYDRODIURIL) 25 MG tablet 25 mg, Oral, for blood pressure 10/30/22        lidocaine-prilocaine (EMLA) 2.5-2.5 % cream Apply 30 minutes prior to port access. 10/03/22 10/02/23       lisinopril (PRINIVIL,ZESTRIL) 40 MG tablet TAKE 1 TABLET BY MOUTH ONCE DAILY FOR HIGH BLOOD PRESSURE 10/16/22  loperamide (IMODIUM) 2 mg capsule If having diarrhea, take 2 capsules (4 mg) after the first loose stool, then 1 capsule every 2 hours until diarrhea free for 12 hours. 08/04/22        metoPROLOL succinate (TOPROL-XL) 100 MG 24 hr tablet 100 mg, Oral, for high blood pressure 12/04/22        mirtazapine (REMERON) 7.5 MG tablet 7.5 mg, Oral, Nightly, To help appetite 01/02/23        ondansetron (ZOFRAN-ODT) 8 MG disintegrating tablet  10/13/22        oxyCODONE (ROXICODONE) 5 MG immediate release tablet 5 mg, Oral, Every 6 hours PRN 01/02/23 02/01/23       pediatric multivitamin no.49 (FLINTSTONES GUMMIES ORAL) 1 tablet, Oral, Daily (standard)         pegfilgrastim-cbqv (UDENYCA) 6 mg/0.6 mL injection Inject the contents of 1 syringe (6 mg) under the skin once on day 4 each chemotherapy cycle (24 hours after the completion of chemotherapy). 09/02/22        polyethylene glycol (GLYCOLAX) 17 gram/dose powder 17 g, Oral, Daily (standard) 12/20/21        prochlorperazine (COMPAZINE) 10 MG tablet 10 mg, Oral, Every 6 hours PRN 07/11/22

## 2023-01-22 MED FILL — UDENYCA 6 MG/0.6 ML SUBCUTANEOUS SYRINGE: SUBCUTANEOUS | 28 days supply | Qty: 1.2 | Fill #4

## 2023-01-22 NOTE — Unmapped (Signed)
(  Attempt 3 of 3)   Oncology Nurse Navigator attempted to reach patient to conduct pre-treatment assessment and [facility] enrollment. Patient unavailable, left voicemail for patient to return call.

## 2023-01-22 NOTE — Unmapped (Signed)
[facility]     CONSULTING PROVIDERS  [facility]  ____________________________________________________________________    CANCER HISTORY  Diagnosis: Pancreatic Adenocarcinoma, imaging concerning for metastatic disease to liver  1. 12/12/21: Presented with abdominal pain, weight loss found to have CBD obstruction with both pancreatic head and tail mass along with liver lesion. EUS/ERCP with CBD stent placement.  Biopsy of pancreatic tail mass with adenocarcinoma. Concern for metastatic disease with 1 cm liver lesion.  Also, local extension to left adrenal.   2. 12/26/21-12/2022 FOLFIRINOX (Cy 1 FOLFOX). Best response PR.   - 04/11/22 final oxaliplatin d/t neuropathy  - 12/2022 intrahepatic progresion  3. 01/23/23 start gemcitabine + abraxane     Molecular:  Somatic: KRAS, TP53, CCND3, TFEB, VEGFA, TMP 6.3 m/MB  Germline: negative   ____________________________________________________________________    ASSESSMENT  1. 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  Cancer associated abdominal pain -controlled  3. Opioid Induced Constipation -controlled  4. Chemo neuropathy, grade 1, ctm  5.  0.6 cm thrombus inferior tip of port - resolved    RECOMMENDATIONS  1. SOC: gemcitabine + abraxane    -Hold GCSF and add back if needed  2. Oxycodone 5mg  every 4 hours prn; ok to take 10 mg if pain is not adequately controlled  3. Continue Miralax and Senna  - consistent basis  4. Continue Eliquis 2.5mg  BID    5. RTC 2 weeks for labs, visit prior to second treatment    DISCUSSION  Discussed need for change in treatment plan to standard 2nd line palliative systemic therapy employing gemcitabine (1000 mg/m2) plus abraxane (125 mg/m2) on days 1 & 15 every 28 days.  We have discussed the rationale, potential benefits, and risks of this therapy today with possible side effects including but not limited to bone marrow suppression, fatigue and weakness, nausea, vomiting, diarrhea, or constipation; peripheral neuropathy from Abraxane which is progressive over time; alopecia; skin and nail changes; mucositis  / dermatitis; rare risk of allergic reaction or infusion reactions.    Medical decision making based high complexity of cancer, interpretation of cancer response, and treatment risk of complications and monitoring toxicity of chemotherapy.    ______________________________________________________________________  HISTORY     Norma Green is seen today at the [facility] for ongoing management regarding pancreatic cancer.      She comes in doing ok.  Abd pain comes and goes, taking her oxy, which helps, taking about 3 times a day.  Says she wakes in up in the middle of the night very hungry.  She did struggle with some constipation after scaling back on her bowel regimen to once a day, now started her bowel regimen more consistently twice a day.      MEDICAL HISTORY  1. HTN  2. Anxiety    Allergies   Allergen Reactions    Venom-Honey Bee Swelling    Metronidazole Nausea And Vomiting     Medications reviewed in the EMR    SOCIAL HISTORY  Lives with family including husband. Smoker. No alcohol or drug use.  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

## 2023-01-23 ENCOUNTER — Other Ambulatory Visit: Admit: 2023-01-23 | Discharge: 2023-01-24 | Payer: PRIVATE HEALTH INSURANCE

## 2023-01-23 ENCOUNTER — Ambulatory Visit: Admit: 2023-01-23 | Discharge: 2023-01-24 | Payer: PRIVATE HEALTH INSURANCE

## 2023-01-23 DIAGNOSIS — C259 Malignant neoplasm of pancreas, unspecified: Principal | ICD-10-CM

## 2023-01-23 DIAGNOSIS — G893 Neoplasm related pain (acute) (chronic): Principal | ICD-10-CM

## 2023-01-23 DIAGNOSIS — Z09 Encounter for follow-up examination after completed treatment for conditions other than malignant neoplasm: Principal | ICD-10-CM

## 2023-01-23 DIAGNOSIS — C787 Secondary malignant neoplasm of liver and intrahepatic bile duct: Principal | ICD-10-CM

## 2023-01-23 DIAGNOSIS — I8290 Acute embolism and thrombosis of unspecified vein: Principal | ICD-10-CM

## 2023-01-23 DIAGNOSIS — G620 Drug-induced polyneuropathy: Principal | ICD-10-CM

## 2023-01-23 DIAGNOSIS — K5900 Constipation, unspecified: Principal | ICD-10-CM

## 2023-01-23 DIAGNOSIS — T451X5A Adverse effect of antineoplastic and immunosuppressive drugs, initial encounter: Principal | ICD-10-CM

## 2023-01-23 LAB — CBC W/ AUTO DIFF
BASOPHILS ABSOLUTE COUNT: 0 10*9/L (ref 0.0–0.1)
BASOPHILS RELATIVE PERCENT: 0.5 %
EOSINOPHILS ABSOLUTE COUNT: 0.1 10*9/L (ref 0.0–0.5)
EOSINOPHILS RELATIVE PERCENT: 1.3 %
HEMATOCRIT: 34.5 % (ref 34.0–44.0)
HEMOGLOBIN: 11.4 g/dL (ref 11.3–14.9)
LYMPHOCYTES ABSOLUTE COUNT: 1.6 10*9/L (ref 1.1–3.6)
LYMPHOCYTES RELATIVE PERCENT: 24.8 %
MEAN CORPUSCULAR HEMOGLOBIN CONC: 32.9 g/dL (ref 32.0–36.0)
MEAN CORPUSCULAR HEMOGLOBIN: 26.6 pg (ref 25.9–32.4)
MEAN CORPUSCULAR VOLUME: 80.9 fL (ref 77.6–95.7)
MEAN PLATELET VOLUME: 7.6 fL (ref 6.8–10.7)
MONOCYTES ABSOLUTE COUNT: 0.5 10*9/L (ref 0.3–0.8)
MONOCYTES RELATIVE PERCENT: 8.2 %
NEUTROPHILS ABSOLUTE COUNT: 4.2 10*9/L (ref 1.8–7.8)
NEUTROPHILS RELATIVE PERCENT: 65.2 %
PLATELET COUNT: 174 10*9/L (ref 150–450)
RED BLOOD CELL COUNT: 4.27 10*12/L (ref 3.95–5.13)
RED CELL DISTRIBUTION WIDTH: 15.9 % — ABNORMAL HIGH (ref 12.2–15.2)
WBC ADJUSTED: 6.4 10*9/L (ref 3.6–11.2)

## 2023-01-23 LAB — COMPREHENSIVE METABOLIC PANEL
ALBUMIN: 3.4 g/dL (ref 3.4–5.0)
ALKALINE PHOSPHATASE: 161 U/L — ABNORMAL HIGH (ref 46–116)
ALT (SGPT): 18 U/L (ref 10–49)
ANION GAP: 3 mmol/L — ABNORMAL LOW (ref 5–14)
AST (SGOT): 24 U/L (ref <=34)
BILIRUBIN TOTAL: 0.6 mg/dL (ref 0.3–1.2)
BLOOD UREA NITROGEN: 11 mg/dL (ref 9–23)
BUN / CREAT RATIO: 17
CALCIUM: 9.4 mg/dL (ref 8.7–10.4)
CHLORIDE: 108 mmol/L — ABNORMAL HIGH (ref 98–107)
CO2: 30 mmol/L (ref 20.0–31.0)
CREATININE: 0.66 mg/dL
EGFR CKD-EPI (2021) FEMALE: >90 mL/min/{1.73_m2} (ref >=60)
GLUCOSE RANDOM: 183 mg/dL — ABNORMAL HIGH (ref 70–179)
POTASSIUM: 3.8 mmol/L (ref 3.5–5.1)
PROTEIN TOTAL: 6.9 g/dL (ref 5.7–8.2)
SODIUM: 141 mmol/L (ref 135–145)

## 2023-01-23 LAB — MAGNESIUM: MAGNESIUM: 1.9 mg/dL (ref 1.6–2.6)

## 2023-01-23 LAB — CANCER ANTIGEN 19-9: CA 19-9: 21091.26 U/mL — ABNORMAL HIGH (ref 0–35)

## 2023-01-23 MED ADMIN — gemcitabine (GEMZAR) 1,852 mg in sodium chloride (NS) 0.9 % 250 mL IVPB: 1000 mg/m2 | INTRAVENOUS | @ 15:00:00 | Stop: 2023-01-23

## 2023-01-23 MED ADMIN — prochlorperazine (COMPAZINE) tablet 10 mg: 10 mg | ORAL | @ 13:00:00

## 2023-01-23 MED ADMIN — sodium chloride (NS) 0.9 % infusion: 100 mL/h | INTRAVENOUS | @ 14:00:00

## 2023-01-23 MED ADMIN — heparin, porcine (PF) 100 unit/mL injection 500 Units: 500 [IU] | INTRAVENOUS | @ 17:00:00 | Stop: 2023-01-24

## 2023-01-23 MED ADMIN — dexAMETHasone (DECADRON) tablet 8 mg: 8 mg | ORAL | @ 13:00:00 | Stop: 2023-01-23

## 2023-01-23 MED ADMIN — PACLitaxel protein-bound (ABRAXANE) IVPB 231.25 mg: 125 mg/m2 | INTRAVENOUS | @ 14:00:00 | Stop: 2023-01-23

## 2023-01-23 NOTE — Unmapped (Signed)
0900  Pt arrived for scheduled infusion. PT returned home pump to infusion staff. Pt tolerated treatment and discharged home with family.

## 2023-01-23 NOTE — Unmapped (Signed)
Appointment on 01/23/2023   Component Date Value Ref Range Status    EKG Ventricular Rate 01/23/2023 56  BPM Incomplete    EKG Atrial Rate 01/23/2023 56  BPM Incomplete    EKG P-R Interval 01/23/2023 166  ms Incomplete    EKG QRS Duration 01/23/2023 82  ms Incomplete    EKG Q-T Interval 01/23/2023 446  ms Incomplete    EKG QTC Calculation 01/23/2023 430  ms Incomplete    EKG Calculated P Axis 01/23/2023 69  degrees Incomplete    EKG Calculated R Axis 01/23/2023 71  degrees Incomplete    EKG Calculated T Axis 01/23/2023 61  degrees Incomplete    QTC Fredericia 01/23/2023 436  ms Incomplete   Lab on 01/23/2023   Component Date Value Ref Range Status    Sodium 01/23/2023 141  135 - 145 mmol/L Final    Potassium 01/23/2023 3.8  3.5 - 5.1 mmol/L Final    Chloride 01/23/2023 108 (H)  98 - 107 mmol/L Final    CO2 01/23/2023 30.0  20.0 - 31.0 mmol/L Final    Anion Gap 01/23/2023 3 (L)  5 - 14 mmol/L Final    BUN 01/23/2023 11  9 - 23 mg/dL Final    Creatinine 16/05/9603 0.66  0.55 - 1.02 mg/dL Final    BUN/Creatinine Ratio 01/23/2023 17   Final    eGFR CKD-EPI (2021) Female 01/23/2023 >90  >=60 mL/min/1.37m2 Final    eGFR calculated with CKD-EPI 2021 equation in accordance with [employer] and [employer] of Nephrology Task Force recommendations.    Glucose 01/23/2023 183 (H)  70 - 179 mg/dL Final    Calcium 54/04/8118 9.4  8.7 - 10.4 mg/dL Final    Albumin 14/78/2956 3.4  3.4 - 5.0 g/dL Final    Total Protein 01/23/2023 6.9  5.7 - 8.2 g/dL Final    Total Bilirubin 01/23/2023 0.6  0.3 - 1.2 mg/dL Final    AST 21/30/8657 24  <=34 U/L Final    ALT 01/23/2023 18  10 - 49 U/L Final    Alkaline Phosphatase 01/23/2023 161 (H)  46 - 116 U/L Final    Magnesium 01/23/2023 1.9  1.6 - 2.6 mg/dL Final    CA 84-6 96/29/5284 21,091.26 (H)  0 - 35 U/mL Final    WBC 01/23/2023 6.4  3.6 - 11.2 10*9/L Final    RBC 01/23/2023 4.27  3.95 - 5.13 10*12/L Final    HGB 01/23/2023 11.4  11.3 - 14.9 g/dL Final    HCT 13/24/4010 34.5  34.0 - 44.0 % Final    MCV 01/23/2023 80.9  77.6 - 95.7 fL Final    MCH 01/23/2023 26.6  25.9 - 32.4 pg Final    MCHC 01/23/2023 32.9  32.0 - 36.0 g/dL Final    RDW 27/25/3664 15.9 (H)  12.2 - 15.2 % Final    MPV 01/23/2023 7.6  6.8 - 10.7 fL Final    Platelet 01/23/2023 174  150 - 450 10*9/L Final    Neutrophils % 01/23/2023 65.2  % Final    Lymphocytes % 01/23/2023 24.8  % Final    Monocytes % 01/23/2023 8.2  % Final    Eosinophils % 01/23/2023 1.3  % Final    Basophils % 01/23/2023 0.5  % Final    Absolute Neutrophils 01/23/2023 4.2  1.8 - 7.8 10*9/L Final    Absolute Lymphocytes 01/23/2023 1.6  1.1 - 3.6 10*9/L Final    Absolute Monocytes 01/23/2023 0.5  0.3 -  0.8 10*9/L Final    Absolute Eosinophils 01/23/2023 0.1  0.0 - 0.5 10*9/L Final    Absolute Basophils 01/23/2023 0.0  0.0 - 0.1 10*9/L Final    Hypochromasia 01/23/2023 Slight (A)  Not Present Final

## 2023-01-30 MED ORDER — OXYCODONE 5 MG TABLET
ORAL_TABLET | Freq: Four times a day (QID) | ORAL | 0 refills | 23 days | Status: CP | PRN
Start: 2023-01-30 — End: 2023-03-01

## 2023-01-30 NOTE — Unmapped (Signed)
error 

## 2023-01-30 NOTE — Unmapped (Signed)
RN Navigator attempted to contact pt to check on symptoms after starting Gem Abraxane on 5/31. No answer. Reached identified VM. Left message and encouraged call back if needed.

## 2023-01-30 NOTE — Unmapped (Signed)
Hi,    Patient Norma Green called requesting a medication refill for the following:    Medication: Oxycodone  Dosage: 5 mg  Days left of medication: 0  Pharmacy: [facility]    The expected turnaround time is 3-4 business days       Check Indicates criteria has been reviewed and confirmed with the patient:    [x]  Preferred Name   [x]  DOB and/or MR#  [x]  Preferred Contact Method  [x]  Phone Number(s)   [x]  Preferred Pharmacy   []  MyChart     Thank you,  Christell Faith  [facility]  [phone]

## 2023-02-05 NOTE — Unmapped (Unsigned)
[facility]     CONSULTING PROVIDERS  [facility]  ____________________________________________________________________    CANCER HISTORY  Diagnosis: Pancreatic Adenocarcinoma, imaging concerning for metastatic disease to liver  1. 12/12/21: Presented with abdominal pain, weight loss found to have CBD obstruction with both pancreatic head and tail mass along with liver lesion. EUS/ERCP with CBD stent placement.  Biopsy of pancreatic tail mass with adenocarcinoma. Concern for metastatic disease with 1 cm liver lesion.  Also, local extension to left adrenal.   2. 12/26/21-12/2022 FOLFIRINOX (Cy 1 FOLFOX). Best response PR.   - 04/11/22 final oxaliplatin d/t neuropathy  - 12/2022 intrahepatic progresion  3. 01/23/23 start gemcitabine + abraxane     Molecular:  Somatic: KRAS, TP53, CCND3, TFEB, VEGFA, TMP 6.3 m/MB  Germline: negative   ____________________________________________________________________    ASSESSMENT  1. 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  Cancer associated abdominal pain -controlled  3. Opioid Induced Constipation -controlled  4. Chemo neuropathy, grade 1, ctm  5.  0.6 cm thrombus inferior tip of port - resolved    RECOMMENDATIONS  1. SOC: gemcitabine + abraxane    -Hold GCSF and add back if needed  2. Oxycodone 5mg  every 4 hours prn; ok to take 10 mg if pain is not adequately controlled  3. Continue Miralax and Senna  - consistent basis  4. Continue Eliquis 2.5mg  BID    5. RTC 2 weeks for labs, visit prior to second treatment, 6 weeks for CT scan, labs, visit and tx    DISCUSSION      Medical decision making based high complexity of cancer, interpretation of cancer response, and treatment risk of complications and monitoring toxicity of chemotherapy.    ______________________________________________________________________  HISTORY     Norma Green is seen today at the [facility] for ongoing management regarding pancreatic cancer. She comes in doing ok.  Abd pain comes and goes, taking her oxy, which helps, taking about 3 times a day.  Says she wakes in up in the middle of the night very hungry.  She did struggle with some constipation after scaling back on her bowel regimen to once a day, now started her bowel regimen more consistently twice a day.      MEDICAL HISTORY  1. HTN  2. Anxiety    Allergies   Allergen Reactions    Venom-Honey Bee Swelling    Metronidazole Nausea And Vomiting     Medications reviewed in the EMR    SOCIAL HISTORY  Lives with family including husband. Smoker. No alcohol or drug use.  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    GENERAL: well developed, well nourished, in no distress  HEENT: NCAT, pupils equal, sclerae anicteric  PSYCH: full and appropriate range of affect with good insight and judgement  EXT: no edema  SKIN: No rashes    OBJECTIVE DATA  LABS  CBC, CMP reviewed, ok to start treatment    RADIOLOGY RESULTS   None today

## 2023-02-06 ENCOUNTER — Ambulatory Visit: Admit: 2023-02-06 | Discharge: 2023-02-07 | Payer: PRIVATE HEALTH INSURANCE

## 2023-02-06 ENCOUNTER — Other Ambulatory Visit: Admit: 2023-02-06 | Discharge: 2023-02-07 | Payer: PRIVATE HEALTH INSURANCE

## 2023-02-06 LAB — CBC W/ AUTO DIFF
BASOPHILS ABSOLUTE COUNT: 0 10*9/L (ref 0.0–0.1)
BASOPHILS RELATIVE PERCENT: 0.2 %
EOSINOPHILS ABSOLUTE COUNT: 0.1 10*9/L (ref 0.0–0.5)
EOSINOPHILS RELATIVE PERCENT: 2.3 %
HEMATOCRIT: 32.8 % — ABNORMAL LOW (ref 34.0–44.0)
HEMOGLOBIN: 10.8 g/dL — ABNORMAL LOW (ref 11.3–14.9)
LYMPHOCYTES ABSOLUTE COUNT: 1.8 10*9/L (ref 1.1–3.6)
LYMPHOCYTES RELATIVE PERCENT: 35 %
MEAN CORPUSCULAR HEMOGLOBIN CONC: 33 g/dL (ref 32.0–36.0)
MEAN CORPUSCULAR HEMOGLOBIN: 26.5 pg (ref 25.9–32.4)
MEAN CORPUSCULAR VOLUME: 80.4 fL (ref 77.6–95.7)
MEAN PLATELET VOLUME: 7.2 fL (ref 6.8–10.7)
MONOCYTES ABSOLUTE COUNT: 0.6 10*9/L (ref 0.3–0.8)
MONOCYTES RELATIVE PERCENT: 11.2 %
NEUTROPHILS ABSOLUTE COUNT: 2.6 10*9/L (ref 1.8–7.8)
NEUTROPHILS RELATIVE PERCENT: 51.3 %
PLATELET COUNT: 166 10*9/L (ref 150–450)
RED BLOOD CELL COUNT: 4.07 10*12/L (ref 3.95–5.13)
RED CELL DISTRIBUTION WIDTH: 15.5 % — ABNORMAL HIGH (ref 12.2–15.2)
WBC ADJUSTED: 5.1 10*9/L (ref 3.6–11.2)

## 2023-02-06 LAB — COMPREHENSIVE METABOLIC PANEL
ALBUMIN: 3.4 g/dL (ref 3.4–5.0)
ALKALINE PHOSPHATASE: 152 U/L — ABNORMAL HIGH (ref 46–116)
ALT (SGPT): 20 U/L (ref 10–49)
ANION GAP: 8 mmol/L (ref 5–14)
AST (SGOT): 26 U/L (ref <=34)
BILIRUBIN TOTAL: 0.5 mg/dL (ref 0.3–1.2)
BLOOD UREA NITROGEN: 13 mg/dL (ref 9–23)
BUN / CREAT RATIO: 18
CALCIUM: 9.5 mg/dL (ref 8.7–10.4)
CHLORIDE: 105 mmol/L (ref 98–107)
CO2: 27 mmol/L (ref 20.0–31.0)
CREATININE: 0.71 mg/dL
EGFR CKD-EPI (2021) FEMALE: >90 mL/min/{1.73_m2} (ref >=60)
GLUCOSE RANDOM: 128 mg/dL (ref 70–179)
POTASSIUM: 3.6 mmol/L (ref 3.4–4.8)
PROTEIN TOTAL: 7.4 g/dL (ref 5.7–8.2)
SODIUM: 140 mmol/L (ref 135–145)

## 2023-02-06 MED ORDER — OXYCODONE 5 MG TABLET
ORAL_TABLET | Freq: Four times a day (QID) | ORAL | 0 refills | 23.00000 days | Status: CP | PRN
Start: 2023-02-06 — End: ?

## 2023-02-06 MED ORDER — SENNOSIDES 8.6 MG TABLET
ORAL_TABLET | Freq: Two times a day (BID) | ORAL | 1 refills | 30.00000 days | Status: CP | PRN
Start: 2023-02-06 — End: 2023-04-07

## 2023-02-06 MED ADMIN — heparin, porcine (PF) 100 unit/mL injection 500 Units: 500 [IU] | INTRAVENOUS | @ 22:00:00 | Stop: 2023-02-07

## 2023-02-06 MED ADMIN — dexAMETHasone (DECADRON) tablet 8 mg: 8 mg | ORAL | @ 20:00:00 | Stop: 2023-02-06

## 2023-02-06 MED ADMIN — PACLitaxel protein-bound (ABRAXANE) IVPB 231.25 mg: 125 mg/m2 | INTRAVENOUS | @ 20:00:00 | Stop: 2023-02-06

## 2023-02-06 MED ADMIN — gemcitabine (GEMZAR) 1,852 mg in sodium chloride (NS) 0.9 % 250 mL IVPB: 1000 mg/m2 | INTRAVENOUS | @ 21:00:00 | Stop: 2023-02-06

## 2023-02-13 NOTE — Unmapped (Signed)
Norma Green has been contacted in regards to their refill of Udenyca. At this time, they have declined refill due to medication being on hold. Refill assessment call date has been updated per the patient's request.

## 2023-02-17 ENCOUNTER — Ambulatory Visit: Admit: 2023-02-17 | Payer: PRIVATE HEALTH INSURANCE

## 2023-02-17 NOTE — Unmapped (Signed)
I spoke with patient Norma Green to confirm appointments on the following date(s): 7/25 for CT scans in [facility] and 7/26 for labs, clinic and infusion.    [name]

## 2023-02-20 ENCOUNTER — Ambulatory Visit: Admit: 2023-02-20 | Discharge: 2023-02-21 | Payer: MEDICAID

## 2023-02-20 ENCOUNTER — Other Ambulatory Visit: Admit: 2023-02-20 | Discharge: 2023-02-21 | Payer: MEDICAID

## 2023-02-20 ENCOUNTER — Ambulatory Visit: Admit: 2023-02-20 | Discharge: 2023-02-21 | Payer: PRIVATE HEALTH INSURANCE

## 2023-02-20 DIAGNOSIS — I8290 Acute embolism and thrombosis of unspecified vein: Principal | ICD-10-CM

## 2023-02-20 DIAGNOSIS — C787 Secondary malignant neoplasm of liver and intrahepatic bile duct: Principal | ICD-10-CM

## 2023-02-20 DIAGNOSIS — K5900 Constipation, unspecified: Principal | ICD-10-CM

## 2023-02-20 DIAGNOSIS — C259 Malignant neoplasm of pancreas, unspecified: Principal | ICD-10-CM

## 2023-02-20 DIAGNOSIS — E876 Hypokalemia: Principal | ICD-10-CM

## 2023-02-20 DIAGNOSIS — G893 Neoplasm related pain (acute) (chronic): Principal | ICD-10-CM

## 2023-02-20 DIAGNOSIS — Z09 Encounter for follow-up examination after completed treatment for conditions other than malignant neoplasm: Principal | ICD-10-CM

## 2023-02-20 DIAGNOSIS — G620 Drug-induced polyneuropathy: Principal | ICD-10-CM

## 2023-02-20 DIAGNOSIS — T451X5A Adverse effect of antineoplastic and immunosuppressive drugs, initial encounter: Principal | ICD-10-CM

## 2023-02-20 LAB — CBC W/ AUTO DIFF
BASOPHILS ABSOLUTE COUNT: 0 10*9/L (ref 0.0–0.1)
BASOPHILS RELATIVE PERCENT: 0.2 %
EOSINOPHILS ABSOLUTE COUNT: 0 10*9/L (ref 0.0–0.5)
EOSINOPHILS RELATIVE PERCENT: 0.8 %
HEMATOCRIT: 29 % — ABNORMAL LOW (ref 34.0–44.0)
HEMOGLOBIN: 9.6 g/dL — ABNORMAL LOW (ref 11.3–14.9)
LYMPHOCYTES ABSOLUTE COUNT: 1.5 10*9/L (ref 1.1–3.6)
LYMPHOCYTES RELATIVE PERCENT: 31.1 %
MEAN CORPUSCULAR HEMOGLOBIN CONC: 33.1 g/dL (ref 32.0–36.0)
MEAN CORPUSCULAR HEMOGLOBIN: 26.6 pg (ref 25.9–32.4)
MEAN CORPUSCULAR VOLUME: 80.2 fL (ref 77.6–95.7)
MEAN PLATELET VOLUME: 7.6 fL (ref 6.8–10.7)
MONOCYTES ABSOLUTE COUNT: 0.6 10*9/L (ref 0.3–0.8)
MONOCYTES RELATIVE PERCENT: 11.7 %
NEUTROPHILS ABSOLUTE COUNT: 2.6 10*9/L (ref 1.8–7.8)
NEUTROPHILS RELATIVE PERCENT: 56.2 %
PLATELET COUNT: 204 10*9/L (ref 150–450)
RED BLOOD CELL COUNT: 3.62 10*12/L — ABNORMAL LOW (ref 3.95–5.13)
RED CELL DISTRIBUTION WIDTH: 14.6 % (ref 12.2–15.2)
WBC ADJUSTED: 4.7 10*9/L (ref 3.6–11.2)

## 2023-02-20 LAB — COMPREHENSIVE METABOLIC PANEL
ALBUMIN: 2.9 g/dL — ABNORMAL LOW (ref 3.4–5.0)
ALKALINE PHOSPHATASE: 169 U/L — ABNORMAL HIGH (ref 46–116)
ALT (SGPT): 29 U/L (ref 10–49)
ANION GAP: 9 mmol/L (ref 5–14)
AST (SGOT): 37 U/L — ABNORMAL HIGH (ref <=34)
BILIRUBIN TOTAL: 0.5 mg/dL (ref 0.3–1.2)
BLOOD UREA NITROGEN: 10 mg/dL (ref 9–23)
BUN / CREAT RATIO: 16
CALCIUM: 8.5 mg/dL — ABNORMAL LOW (ref 8.7–10.4)
CHLORIDE: 106 mmol/L (ref 98–107)
CO2: 28 mmol/L (ref 20.0–31.0)
CREATININE: 0.61 mg/dL
EGFR CKD-EPI (2021) FEMALE: >90 mL/min/{1.73_m2} (ref >=60)
GLUCOSE RANDOM: 234 mg/dL — ABNORMAL HIGH (ref 70–179)
POTASSIUM: 3.3 mmol/L — ABNORMAL LOW (ref 3.5–5.1)
PROTEIN TOTAL: 6.2 g/dL (ref 5.7–8.2)
SODIUM: 143 mmol/L (ref 135–145)

## 2023-02-20 LAB — CANCER ANTIGEN 19-9: CA 19-9: 44179.65 U/mL — ABNORMAL HIGH (ref 0–35)

## 2023-02-20 LAB — MAGNESIUM: MAGNESIUM: 1.7 mg/dL (ref 1.6–2.6)

## 2023-02-20 MED ADMIN — sodium chloride (NS) 0.9 % infusion: 100 mL/h | INTRAVENOUS | @ 21:00:00

## 2023-02-20 MED ADMIN — gemcitabine (GEMZAR) 1,852 mg in sodium chloride (NS) 0.9 % 250 mL IVPB: 1000 mg/m2 | INTRAVENOUS | @ 21:00:00 | Stop: 2023-02-20

## 2023-02-20 MED ADMIN — PACLitaxel protein-bound (ABRAXANE) IVPB 231.25 mg: 125 mg/m2 | INTRAVENOUS | @ 21:00:00 | Stop: 2023-02-20

## 2023-02-20 MED ADMIN — dexAMETHasone (DECADRON) tablet 8 mg: 8 mg | ORAL | @ 20:00:00 | Stop: 2023-02-20

## 2023-02-20 MED ADMIN — heparin, porcine (PF) 100 unit/mL injection 500 Units: 500 [IU] | INTRAVENOUS | @ 22:00:00 | Stop: 2023-02-21

## 2023-02-20 MED ADMIN — potassium chloride ER tablet 40 mEq: 40 meq | ORAL | @ 20:00:00 | Stop: 2023-02-20

## 2023-02-20 NOTE — Unmapped (Signed)
[facility]     CONSULTING PROVIDERS  [place] [facility] Team  ____________________________________________________________________    CANCER HISTORY  Diagnosis: Pancreatic Adenocarcinoma, imaging concerning for metastatic disease to liver  1. 12/12/21: Presented with abdominal pain, weight loss found to have CBD obstruction with both pancreatic head and tail mass along with liver lesion. EUS/ERCP with CBD stent placement.  Biopsy of pancreatic tail mass with adenocarcinoma. Concern for metastatic disease with 1 cm liver lesion.  Also, local extension to left adrenal.   2. 12/26/21-12/2022 FOLFIRINOX (Cy 1 FOLFOX). Best response PR.   - 04/11/22 final oxaliplatin d/t neuropathy  - 12/2022 intrahepatic progresion  3. 01/23/23 start gemcitabine + abraxane     Molecular:  Somatic: KRAS, TP53, CCND3, TFEB, VEGFA, TMP 6.3 m/MB  Germline: negative   ____________________________________________________________________    ASSESSMENT  1. 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  Cancer associated abdominal pain -controlled  3. Opioid Induced Constipation -controlled  4. Chemo neuropathy, grade 1, ctm  5.  0.6 cm thrombus inferior tip of port - resolved    RECOMMENDATIONS  1. SOC: gemcitabine + abraxane    -Hold GCSF and add back if needed  2. Oxycodone 5mg  every 4 hours prn; ok to take 10 mg if pain is not adequately controlled  3. Continue Miralax and Senna  - consistent basis  4. Continue Eliquis 2.5mg  BID    5. RTC 2 weeks for labs, treatment, 4 weeks for CT scan, labs, visit and tx    DISCUSSION  Norma Green is doing fair, feels she is tolerating her chemo treatments ok, not quite as much energy as in the past.  It is also a stressful time right now with her husband in ICU.    Continue same treatment plan, scan coming up in a few weeks.      Medical decision making based high complexity of cancer, interpretation of cancer response, and treatment risk of complications and monitoring toxicity of chemotherapy.    ______________________________________________________________________  HISTORY     Norma Green is seen today at the [facility] for ongoing management regarding pancreatic cancer.      She comes in doing ok.  Having back pain down the middle and then low back, comes and goes.  Family can give her a back rub and it gets better.  Abd pain comes and goes, but lower abd.  Taking oxycodone more frequently, ~every 6 hours.  Taking miralax 1-2 times a day, feels she is moving her bowels ok.  Appetite not great at time, sometimes food doesn't taste good.      Her husband is currently in the ICU on a vent after COPD exacerbation.      MEDICAL HISTORY  1. HTN  2. Anxiety    Allergies   Allergen Reactions    Venom-Honey Bee Swelling    Metronidazole Nausea And Vomiting     Medications reviewed in the EMR    SOCIAL HISTORY  Lives with family including husband. Smoker. No alcohol or drug use.  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

## 2023-02-20 NOTE — Unmapped (Unsigned)
Port accessed.  Labs drawn & sent for analysis.  Patient sent to next appointment. Care provided by Patrick Rodriguez, RN.

## 2023-02-21 NOTE — Unmapped (Signed)
Received report from [facility], patient infusing Doxil, tolerated remainder of treatment well, AVS declined, medport heparin flushed and needle removed. Discharged home.

## 2023-02-21 NOTE — Unmapped (Signed)
Lab on 02/20/2023   Component Date Value Ref Range Status    Sodium 02/20/2023 143  135 - 145 mmol/L Final    Potassium 02/20/2023 3.3 (L)  3.5 - 5.1 mmol/L Final    Chloride 02/20/2023 106  98 - 107 mmol/L Final    CO2 02/20/2023 28.0  20.0 - 31.0 mmol/L Final    Anion Gap 02/20/2023 9  5 - 14 mmol/L Final    BUN 02/20/2023 10  9 - 23 mg/dL Final    Creatinine 16/05/9603 0.61  0.55 - 1.02 mg/dL Final    BUN/Creatinine Ratio 02/20/2023 16   Final    eGFR CKD-EPI (2021) Female 02/20/2023 >90  >=60 mL/min/1.48m2 Final    eGFR calculated with CKD-EPI 2021 equation in accordance with [employer] and [employer] of Nephrology Task Force recommendations.    Glucose 02/20/2023 234 (H)  70 - 179 mg/dL Final    Calcium 54/04/8118 8.5 (L)  8.7 - 10.4 mg/dL Final    Albumin 14/78/2956 2.9 (L)  3.4 - 5.0 g/dL Final    Total Protein 02/20/2023 6.2  5.7 - 8.2 g/dL Final    Total Bilirubin 02/20/2023 0.5  0.3 - 1.2 mg/dL Final    AST 21/30/8657 37 (H)  <=34 U/L Final    ALT 02/20/2023 29  10 - 49 U/L Final    Alkaline Phosphatase 02/20/2023 169 (H)  46 - 116 U/L Final    Magnesium 02/20/2023 1.7  1.6 - 2.6 mg/dL Final    CA 84-6 96/29/5284 44,179.65 (H)  0 - 35 U/mL Final    WBC 02/20/2023 4.7  3.6 - 11.2 10*9/L Final    RBC 02/20/2023 3.62 (L)  3.95 - 5.13 10*12/L Final    HGB 02/20/2023 9.6 (L)  11.3 - 14.9 g/dL Final    HCT 13/24/4010 29.0 (L)  34.0 - 44.0 % Final    MCV 02/20/2023 80.2  77.6 - 95.7 fL Final    MCH 02/20/2023 26.6  25.9 - 32.4 pg Final    MCHC 02/20/2023 33.1  32.0 - 36.0 g/dL Final    RDW 27/25/3664 14.6  12.2 - 15.2 % Final    MPV 02/20/2023 7.6  6.8 - 10.7 fL Final    Platelet 02/20/2023 204  150 - 450 10*9/L Final    Neutrophils % 02/20/2023 56.2  % Final    Lymphocytes % 02/20/2023 31.1  % Final    Monocytes % 02/20/2023 11.7  % Final    Eosinophils % 02/20/2023 0.8  % Final    Basophils % 02/20/2023 0.2  % Final    Absolute Neutrophils 02/20/2023 2.6  1.8 - 7.8 10*9/L Final Absolute Lymphocytes 02/20/2023 1.5  1.1 - 3.6 10*9/L Final    Absolute Monocytes 02/20/2023 0.6  0.3 - 0.8 10*9/L Final    Absolute Eosinophils 02/20/2023 0.0  0.0 - 0.5 10*9/L Final    Absolute Basophils 02/20/2023 0.0  0.0 - 0.1 10*9/L Final    Hypochromasia 02/20/2023 Slight (A)  Not Present Final

## 2023-02-23 DIAGNOSIS — G893 Neoplasm related pain (acute) (chronic): Principal | ICD-10-CM

## 2023-02-23 MED ORDER — OXYCODONE 5 MG TABLET
ORAL_TABLET | ORAL | 0 refills | 20 days | Status: CP | PRN
Start: 2023-02-23 — End: ?

## 2023-02-23 NOTE — Unmapped (Signed)
RN returned called patient dtr in response to her VM today re refill for oxy 5 mg. Provider had already sent a Rx to her [facility] for her to fill scheduled for 7/5. Dtr says she's out of oxy 5 mg, took last pill yesterday, takes normally Q6 sometimes overnight, and has been hurting a little more, intermittently with increases activity, and has taken sometimes 2 tabs worse days, but regularly takes 1 tab. Rn discussed  Oupatient Oncology Palliaitive Care policy re: early refill and requested pt to call clinic in future before self increasing,   PMP reviewed and last Oxy 5 mg refilled on 6/7 for a 23 days supply, message sent to provider for review

## 2023-02-23 NOTE — Unmapped (Signed)
Called Norma Green to review that tumor marker increased quite a bit and want to plan to scan her sooner - will request scan and visit for 7/12 when she is due to come back.

## 2023-02-24 NOTE — Unmapped (Signed)
Called and confirmed that Scan has been moved up to 7/05 at 3:40pm. I confirmed that she will see Dr.Sanoff on 7/12 but we are working on getting her put on Dr.Sanoff scheduled but I would call her back with updated times.

## 2023-02-27 ENCOUNTER — Ambulatory Visit: Admit: 2023-02-27 | Discharge: 2023-02-28 | Payer: MEDICAID

## 2023-02-27 MED ADMIN — iohexol (OMNIPAQUE) 350 mg iodine/mL solution 100 mL: 100 mL | INTRAVENOUS | @ 19:00:00 | Stop: 2023-02-27

## 2023-03-04 NOTE — Unmapped (Signed)
Norma Green     CONSULTING PROVIDERS  [place] [facility] Team  ____________________________________________________________________    CANCER HISTORY  Diagnosis: Pancreatic Adenocarcinoma, imaging concerning for metastatic disease to liver  1. 12/12/21: Presented with abdominal pain, weight loss found to have CBD obstruction with both pancreatic head and tail mass along with liver lesion. EUS/ERCP with CBD stent placement.  Biopsy of pancreatic tail mass with adenocarcinoma. Concern for metastatic disease with 1 cm liver lesion.  Also, local extension to left adrenal.   2. 12/26/21-12/2022 FOLFIRINOX (Cy 1 FOLFOX). Best response PR.   - 04/11/22 final oxaliplatin d/t neuropathy  - 12/2022 intrahepatic progresion  3. 01/23/23 -01/2023 gemcitabine + abraxane. Best response PD.   4. 03/13/23 FOLFOX (planned)    Molecular:  Somatic: KRAS, TP53, CCND3, TFEB, VEGFA, TMP 6.3 m/MB  Germline: negative   ____________________________________________________________________    ASSESSMENT  1. 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 Cancer associated abdominal pain -controlled  3. Opioid Induced Constipation -controlled  4. Chemo neuropathy, grade 1, ctm  5. ECOG PS 1    RECOMMENDATIONS  1. Trial of FOLFOX re-exposure  2. Oxycodone 5mg  every 4 hours prn; ok to take 10 mg if pain is not adequately controlled  3. Continue Miralax and Senna  - consistent basis  4. Continue Eliquis 2.5mg  BID    5. RTC  1 week chemo. 3 weeks visit and chemo    DISCUSSION    Advance Care Planning     Participants: Norma Green and daughter  Discussion/Serious Illness Conversation; we reviewed today that her CT scan showed her current chemothreapy is not working and that the only option I have to offer her is to go back to the FOLFOX she had before, but that will bring back the neuropathy. After discussing she wants to do this.     I shared that I am worried about her given this scan result and asked permission to discuss my concerns about the future. She very much did not want to discuss any further bad news or upsetting prognostication at present time and was very distressed.     We discussed stress relief strategies. She relies mostly on prayer. Feels it would be helpful to have time alone I quiet to do that, and discussed how to make that happen with her and her daughter.    Health Care Decision Maker as of 03/10/2023    HCDM (patient stated preference): Norma Green - Son - [phone]         Medical decision making based high complexity of cancer, interpretation of cancer response, and treatment risk of complications and monitoring toxicity of chemotherapy.    ______________________________________________________________________  HISTORY     Norma Green is seen today at the [facility] for ongoing management regarding pancreatic cancer.       Doing ok- pain has been a bit more but does still get relief from her oxy.   Bowels moving ok, feels she has under control  Appetite has not been quite as robust.   Sleeping a bit more.   Not much nausea.     Continues to have extensive stress beyond her acncer-- husband had to be reintubated last night    MEDICAL HISTORY  1. HTN  2. Anxiety    Allergies   Allergen Reactions    Venom-Honey Bee Swelling    Metronidazole Nausea And Vomiting     Medications reviewed in the EMR    SOCIAL HISTORY  Lives with family including husband. Smoker. No alcohol or drug use.  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  GENERAL: well developed, well nourished, in no distress  HEENT: NCAT, pupils equal, sclerae anicteric  PSYCH: full and appropriate range of affect with good insight and judgement  EXT: no edema  SKIN: No rashes    OBJECTIVE DATA  LABS  CBC, CMP reviewed, marked increase ca 19-9    RADIOLOGY RESULTS   Ct from a few days ago (Scans personally reviewed by me and my interpretation as follows) -- increasing liver mets

## 2023-03-06 DIAGNOSIS — C259 Malignant neoplasm of pancreas, unspecified: Principal | ICD-10-CM

## 2023-03-06 DIAGNOSIS — C787 Secondary malignant neoplasm of liver and intrahepatic bile duct: Principal | ICD-10-CM

## 2023-03-06 LAB — COMPREHENSIVE METABOLIC PANEL
ALBUMIN: 3.1 g/dL — ABNORMAL LOW (ref 3.4–5.0)
ALKALINE PHOSPHATASE: 202 U/L — ABNORMAL HIGH (ref 46–116)
ALT (SGPT): 38 U/L (ref 10–49)
ANION GAP: 5 mmol/L (ref 5–14)
AST (SGOT): 28 U/L (ref <=34)
BILIRUBIN TOTAL: 0.5 mg/dL (ref 0.3–1.2)
BLOOD UREA NITROGEN: 9 mg/dL (ref 9–23)
BUN / CREAT RATIO: 15
CALCIUM: 9.3 mg/dL (ref 8.7–10.4)
CHLORIDE: 105 mmol/L (ref 98–107)
CO2: 31 mmol/L (ref 20.0–31.0)
CREATININE: 0.62 mg/dL
EGFR CKD-EPI (2021) FEMALE: >90 mL/min/{1.73_m2} (ref >=60)
GLUCOSE RANDOM: 110 mg/dL (ref 70–179)
POTASSIUM: 3.4 mmol/L (ref 3.4–4.8)
PROTEIN TOTAL: 7.1 g/dL (ref 5.7–8.2)
SODIUM: 141 mmol/L (ref 135–145)

## 2023-03-06 LAB — CBC W/ AUTO DIFF
BASOPHILS ABSOLUTE COUNT: 0 10*9/L (ref 0.0–0.1)
BASOPHILS RELATIVE PERCENT: 0.5 %
EOSINOPHILS ABSOLUTE COUNT: 0.1 10*9/L (ref 0.0–0.5)
EOSINOPHILS RELATIVE PERCENT: 1.9 %
HEMATOCRIT: 28 % — ABNORMAL LOW (ref 34.0–44.0)
HEMOGLOBIN: 9.2 g/dL — ABNORMAL LOW (ref 11.3–14.9)
LYMPHOCYTES ABSOLUTE COUNT: 1.9 10*9/L (ref 1.1–3.6)
LYMPHOCYTES RELATIVE PERCENT: 32.8 %
MEAN CORPUSCULAR HEMOGLOBIN CONC: 32.9 g/dL (ref 32.0–36.0)
MEAN CORPUSCULAR HEMOGLOBIN: 26.9 pg (ref 25.9–32.4)
MEAN CORPUSCULAR VOLUME: 81.5 fL (ref 77.6–95.7)
MEAN PLATELET VOLUME: 7.7 fL (ref 6.8–10.7)
MONOCYTES ABSOLUTE COUNT: 0.9 10*9/L — ABNORMAL HIGH (ref 0.3–0.8)
MONOCYTES RELATIVE PERCENT: 16.1 %
NEUTROPHILS ABSOLUTE COUNT: 2.8 10*9/L (ref 1.8–7.8)
NEUTROPHILS RELATIVE PERCENT: 48.7 %
PLATELET COUNT: 212 10*9/L (ref 150–450)
RED BLOOD CELL COUNT: 3.43 10*12/L — ABNORMAL LOW (ref 3.95–5.13)
RED CELL DISTRIBUTION WIDTH: 15.4 % — ABNORMAL HIGH (ref 12.2–15.2)
WBC ADJUSTED: 5.8 10*9/L (ref 3.6–11.2)

## 2023-03-06 MED ORDER — LACTULOSE 20 GRAM/30 ML ORAL SOLUTION
Freq: Four times a day (QID) | ORAL | 0 refills | 8.00000 days | Status: CP | PRN
Start: 2023-03-06 — End: 2023-04-05

## 2023-03-06 MED ADMIN — heparin, porcine (PF) 100 unit/mL injection 500 Units: 500 [IU] | INTRAVENOUS | @ 20:00:00 | Stop: 2023-03-07

## 2023-03-06 NOTE — Unmapped (Unsigned)
Port accessed.  Labs drawn & sent for analysis. Line flushed & saline-locked. Patient sent to next appointment. Care provided By Glorianne Manchester, RN.

## 2023-03-07 ENCOUNTER — Ambulatory Visit: Admit: 2023-03-07 | Discharge: 2023-03-07 | Payer: MEDICAID

## 2023-03-07 ENCOUNTER — Ambulatory Visit
Admit: 2023-03-07 | Discharge: 2023-03-07 | Payer: MEDICAID | Attending: Hematology & Oncology | Primary: Hematology & Oncology

## 2023-03-07 ENCOUNTER — Other Ambulatory Visit: Admit: 2023-03-07 | Discharge: 2023-03-07 | Payer: MEDICAID

## 2023-03-12 NOTE — Unmapped (Signed)
OUTPATIENT ONCOLOGY PALLIATIVE CARE NOTE         Principal Diagnosis: Ms. Fiest is a 62 y.o. female with pancreatic adenocarcinoma, diagnosed in 11/2021. Disease sites include liver.     Assessment/Plan:     #Cancer-Related Pain: Abd  -Increase  oxycodone 5 mg every 4 hours as needed to oxycodone 5-10 mg every 4 hours as needed. Doesn't need a new script  -Our team can help write for opioids.  Pain contract not signed, but will get on next visit      #Potential for opioid-induced constipation  -Add prn lactulose-easier to take then miralax  -Continue MiraLAX-as needed. This medication is mush.  Maximum dose is 4 times/day.  -May need to start Senokot 1 tablet once a day a day as needed.  May increase Senokot by adding 1-2 extra tablets per day.  This medication is push.  Maximum dose is 8 tablets/day.  -For opioid-induced constipation need both mush and push medications.       #Goals of Care/Prognostic Awareness:  -Shared changing tx due to progression on scans. Her spouse is currently intubated here at [facility]  -Intentionally remaining positive despite the difficulties that are occurring presently in her life.  Has faith in her spiritual practice.  Daughter is highly supportive.      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 Would like to have her own [place].     #Advance Care Planning:  HCDM:   HCDM (patient stated preference): Evans,[name] - [phone]        # Controlled substances risk management.   - Patient does not have a signed pain medication agreement with our team.   - [place]CCS[place] database was reviewed today and it was appropriate.   - Urine drug screen was not performed at this visit. Findings: not applicable.   - Patient has received information about safe storage and administration of medications.       Current cancer-directed therapy:  gem/Abraxane     F/u: 8/16 during infusion.     ----------------------------------------    Oncology Team: Eula Fried DNP, Dr. Forbes Cellar and Ted Mcalpine RN  PCP: [facility], [place]      HPI: 62 y.o. 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 Recent hospitalization on 2/10 to 2/14 for hematemesis and melena and found to have MW tear and occluded CBD stent. Went to OR for EGD ans was found to have two MW tears at [employer] junction, s/p 2 clips. Also with E. Coli bacteremia iso stent occlusion and stent associated cholangitis. ERCP was performed on 2/12 with successful removal of stent obstruction.    Symptom Review:  Pain: abd-sore sensation  Fatigue: improving after hospitalization. Family is doing cooking and cleaning.   Mobility: no devices  Sleep: good  Appetite: good  Nausea: intermittent-controlled   Bowel function: daily-uses miralax every other day-has prn imodium after chemo  Dyspnea: denies  Mood: ok-has moments of feeling down      Interval history 03/06/23 CK, Ms Gerstenberger and Charmaine    -scans with dx primary in liver    Symptom Review:  General: Not so good. Spouse is intubated here at [facility] hospital now due to copd  Pain: controlled with prn oxy IR   Fatigue: energy is ok   Appetite: not so good.   Bowel function: slight constipation  Mood:  it has been hard emotionally     Palliative Performance Scale: 70% - Ambulation: Reduced /  unable to do normal work, some evidence of disease / Self-Care: Full / Intake: Normal or reduced / Level of Conscious: Full      Social History:   Lives with family-dtr-[name] [phone] and her grandchildren. Grandchildren are ages: 59, 2 and 6 months. Highly active in [employer] Volunteers to serve others in [employer]. 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   Hobbies: Her grandchildren    Objective     Opioid Risk Tool:    Female  Female    Family history of substance abuse      Alcohol  1  3    Illegal drugs  2  3    Rx drugs  4  4    Personal history of substance abuse      Alcohol  3  3    Illegal drugs  4  4    Rx drugs  5  5    Age between 16--45 years  1  1    History of preadolescent sexual abuse  3  0    Psychological disease      ADD, OCD, bipolar, schizophrenia  2  2    Depression  1  1       Total: na  (<3 low risk, 4-7 moderate risk, >8 high risk)    Hematology/Oncology History   Pancreatic adenocarcinoma (CMS-HCC)   12/13/2021 Initial Diagnosis    Pancreatic adenocarcinoma (CMS-HCC)     12/23/2021 -  Cancer Staged    Staging form: Exocrine Pancreas, AJCC 8th Edition  - Clinical: Stage IV (cM1) - Signed by Roni Bread, MD on 12/23/2021       12/26/2021 -  Chemotherapy    OP GI mFOLFIRINOX  oxaliplatin 85 mg/m2 IV on day 1, irinotecan 150 mg/m2 IV on day 1, leucovorin 400 mg/m2 IV on day 1, fluorouracil 2,400 mg/m2 CIVI over 46 hours on day 1, every 14 days         Past Medical History:   Diagnosis Date    Allergic rhinitis 12/30/2007    Hypertension     Murmur, heart     Sleep related leg cramps 11/03/2011    Tobacco use disorder 11/03/2011       Allergies:   Allergies   Allergen Reactions    Venom-Honey Bee Swelling    Metronidazole Nausea And Vomiting       Family History:  Cancer-related family history is negative for Breast cancer, Cancer, Colon cancer, and Ovarian cancer.  She indicated that her mother is deceased. She indicated that her father is deceased. She indicated that the status of her sister is unknown. She indicated that the status of her maternal grandmother is unknown. She indicated that the status of her maternal grandfather is unknown. She indicated that the status of her paternal grandmother is unknown. She indicated that the status of her paternal grandfather is unknown. She indicated that the status of her daughter is unknown. She indicated that the status of her neg hx is unknown. She indicated that the status of her other is unknown.      REVIEW OF SYSTEMS:  A comprehensive review of 10 systems was negative except for pertinent positives noted in HPI.    PHYSICAL EXAM:   Vital signs for this encounter: VS reviewed in EPIC.  GEN: Awake and alert, pleasant appearing female in no acute distress  HEENT: No scleral icterus. No facial asymmetry.   LUNGS:No increased work of breathing.  SKIN: No rashes, petechiae or jaundice noted on examined skin  MSK: No sarcopenia,   EXT: No edema noted of the lower extremities  NEURO: No focal deficits appreciated. No myoclonus.  PSYCH: Alert and oriented to person, place and time. Euthymic.           I personally spent 15 minutes face-to-face and non-face-to-face in the care of this patient, which includes all pre, intra, and post visit time on the date of service.  All documented time was specific to the E/M visit and does not include any procedures that may have been performed.    Pam Drown, FNP-BC, [facility]  [facility] Palliative [facility]   [facility]  [address] [place], [place] [address]

## 2023-03-16 NOTE — Unmapped (Signed)
Hi,     Charmaine(daughter) contacted the Communication Center requesting to speak with the care team of Norma Green to discuss:    -the patient's care.    Please contact Charmaine  at [phone] .      Thank you,   Durward Fortes  [facility]   [phone]

## 2023-03-16 NOTE — Unmapped (Signed)
Returned call to [employer].States she was calling to see when pt's appts were rescheduled.Discussed that they were rescheduled for 03-27-23.She is agreeable with this plan and will call back with any further questions/concerns.

## 2023-03-25 NOTE — Unmapped (Signed)
Clinical Pharmacist Brief Note:    Treatment plan start date adjusted to next infusion scheduled (8/2).     Trilby Drummer, PharmD, BCOP, CPP  GI Oncology Clinical Pharmacist Practitioner

## 2023-03-26 NOTE — Unmapped (Signed)
[place] [facility]     CONSULTING PROVIDERS   Biliary Team  ____________________________________________________________________    CANCER HISTORY  Diagnosis: Pancreatic Adenocarcinoma, imaging concerning for metastatic disease to liver  1. 12/12/21: Presented with abdominal pain, weight loss found to have CBD obstruction with both pancreatic head and tail mass along with liver lesion. EUS/ERCP with CBD stent placement.  Biopsy of pancreatic tail mass with adenocarcinoma. Concern for metastatic disease with 1 cm liver lesion.  Also, local extension to left adrenal.   2. 12/26/21-12/2022 FOLFIRINOX (Cy 1 FOLFOX). Best response PR.   - 04/11/22 final oxaliplatin d/t neuropathy  - 12/2022 intrahepatic progresion  3. 01/23/23 -01/2023 gemcitabine + abraxane. Best response PD.   4. 03/13/23 FOLFOX (planned)    Molecular:  Somatic: KRAS, TP53, CCND3, TFEB, VEGFA, TMP 6.3 m/MB  Germline: negative   ____________________________________________________________________    ASSESSMENT  1. 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 Cancer associated abdominal pain -controlled  3. Opioid Induced Constipation -controlled  4. Chemo neuropathy, grade 1, ctm  5. Anorexia  6. ECOG PS 1    RECOMMENDATIONS  1. Trial of FOLFOX re-exposure - reschedule for one week  2. Oxycodone 5mg  every 4 hours prn; ok to take 10 mg if pain is not adequately controlled  3. Continue Miralax and Senna  - consistent basis  4. Continue Eliquis 2.5mg  BID    5. Mirtazapine 7.5mg  at bedtime - had prescription, but wasn't taking.  New rx sent.  6. RTC 3 weeks for labs, visit and treatment    DISCUSSION  Norma Green felt she couldn't come for treatment today since she wasn't eating very well. Reviewed symptom management.  Reviewed rationale for mirtazapine - they looked through medicine bottles and couldn't find it, new rx sent to pharmacy.  Recommend starting and taking on a consistent basis.  Continue with boost, increase use of not eating meals, can increase to TID prn.  She would like to still proceed with treatment, will reschedule for next week.       Medical decision making based high complexity of cancer, interpretation of cancer response, and treatment risk of complications and monitoring toxicity of chemotherapy.    ______________________________________________________________________  HISTORY     Norma Green is seen today at the [facility] for ongoing management regarding pancreatic cancer.      She is seen via video visit today, canceled coming in person as she didn't feel well.  She is not eating well.  Gets hungry, but when she tries to eat, it is not appealing to her.  Only eating a few bites.  Some nausea, comes and goes.  Thinks she is losing weight.  Was worried that if she took chemo, she would feel worse.  She is trying to drink ensure.  +abd pain, and on side.  Taking oxycodone every 4 hours, and it helps, feels her pain is controlled.  Staying on her miralax, about every other day.      Husband is doing better, being discharged to rehab.      MEDICAL HISTORY  1. HTN  2. Anxiety    Allergies   Allergen Reactions    Venom-Honey Bee Swelling    Metronidazole Nausea And Vomiting     Medications reviewed in the EMR    SOCIAL HISTORY  Lives with family including husband. Smoker. No alcohol or drug use.  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 I conducted a audio/video visit. I spent 20 minutes on the video call with the patient. I spent an additional 15 minutes on pre- and post-visit activities on the date of service .

## 2023-03-27 ENCOUNTER — Telehealth: Admit: 2023-03-27 | Discharge: 2023-03-27 | Payer: MEDICAID

## 2023-03-27 ENCOUNTER — Ambulatory Visit: Admit: 2023-03-27 | Discharge: 2023-03-27 | Payer: MEDICAID

## 2023-03-27 DIAGNOSIS — R630 Anorexia: Principal | ICD-10-CM

## 2023-03-27 DIAGNOSIS — C259 Malignant neoplasm of pancreas, unspecified: Principal | ICD-10-CM

## 2023-03-27 DIAGNOSIS — C787 Secondary malignant neoplasm of liver and intrahepatic bile duct: Principal | ICD-10-CM

## 2023-03-27 DIAGNOSIS — Z79899 Other long term (current) drug therapy: Principal | ICD-10-CM

## 2023-03-27 DIAGNOSIS — G893 Neoplasm related pain (acute) (chronic): Principal | ICD-10-CM

## 2023-03-27 DIAGNOSIS — Z09 Encounter for follow-up examination after completed treatment for conditions other than malignant neoplasm: Principal | ICD-10-CM

## 2023-03-27 DIAGNOSIS — G620 Drug-induced polyneuropathy: Principal | ICD-10-CM

## 2023-03-27 DIAGNOSIS — T451X5A Adverse effect of antineoplastic and immunosuppressive drugs, initial encounter: Principal | ICD-10-CM

## 2023-03-27 MED ORDER — MIRTAZAPINE 7.5 MG TABLET
ORAL_TABLET | Freq: Every evening | ORAL | 6 refills | 30 days | Status: CP
Start: 2023-03-27 — End: ?

## 2023-03-27 NOTE — Unmapped (Signed)
Returned Call to Norma Green after receiving call from the communication center in reference to her symptoms and her phone call earlier that was to cancel her appt that were today.     Triage Recommendations:   I told her that I saw that she had cancelled and Eula Fried FNP wanted to know if she would be able to do a video visit at the same time as her original apt. Advised to take some small sips of water in the meantime to help her with hydration.     Caller's Response:   She says she was able to do a video visit and was appreciative of the call.      Outstanding tasks: Care team notified- Scheduler needs to switch appt to video visit.

## 2023-03-27 NOTE — Unmapped (Signed)
Hi,     Norma Green has contacted the Communication Center in regards to the following symptom:     Pain and loss of appetite, states that she is hungry but cannot eat, states that she has been vomiting just clear liquid    Please contact [place] at [phone]    Symptoms deemed to be Urgent are marked High Priority    Thank you,  [name]   [facility] Communication Center   [phone]

## 2023-03-27 NOTE — Unmapped (Signed)
Hi,    Patient Norma Green contacted the Communication Center to cancel their appointment for today.  The appointments need to be cancelled.    Cancellation Reason: not feeling well    Thank you,  [name]  [facility] Communication Center   [phone]

## 2023-04-02 DIAGNOSIS — G893 Neoplasm related pain (acute) (chronic): Principal | ICD-10-CM

## 2023-04-02 DIAGNOSIS — R630 Anorexia: Principal | ICD-10-CM

## 2023-04-02 DIAGNOSIS — R634 Abnormal weight loss: Principal | ICD-10-CM

## 2023-04-02 DIAGNOSIS — C259 Malignant neoplasm of pancreas, unspecified: Principal | ICD-10-CM

## 2023-04-02 MED ORDER — OXYCODONE 10 MG TABLET
ORAL_TABLET | ORAL | 0 refills | 30 days | Status: CP | PRN
Start: 2023-04-02 — End: ?

## 2023-04-02 MED ORDER — OXYCODONE ER 10 MG TABLET,CRUSH RESISTANT,EXTENDED RELEASE 12 HR
ORAL_TABLET | Freq: Two times a day (BID) | ORAL | 0 refills | 30 days | Status: CP
Start: 2023-04-02 — End: 2023-05-02

## 2023-04-02 NOTE — Unmapped (Addendum)
PA submitted for Oxycontin 10 mg    [place] [facility]    Approved:    Conf no: [id number]   PA Approval no: [id number]

## 2023-04-02 NOTE — Unmapped (Signed)
Boneta Lucks connected with Ms. Reisdorf.     She takes the oxy ir 2 tablet (10 mg) pretty much every 4 hours during waking hours, sometimes she take overnight, has #2 left as of now. BM every other day, no concerns about constipation can and should take miralax (2x/day)-- has same pain but comes and goes, hands get crampy things, when she does take mesds it does help , [facility] in [place] . She tried the [facility] but made her feel sick and so stopped it. SHe is tolerating 3 ensures a day which is good.    Plan:    -Stop remeron->made her feel sick  -Can consider starting olanzapine 5 mg at bedtime if not taking Compazine consistently.   -Consult to dietician  -Start OxyContin 10 mg twice daily  -Continue oxycodone 10 mg every 4 hours as needed  -Continue MiraLAX twice a day and up to 4 times per day as needed.  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    Burtis Junes, FNP-BC, [facility]  Outpatient Oncology Palliative Care Service  [facility]  [address], [place], [place] [address]  [phone]

## 2023-04-02 NOTE — Unmapped (Addendum)
Spoke to pt for check in:  taking the 2 tablets (10 mg) oxycodone pretty much every 4 hours during waking hours, sometimes she take overnight, has #2 left as of now.   Having BM every other day, no concerns about constipation takes Miralax BID, pain is the same, no worse but oxycodone is effective when taken. Her hands often get cramps.     She tried the [facility] but made her feel sick and so stopped it.     Tolerating 3 ensures a day     Return called caregiver to notify:  Provider Tresa Endo will start Oxycontin 10 mg BID, and olanzapine for appetite, will send for dietician referral too.   Caregiver has RN phoen number for any future issues that may come up

## 2023-04-02 NOTE — Unmapped (Signed)
Hi,    Patient's daughter Marion Downer called requesting a medication refill for the following:    Medication: Oxycodone  Dosage: 5mg   Days left of medication: 3 pills left  Pharmacy: [facility] [phone]    -Charmaine also requested a call back as well.        The expected turnaround time is 3-4 business days     Thank you,  Durward Fortes  [facility] Cancer Communication Center  [phone]

## 2023-04-03 ENCOUNTER — Ambulatory Visit: Admit: 2023-04-03 | Discharge: 2023-04-03 | Payer: MEDICAID | Attending: Registered" | Primary: Registered"

## 2023-04-03 ENCOUNTER — Ambulatory Visit: Admit: 2023-04-03 | Discharge: 2023-04-03 | Payer: MEDICAID

## 2023-04-03 ENCOUNTER — Other Ambulatory Visit: Admit: 2023-04-03 | Discharge: 2023-04-03 | Payer: MEDICAID

## 2023-04-03 DIAGNOSIS — C259 Malignant neoplasm of pancreas, unspecified: Principal | ICD-10-CM

## 2023-04-03 LAB — CBC W/ AUTO DIFF
BASOPHILS ABSOLUTE COUNT: 0.1 10*9/L (ref 0.0–0.1)
BASOPHILS RELATIVE PERCENT: 0.8 %
EOSINOPHILS ABSOLUTE COUNT: 0.3 10*9/L (ref 0.0–0.5)
EOSINOPHILS RELATIVE PERCENT: 3.3 %
HEMATOCRIT: 29.6 % — ABNORMAL LOW (ref 34.0–44.0)
HEMOGLOBIN: 9.6 g/dL — ABNORMAL LOW (ref 11.3–14.9)
LYMPHOCYTES ABSOLUTE COUNT: 2.6 10*9/L (ref 1.1–3.6)
LYMPHOCYTES RELATIVE PERCENT: 24.4 %
MEAN CORPUSCULAR HEMOGLOBIN CONC: 32.4 g/dL (ref 32.0–36.0)
MEAN CORPUSCULAR HEMOGLOBIN: 25.6 pg — ABNORMAL LOW (ref 25.9–32.4)
MEAN CORPUSCULAR VOLUME: 79.2 fL (ref 77.6–95.7)
MEAN PLATELET VOLUME: 7.6 fL (ref 6.8–10.7)
MONOCYTES ABSOLUTE COUNT: 1 10*9/L — ABNORMAL HIGH (ref 0.3–0.8)
MONOCYTES RELATIVE PERCENT: 9.6 %
NEUTROPHILS ABSOLUTE COUNT: 6.5 10*9/L (ref 1.8–7.8)
NEUTROPHILS RELATIVE PERCENT: 61.9 %
PLATELET COUNT: 230 10*9/L (ref 150–450)
RED BLOOD CELL COUNT: 3.73 10*12/L — ABNORMAL LOW (ref 3.95–5.13)
RED CELL DISTRIBUTION WIDTH: 16.7 % — ABNORMAL HIGH (ref 12.2–15.2)
WBC ADJUSTED: 10.5 10*9/L (ref 3.6–11.2)

## 2023-04-03 LAB — COMPREHENSIVE METABOLIC PANEL
ALBUMIN: 2.8 g/dL — ABNORMAL LOW (ref 3.4–5.0)
ALKALINE PHOSPHATASE: 203 U/L — ABNORMAL HIGH (ref 46–116)
ALT (SGPT): 17 U/L (ref 10–49)
ANION GAP: 5 mmol/L (ref 5–14)
AST (SGOT): 35 U/L — ABNORMAL HIGH (ref <=34)
BILIRUBIN TOTAL: 0.6 mg/dL (ref 0.3–1.2)
BLOOD UREA NITROGEN: 8 mg/dL — ABNORMAL LOW (ref 9–23)
BUN / CREAT RATIO: 15
CALCIUM: 9 mg/dL (ref 8.7–10.4)
CHLORIDE: 103 mmol/L (ref 98–107)
CO2: 31 mmol/L (ref 20.0–31.0)
CREATININE: 0.55 mg/dL
EGFR CKD-EPI (2021) FEMALE: >90 mL/min/{1.73_m2} (ref >=60)
GLUCOSE RANDOM: 121 mg/dL (ref 70–179)
POTASSIUM: 3.7 mmol/L (ref 3.5–5.1)
PROTEIN TOTAL: 6.8 g/dL (ref 5.7–8.2)
SODIUM: 139 mmol/L (ref 135–145)

## 2023-04-03 LAB — CANCER ANTIGEN 19-9: CA 19-9: >140000 U/mL — ABNORMAL HIGH (ref 0–35)

## 2023-04-03 LAB — MAGNESIUM: MAGNESIUM: 1.8 mg/dL (ref 1.6–2.6)

## 2023-04-03 MED ADMIN — prochlorperazine (COMPAZINE) tablet 10 mg: 10 mg | ORAL | @ 23:00:00

## 2023-04-03 MED ADMIN — dexAMETHasone (DECADRON) 4 mg/mL injection 12 mg: 12 mg | INTRAVENOUS | @ 19:00:00 | Stop: 2023-04-03

## 2023-04-03 MED ADMIN — ondansetron (ZOFRAN) injection 8 mg: 8 mg | INTRAVENOUS | @ 19:00:00 | Stop: 2023-04-03

## 2023-04-03 MED ADMIN — oxaliplatin (ELOXATIN) 147.05 mg in dextrose 5 % 250 mL IVPB: 85 mg/m2 | INTRAVENOUS | @ 21:00:00 | Stop: 2023-04-03

## 2023-04-03 MED ADMIN — fluorouracil (ADRUCIL) 4,150 mg in sodium chloride (NS) 0.9 % 46-hr infusion CADD: 2400 mg/m2 | INTRAVENOUS | @ 23:00:00

## 2023-04-03 MED ADMIN — leuCOVorin 692 mg in dextrose 5 % 50 mL IVPB: 400 mg/m2 | INTRAVENOUS | @ 21:00:00 | Stop: 2023-04-03

## 2023-04-03 NOTE — Unmapped (Signed)
[facility] Outpatient Nutrition Services   Medical Nutrition Therapy Consultation       Visit Type:    Return Assessment    Referral Reason:   Pancreatic adenocarcinoma        Norma Green is a 62 y.o. female seen for medical nutrition therapy poor appetite with weight loss. 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 Her active problem list, medication list, allergies, family history, social history, notes from last a few encounters, and lab results were reviewed.     Her interim medical history is significant for pancreatic cancer, HTN.    Anthropometrics   Height: 170.2 cm  Weight: 58.6 kg   BMI: 20 kg/m2    Wt Readings from Last 5 Encounters:   04/03/23 58 kg (127 lb 13.9 oz)   03/06/23 63 kg (138 lb 12.8 oz)   02/20/23 63.9 kg (140 lb 12.8 oz)   02/06/23 66.6 kg (146 lb 14.4 oz)   01/23/23 65.3 kg (144 lb)        Usual body weight: ~75 kg    Ideal Body Weight: 61.4 kg ([place])  Recent wt change: 5 kg wt loss in 1 month; 14.3 kg wt loss in 3 months  % wt change: 7.9% wt loss in 1 month; 19.8% wt loss in 3 months       Nutrition Risk Screening:   Food Insecurity: Food Insecurity Present (10/07/2022)    Hunger Vital Sign     Worried About Running Out of Food in the Last Year: Sometimes true     Ran Out of Food in the Last Year: Sometimes true        Nutrition Focused Physical Exam:    Nutrition Evaluation  Overall Impressions: Unable to perform Nutrition-Focused Physical Exam at this time due to (comment) (pt positioning and pain level at visit) (04/03/23 1604)  Nutrition Designation: Normal weight (BMI 18.50 - 24.99 kg/m2) (04/03/23 1604)     Malnutrition Screening:   Severe Protein-Calorie Malnutrition in the context of chronic illness (04/03/23 1606)    Biochemical Data, Medical Tests and Procedures:  All pertinent labs and imaging reviewed by Howie Ill, RD/LDN at 2:20 PM 04/03/2023.    Medications and Vitamin/Mineral Supplementation:   All nutritionally pertinent medications reviewed on 04/03/2023.   She is taking nutrition supplements: Flinstones multivitamin X 2 daily    Current Outpatient Medications   Medication Sig Dispense Refill    amlodipine (NORVASC) 10 MG tablet Take 1 tablet (10 mg total) by mouth daily.      apixaban (ELIQUIS) 2.5 mg Tab Take 1 tablet (2.5 mg total) by mouth two (2) times a day. 60 tablet 6    dexAMETHasone (DECADRON) 4 MG tablet Take 2 tablets (8 mg total) by mouth daily. Take only on days 2, 3, and 4 of each 14-day chemotherapy cycle. 12 tablet 5    heparin, porcine, PF, 100 unit/mL Syrg Infuse 5 mL into a venous catheter every fourteen (14) days. For use while patient is on fluorouracil (5-FU) home infusion.   Flush IV catheter with heparin 5 mL as directed after final saline flush per SASH method and as needed for line maintenance. 4 each PRN    hydroCHLOROthiazide (HYDRODIURIL) 25 MG tablet Take 1 tablet (25 mg total) by mouth. for blood pressure      lactulose 20 gram/30 mL Soln Take 15 mL (10 g total) by mouth four (4)  times a day as needed (constipation). Use this instead of the miralax 450 mL 0    lidocaine-prilocaine (EMLA) 2.5-2.5 % cream Apply 30 minutes prior to port access. 5 g 3    lisinopril (PRINIVIL,ZESTRIL) 40 MG tablet TAKE 1 TABLET BY MOUTH ONCE DAILY FOR HIGH BLOOD PRESSURE      loperamide (IMODIUM) 2 mg capsule If having diarrhea, take 2 capsules (4 mg) after the first loose stool, then 1 capsule every 2 hours until diarrhea free for 12 hours. 60 capsule 11    metoPROLOL succinate (TOPROL-XL) 100 MG 24 hr tablet Take 1 tablet (100 mg total) by mouth. for high blood pressure      ondansetron (ZOFRAN-ODT) 8 MG disintegrating tablet       oxyCODONE (OXYCONTIN) 10 mg TR12 12 hr crush resistant ER/CR tablet Take 1 tablet (10 mg total) by mouth every twelve (12) hours. 60 tablet 0    oxyCODONE (ROXICODONE) 10 mg immediate release tablet Take 1 tablet (10 mg total) by mouth every four (4) hours as needed for pain. 180 tablet 0    pediatric multivitamin no.49 (FLINTSTONES GUMMIES ORAL) Take 1 tablet by mouth daily.      pegfilgrastim-cbqv (UDENYCA) 6 mg/0.6 mL injection Inject the contents of 1 syringe (6 mg) under the skin once on day 4 each chemotherapy cycle (24 hours after the completion of chemotherapy). 1.2 mL 5    polyethylene glycol (GLYCOLAX) 17 gram/dose powder Take 17 g by mouth daily. 255 g 11    prochlorperazine (COMPAZINE) 10 MG tablet Take 1 tablet (10 mg total) by mouth every six (6) hours as needed (nausea). 30 tablet 11    senna (SENOKOT) 8.6 mg tablet Take 1 tablet by mouth two (2) times a day as needed for constipation. 60 tablet 1    sodium chloride (NS) 0.9 % injection Infuse 10 mL into a venous catheter every fourteen (14) days. For use while patient is on fluorouracil (5-FU) home infusion.   Flush IV catheter with normal saline 10 mL prior to and after infusion followed by heparin flush per SASH method and as needed for line maintenance. 6 each PRN     No current facility-administered medications for this visit.     Facility-Administered Medications Ordered in Other Visits   Medication Dose Route Frequency Provider Last Rate Last Admin    dexAMETHasone (DECADRON) 4 mg/mL injection 20 mg  20 mg Intravenous Once PRN Sanoff, Mabeline Caras, MD        dexAMETHasone (DECADRON) tablet 12 mg  12 mg Oral Once Sanoff, Mabeline Caras, MD        dextrose 5 % infusion  100 mL/hr Intravenous Continuous Sanoff, Mabeline Caras, MD        diphenhydrAMINE (BENADRYL) injection 25 mg  25 mg Intravenous Once PRN Sanoff, Mabeline Caras, MD        EPINEPHrine [facility]) injection 0.3 mg  0.3 mg Intramuscular Once PRN Sanoff, Mabeline Caras, MD        famotidine (PF) (PEPCID) injection 20 mg  20 mg Intravenous Once PRN Sanoff, Mabeline Caras, MD        fluorouracil (ADRUCIL) 4,150 mg in sodium chloride (NS) 0.9 % 46-hr infusion CADD  2,400 mg/m2 (Treatment Plan Recorded) Intravenous Once Sanoff, Mabeline Caras, MD        leuCOVorin 692 mg in dextrose 5 % 50 mL IVPB  400 mg/m2 (Treatment Plan Recorded) Intravenous Once Sanoff, Mabeline Caras, MD        methylPREDNISolone sodium succinate (  PF) (SOLU-Medrol) injection 125 mg  125 mg Intravenous Once PRN Sanoff, Mabeline Caras, MD        OKAY TO SEND MEDICATION/CHEMOTHERAPY TO OUTPATIENT UNIT   Other Once Sanoff, Mabeline Caras, MD        ondansetron [facility]) tablet 24 mg  24 mg Oral Once Sanoff, Mabeline Caras, MD        oxaliplatin (ELOXATIN) 147.05 mg in dextrose 5 % 250 mL IVPB  85 mg/m2 (Treatment Plan Recorded) Intravenous Once Sanoff, Mabeline Caras, MD        prochlorperazine (COMPAZINE) injection 10 mg  10 mg Intravenous Q6H PRN Sanoff, Mabeline Caras, MD        prochlorperazine (COMPAZINE) tablet 10 mg  10 mg Oral Q6H PRN Sanoff, Mabeline Caras, MD        sodium chloride (NS) 0.9 % infusion  20 mL/hr Intravenous Continuous PRN Sanoff, Mabeline Caras, MD        sodium chloride 0.9% (NS) bolus 1,000 mL  1,000 mL Intravenous Once PRN Sanoff, Mabeline Caras, MD           Nutrition History:     Dietary Restrictions: No known food allergies or food intolerances.     Gastrointestinal Issues: Denied issues    Oral Issues: bitter taste    Hunger and Satiety: Poor appetite and limited intake.     Food Safety and Access: She noted limited finances and resources.     Diet Recall:   Typically eating small amounts ~ 3 times daily; foods include fruit such as melon or fruit cups, broth-based soups, and gelatin.    Food-Related History:  Supplements: Ensure Original X 3 daily  Beverages:  16 oz water, 4 oz ginger ale    Physical Activity:  Physical activity level is sedentary with little to no exercise.     Daily Estimated Nutritional Needs:  Energy:  [Other (comment) (28-32 kcal/kg) using last recorded weight, 58 kg (04/03/23 1605)]  Protein:  [1.2-1.5 gm/kg using last recorded weight, 58 kg (04/03/23 1605)]  Carbohydrate:   [no restriction]  Fluid:   [1 mL/kcal (maintenance)]    Nutrition Goals & Evaluation    - Prevent >= 1% wt loss per week.  (New)  - Meet > 75% estimated nutrition needs daily.  (New)  - Adequate hydration.  (New)      Nutrition goals reviewed, relevant barriers identified and addressed: acuity of illness. She is evaluated to have good to fair willingness and ability to achieve nutrition goals.       Nutrition Assessment       Malnutrition related to pancreatic cancer as evidenced by severe wt loss, meeting < 75% estimated nutrition needs >= 1 month.    Nutrition Intervention      - Nutrition Counseling: Goal setting Problem solving  - Oral Nutrition Supplementation: High Calorie  - Vitamin/Mineral Supplementation: multivitamin    Nutrition Plan:   Recommend frequent bites/sips of high calorie/high protein foods throughout the day such as peanut butter crackers, yogurt, 1/2 sandwich with cheese and meat, grits with cheese, or pudding.  Recommend Ensure Plus (or Equate Plus) X 3-4 daily.   Hold multivitamin if drinking 4 or more Ensure shakes per day.  Try to consume 7-9 cups of fluids per day. Fluids may include water, tea, broth, gelatin, and shakes.  Recommend light activity such as walking at least 10-15 minutes per day. May break up into 3-5 minutes at a time.    Handouts provided: Taste and Smell Changes, Poor  Appetite, Ensure Recipes    Follow up will occur in 2-3 weeks, or as needed.       Food/Nutrition-related history, Anthropometric measurements, Biochemical data, medical tests, procedures, Nutrition-focused physical findings, Patient understanding or compliance with intervention and recommendations , and Effectiveness of nutrition interventions will be assessed at time of follow-up.       Recommendations for Clinical Team, Care Team , and Referring Provider :  - Encourage pt to follow nutrition plan as noted above.     Norma Green's referral order and comments were reviewed on 04/03/2023.She has an active referral order.     Time spent 25 minutes   I am located on-site and the patient is located on-site for this visit.

## 2023-04-03 NOTE — Unmapped (Signed)
Pharmacy: First Cycle Chemotherapy Patient Education    Chemotherapy regimen/agents: FOLFOX  Venous Access: port  Caregivers present for education: daughter    Norma Green is a 62 y.o. with pancreatic cancer. Chemotherapy education was provided to the patient by the oncology pharmacy team.    Side effects discussed included but were not limited to:   complications associated with myelosuppresion (such as infection/fever, fatigue, and bleeding), nausea/vomiting, diarrhea, cold sensitivities, mucositis, or fatigue.    The [facility] patient handout or the Hematology/Oncology Fellow on-call phone number for concerns after hours were given.The patient verbalized understanding of this information.  Medication reconciliation was NOT completed.    Reviewed antiemetics. Patient stated she had nausea with her previous chemotherapy. Let family know they could either take the dexamethasone 8 mg on days 2, 3 and 4 (leftover, but still in date from her previous chemo treatment) and use ondansetron and prochlorperazine PRN. OR she could just wait and see if she has nausea and only take the PRN medications.    Approximate time spent with patient: 15  Minutes.    Kennon Holter, PharmD, CPP

## 2023-04-04 NOTE — Unmapped (Signed)
1447 Pt arrived for scheduled infusion. Pt tolerated infusion and discharged home with family.

## 2023-04-04 NOTE — Unmapped (Signed)
Lab on 04/03/2023   Component Date Value Ref Range Status    CA 19-9 04/03/2023 >140,000 (H)  0 - 35 U/mL Final    Sodium 04/03/2023 139  135 - 145 mmol/L Final    Potassium 04/03/2023 3.7  3.5 - 5.1 mmol/L Final    Chloride 04/03/2023 103  98 - 107 mmol/L Final    CO2 04/03/2023 31.0  20.0 - 31.0 mmol/L Final    Anion Gap 04/03/2023 5  5 - 14 mmol/L Final    BUN 04/03/2023 8 (L)  9 - 23 mg/dL Final    Creatinine 57/84/6962 0.55  0.55 - 1.02 mg/dL Final    BUN/Creatinine Ratio 04/03/2023 15   Final    eGFR CKD-EPI (2021) Female 04/03/2023 >90  >=60 mL/min/1.40m2 Final    eGFR calculated with CKD-EPI 2021 equation in accordance with [employer] and [employer] of Nephrology Task Force recommendations.    Glucose 04/03/2023 121  70 - 179 mg/dL Final    Calcium 95/28/4132 9.0  8.7 - 10.4 mg/dL Final    Albumin 44/08/270 2.8 (L)  3.4 - 5.0 g/dL Final    Total Protein 04/03/2023 6.8  5.7 - 8.2 g/dL Final    Total Bilirubin 04/03/2023 0.6  0.3 - 1.2 mg/dL Final    AST 53/66/4403 35 (H)  <=34 U/L Final    ALT 04/03/2023 17  10 - 49 U/L Final    Alkaline Phosphatase 04/03/2023 203 (H)  46 - 116 U/L Final    Magnesium 04/03/2023 1.8  1.6 - 2.6 mg/dL Final    WBC 47/42/5956 10.5  3.6 - 11.2 10*9/L Final    RBC 04/03/2023 3.73 (L)  3.95 - 5.13 10*12/L Final    HGB 04/03/2023 9.6 (L)  11.3 - 14.9 g/dL Final    HCT 38/75/6433 29.6 (L)  34.0 - 44.0 % Final    MCV 04/03/2023 79.2  77.6 - 95.7 fL Final    MCH 04/03/2023 25.6 (L)  25.9 - 32.4 pg Final    MCHC 04/03/2023 32.4  32.0 - 36.0 g/dL Final    RDW 29/51/8841 16.7 (H)  12.2 - 15.2 % Final    MPV 04/03/2023 7.6  6.8 - 10.7 fL Final    Platelet 04/03/2023 230  150 - 450 10*9/L Final    Neutrophils % 04/03/2023 61.9  % Final    Lymphocytes % 04/03/2023 24.4  % Final    Monocytes % 04/03/2023 9.6  % Final    Eosinophils % 04/03/2023 3.3  % Final    Basophils % 04/03/2023 0.8  % Final    Absolute Neutrophils 04/03/2023 6.5  1.8 - 7.8 10*9/L Final Absolute Lymphocytes 04/03/2023 2.6  1.1 - 3.6 10*9/L Final    Absolute Monocytes 04/03/2023 1.0 (H)  0.3 - 0.8 10*9/L Final    Absolute Eosinophils 04/03/2023 0.3  0.0 - 0.5 10*9/L Final    Absolute Basophils 04/03/2023 0.1  0.0 - 0.1 10*9/L Final    Microcytosis 04/03/2023 Slight (A)  Not Present Final    Anisocytosis 04/03/2023 Slight (A)  Not Present Final    Hypochromasia 04/03/2023 Moderate (A)  Not Present Final

## 2023-04-07 NOTE — Unmapped (Signed)
The [facility] has made a third and final attempt to reach this patient to refill the following medication: Udenyca.      We have left voicemails on the following phone numbers: [phone] and have sent a text message to the following phone numbers: [phone] .    Dates contacted: 7/31, 8/8, 8/13  Last scheduled delivery: 01/21/23 (28 day supply)    The patient may be at risk of non-compliance with this medication. The patient should call the [facility] at [phone]  Option 4, then Option 1: Oncology to refill medication.    Willette Pa   [facility] Specialty Technician

## 2023-04-16 DIAGNOSIS — G893 Neoplasm related pain (acute) (chronic): Principal | ICD-10-CM

## 2023-04-16 DIAGNOSIS — C259 Malignant neoplasm of pancreas, unspecified: Principal | ICD-10-CM

## 2023-04-16 MED ORDER — MORPHINE 15 MG IMMEDIATE RELEASE TABLET
ORAL_TABLET | ORAL | 0 refills | 7 days | Status: CP | PRN
Start: 2023-04-16 — End: 2023-04-23

## 2023-04-16 NOTE — Unmapped (Signed)
Spoke with Ms. Rutkowski and her daughter.  Her closest friend and cousin Beckie Busing died unexpectedly.  He had been a daily companion for her recently and helping her as she is navigating the cancer diagnosis.  Services are tomorrow.    Cannot tolerate the oxycodone IR 10 mg tablet.  When she takes it has nausea.  Can tolerate the OxyContin 10 mg dosing at twice a day.  No constipation.  Bowel movements are daily and using MiraLAX daily.      Plan  -Have seen in the past that certain brands of oxycodone 10 mg are not tolerated by some patients.  Attribute this to a particular manufacturer brand.    -Stop oxycodone IR.  Will bring bottle in and we will dispose of it at [facility] next Friday.  -Continue OxyContin 10 mg twice a day  -Start MS IR 15 mg every 4 hours as needed pain x 1 week.  -Can do a trial of oxycodone IR when at [facility] for 1 day fill to see if that manufacture of oxycodone 10 is tolerated.    -Offered my condolences.    Burtis Junes, FNP-BC, [facility]  Outpatient Oncology Palliative Care Service  [facility]  [address] [place], [place] [address]  [phone]

## 2023-04-16 NOTE — Unmapped (Signed)
Returned Call to Norma Green  after receiving call from the communication center in reference to cancelling her appt tomorrow due to a death in a family.  She says she feels pretty good in regards to her body.     She is wanting her team to know that the new pain medications make her vomit now.  They are not agreeing with her.  The pink ones (10 mg) are the ones that make her vomit but the 5 mg (white ones) do not make her vomit.  Can she switch back to the 5 mg tablets instead.  She has a lot left of the pink ones.           Triage Recommendations:   I advised that I would reach out  to the care team and I would call back to update if needed.    Caller's Response:   She  was  appreciative of the call and is agreeable to this plan and will call back with any further questions/concerns      Outstanding tasks: Care team notified

## 2023-04-16 NOTE — Unmapped (Signed)
Hi,     Norma Green contacted the Communication Center regarding the following:    - is calling to speak with NP Tammy Triglianos to discuss her rescheduling tomorrows appts due to death in the family and being able to go to the funeral, states that she has some other things that she wants to discuss also    Please contact Norma Green at [phone].    Thanks in advance,    Norma Green  [facility] Cancer Communication Center   [phone]

## 2023-04-23 NOTE — Unmapped (Signed)
[facility]     CONSULTING PROVIDERS  [facility]  ____________________________________________________________________    CANCER HISTORY  Diagnosis: Pancreatic Adenocarcinoma, imaging concerning for metastatic disease to liver  1. 12/12/21: Presented with abdominal pain, weight loss found to have CBD obstruction with both pancreatic head and tail mass along with liver lesion. EUS/ERCP with CBD stent placement.  Biopsy of pancreatic tail mass with adenocarcinoma. Concern for metastatic disease with 1 cm liver lesion.  Also, local extension to left adrenal.   2. 12/26/21-12/2022 FOLFIRINOX (Cy 1 FOLFOX). Best response PR.   - 04/11/22 final oxaliplatin d/t neuropathy  - 12/2022 intrahepatic progresion  3. 01/23/23 -01/2023 gemcitabine + abraxane. Best response PD.   4. 03/13/23 FOLFOX (planned)    Molecular:  Somatic: KRAS, TP53, CCND3, TFEB, VEGFA, TMP 6.3 m/MB  Germline: negative   ____________________________________________________________________    ASSESSMENT  1. 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 Cancer associated abdominal pain -controlled  3. Opioid Induced Constipation -controlled  4. Chemo neuropathy, grade 1, ctm  5. Anorexia, weight loss  6. nausea  7. ECOG PS 1    RECOMMENDATIONS  1. FOLFOX   2. Oxycontin and MSIR per pall care  3. Continue Miralax and Senna  - consistent basis  4. Continue Eliquis 2.5mg  BID    5. Zofran,  Add olanzapine  6. RTC 2 weeks for labs, tx, 4 weeks for labs, visit, tx.  6 weeks for imaging    DISCUSSION  Norma Green continues to lose weight..  Reviewed nausea med, appreciate pall care recs.  She doesn't feel like chemo knocked her down too much and wants to continue at this point.       Medical decision making based high complexity of cancer, interpretation of cancer response, and treatment risk of complications and monitoring toxicity of chemotherapy.    ______________________________________________________________________  HISTORY     Norma Green is seen today at the [facility] for ongoing management regarding pancreatic cancer.      She comes in doing fair.  Not eating well, sensitive to smells, feels hungry but then has difficulty eating.  Has 1 1/2 protein shakes a day.  Losing weight.  Had some n/v after treatment.  Doesn't feel like her treatment knocked her down too much.    + pain, managing pani ok, sometimes tries not take pain meds because of side effects.  On Oxycontin, and using MSIR, maybe taking about twice a day. Moving bowels well, taking miralax daily.    Doing things around the house, but does rest frequently.    Husband is doing better, he is in rehab.    MEDICAL HISTORY  1. HTN  2. Anxiety    Allergies   Allergen Reactions    Venom-Honey Bee Swelling    Metronidazole Nausea And Vomiting     Medications reviewed in the EMR    SOCIAL HISTORY  Lives with family including husband. Smoker. No alcohol or drug use.  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

## 2023-04-24 ENCOUNTER — Other Ambulatory Visit: Admit: 2023-04-24 | Discharge: 2023-04-25 | Payer: MEDICAID

## 2023-04-24 ENCOUNTER — Ambulatory Visit: Admit: 2023-04-24 | Discharge: 2023-04-25 | Payer: MEDICAID

## 2023-04-24 DIAGNOSIS — R110 Nausea: Principal | ICD-10-CM

## 2023-04-24 DIAGNOSIS — T402X5A Adverse effect of other opioids, initial encounter: Principal | ICD-10-CM

## 2023-04-24 DIAGNOSIS — C259 Malignant neoplasm of pancreas, unspecified: Principal | ICD-10-CM

## 2023-04-24 DIAGNOSIS — G620 Drug-induced polyneuropathy: Principal | ICD-10-CM

## 2023-04-24 DIAGNOSIS — C787 Secondary malignant neoplasm of liver and intrahepatic bile duct: Principal | ICD-10-CM

## 2023-04-24 DIAGNOSIS — T451X5A Adverse effect of antineoplastic and immunosuppressive drugs, initial encounter: Principal | ICD-10-CM

## 2023-04-24 DIAGNOSIS — R634 Abnormal weight loss: Principal | ICD-10-CM

## 2023-04-24 DIAGNOSIS — Z09 Encounter for follow-up examination after completed treatment for conditions other than malignant neoplasm: Principal | ICD-10-CM

## 2023-04-24 DIAGNOSIS — K5903 Drug induced constipation: Principal | ICD-10-CM

## 2023-04-24 LAB — CBC W/ AUTO DIFF
BASOPHILS ABSOLUTE COUNT: 0 10*9/L (ref 0.0–0.1)
BASOPHILS RELATIVE PERCENT: 0.5 %
EOSINOPHILS ABSOLUTE COUNT: 0.1 10*9/L (ref 0.0–0.5)
EOSINOPHILS RELATIVE PERCENT: 1.6 %
HEMATOCRIT: 31.3 % — ABNORMAL LOW (ref 34.0–44.0)
HEMOGLOBIN: 10.3 g/dL — ABNORMAL LOW (ref 11.3–14.9)
LYMPHOCYTES ABSOLUTE COUNT: 2.6 10*9/L (ref 1.1–3.6)
LYMPHOCYTES RELATIVE PERCENT: 31.6 %
MEAN CORPUSCULAR HEMOGLOBIN CONC: 32.8 g/dL (ref 32.0–36.0)
MEAN CORPUSCULAR HEMOGLOBIN: 25.8 pg — ABNORMAL LOW (ref 25.9–32.4)
MEAN CORPUSCULAR VOLUME: 78.5 fL (ref 77.6–95.7)
MEAN PLATELET VOLUME: 7.6 fL (ref 6.8–10.7)
MONOCYTES ABSOLUTE COUNT: 0.8 10*9/L (ref 0.3–0.8)
MONOCYTES RELATIVE PERCENT: 10.2 %
NEUTROPHILS ABSOLUTE COUNT: 4.6 10*9/L (ref 1.8–7.8)
NEUTROPHILS RELATIVE PERCENT: 56.1 %
PLATELET COUNT: 234 10*9/L (ref 150–450)
RED BLOOD CELL COUNT: 3.99 10*12/L (ref 3.95–5.13)
RED CELL DISTRIBUTION WIDTH: 18.2 % — ABNORMAL HIGH (ref 12.2–15.2)
WBC ADJUSTED: 8.2 10*9/L (ref 3.6–11.2)

## 2023-04-24 LAB — COMPREHENSIVE METABOLIC PANEL
ALBUMIN: 2.7 g/dL — ABNORMAL LOW (ref 3.4–5.0)
ALKALINE PHOSPHATASE: 187 U/L — ABNORMAL HIGH (ref 46–116)
ALT (SGPT): 15 U/L (ref 10–49)
ANION GAP: 7 mmol/L (ref 5–14)
AST (SGOT): 28 U/L (ref <=34)
BILIRUBIN TOTAL: 0.6 mg/dL (ref 0.3–1.2)
BLOOD UREA NITROGEN: 8 mg/dL — ABNORMAL LOW (ref 9–23)
BUN / CREAT RATIO: 14
CALCIUM: 8.9 mg/dL (ref 8.7–10.4)
CHLORIDE: 101 mmol/L (ref 98–107)
CO2: 32 mmol/L — ABNORMAL HIGH (ref 20.0–31.0)
CREATININE: 0.58 mg/dL
EGFR CKD-EPI (2021) FEMALE: >90 mL/min/{1.73_m2} (ref >=60)
GLUCOSE RANDOM: 126 mg/dL (ref 70–179)
POTASSIUM: 3.8 mmol/L (ref 3.5–5.1)
PROTEIN TOTAL: 6.7 g/dL (ref 5.7–8.2)
SODIUM: 140 mmol/L (ref 135–145)

## 2023-04-24 LAB — CANCER ANTIGEN 19-9: CA 19-9: 125081.87 U/mL — ABNORMAL HIGH (ref 0–35)

## 2023-04-24 LAB — SLIDE REVIEW

## 2023-04-24 LAB — MAGNESIUM: MAGNESIUM: 1.8 mg/dL (ref 1.6–2.6)

## 2023-04-24 MED ORDER — OXYCODONE ER 10 MG TABLET,CRUSH RESISTANT,EXTENDED RELEASE 12 HR
ORAL_TABLET | Freq: Two times a day (BID) | ORAL | 0 refills | 30.00000 days | Status: CP
Start: 2023-04-24 — End: 2023-05-24

## 2023-04-24 MED ORDER — MORPHINE 15 MG IMMEDIATE RELEASE TABLET
ORAL_TABLET | ORAL | 0 refills | 20.00000 days | Status: CP | PRN
Start: 2023-04-24 — End: ?

## 2023-04-24 MED ORDER — OLANZAPINE 5 MG TABLET
ORAL_TABLET | Freq: Every evening | ORAL | 0 refills | 30.00000 days | Status: CP
Start: 2023-04-24 — End: 2023-05-24

## 2023-04-24 MED ADMIN — leuCOVorin 692 mg in dextrose 5 % 50 mL IVPB: 400 mg/m2 | INTRAVENOUS | @ 20:00:00 | Stop: 2023-04-24

## 2023-04-24 MED ADMIN — fluorouracil (ADRUCIL) 4,150 mg in sodium chloride (NS) 0.9 % 46-hr infusion CADD: 2400 mg/m2 | INTRAVENOUS | @ 23:00:00

## 2023-04-24 MED ADMIN — dexAMETHasone (DECADRON) 4 mg/mL injection 12 mg: 12 mg | INTRAVENOUS | @ 20:00:00 | Stop: 2023-04-24

## 2023-04-24 MED ADMIN — dextrose 5 % infusion: 100 mL/h | INTRAVENOUS | @ 20:00:00

## 2023-04-24 MED ADMIN — ondansetron (ZOFRAN) injection 8 mg: 8 mg | INTRAVENOUS | @ 20:00:00 | Stop: 2023-04-24

## 2023-04-24 MED ADMIN — oxaliplatin (ELOXATIN) 147.05 mg in dextrose 5 % 250 mL IVPB: 85 mg/m2 | INTRAVENOUS | @ 20:00:00 | Stop: 2023-04-24

## 2023-04-24 NOTE — Unmapped (Signed)
Patient arrived to chair 48 for C2D1. Labs within parameter for tx today. Port was flushed, + BR. Premeds given. Patient completed and tolerated infusion. Line checked for blood return and flushed. CADD pump connected. All clamps opened, green light flashing, pump reads running, and home supplies sent home with patient. AVS declined. Patient discharged to home, NAD.

## 2023-04-24 NOTE — Unmapped (Signed)
OUTPATIENT ONCOLOGY PALLIATIVE CARE NOTE         Principal Diagnosis: Norma Green is a 62 y.o. female with pancreatic adenocarcinoma, diagnosed in 11/2021. Disease sites include liver.     Assessment/Plan:     #Cancer-Related Pain: Abd  -Continue Norma IR 15 mg every 4 hours as needed for pain.  Prescription sent today  -Continue OxyContin 10 mg twice daily      #Potential for opioid-induced constipation  -Continue MiraLAX-as needed. This medication is mush.  Maximum dose is 4 times/day.  -May need to start Senokot 1 tablet once a day a day as needed.  May increase Senokot by adding 1-2 extra tablets per day.  This medication is push.  Maximum dose is 8 tablets/day.  -For opioid-induced constipation need both mush and push medications.     # Anorexia/weight loss  -Connected with dietitian-trying to follow their recommendations.  Drinking Ensure  -Start olanzapine 5 mg at bedtime    Meds to support appetite  -Remeron-not effective    # Mood/coping-sharing how difficult it has been to see recent people that she is love die and also to see the decline in her body.  -Provided empathetic listening      #Goals of Care/Prognostic Awareness:  -Discussed with daughter balance of trying to eat more and understanding that it can be difficult for patients to ingest food due to the cancer.      -At prior visits: Shared changing tx due to progression on scans. Her spouse is currently intubated here at [facility].  -Intentionally remaining positive despite the difficulties that are occurring presently in her life.  Has faith in her spiritual practice.  Daughter is highly supportive.      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 Would like to have her own [place].     #Advance Care Planning:  HCDM:   HCDM (patient stated preference): Norma Green - Son - [phone]        # Controlled substances risk management.   - Patient does not have a signed pain medication agreement with our team.   - [place]CCS[place] database was reviewed today and it was appropriate.   - Urine drug screen was not performed at this visit. Findings: not applicable.   - Patient has received information about safe storage and administration of medications.       Current cancer-directed therapy:  gem/Abraxane     F/u:9/27    ----------------------------------------    Oncology Team: Eula Fried DNP, Dr. Forbes Cellar and Ted Mcalpine RN  PCP: [facility] [place]      HPI: 62 y.o. 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 Recent hospitalization on 2/10 to 2/14 for hematemesis and melena and found to have MW tear and occluded CBD stent. Went to OR for EGD ans was found to have two MW tears at [employer] junction, s/p 2 clips. Also with E. Coli bacteremia iso stent occlusion and stent associated cholangitis. ERCP was performed on 2/12 with successful removal of stent obstruction.    Symptom Review:  Pain: abd-sore sensation  Fatigue: improving after hospitalization. Family is doing cooking and cleaning.   Mobility: no devices  Sleep: good  Appetite: good  Nausea: intermittent-controlled   Bowel function: daily-uses miralax every other day-has prn imodium after chemo  Dyspnea: denies  Mood: ok-has moments of feeling down      Interval history 04/24/23 CK, Norma Green, Norma and Eula Fried DNP  Symptom Review:  General: Not so good.   Pain: abd pain controlled with prn MSIR-2/day and oxycontin 2x/day   Fatigue: energy not so good  Appetite: poor  Bowel function: daily with miralax  Mood:  it has been hard emotionally     Palliative Performance Scale: 70% - Ambulation: Reduced / unable to do normal work, some evidence of disease / Self-Care: Full / Intake: Normal or reduced / Level of Conscious: Full      Social History:   Lives with family-dtr-Norma Green [phone]) [phone] and her grandchildren. Grandchildren are ages: 46, 2 and 6 months. Highly active in [employer] Volunteers to serve others in [employer]. 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   Hobbies: Her grandchildren    Objective     Opioid Risk Tool:    Female  Female    Family history of substance abuse      Alcohol  1  3    Illegal drugs  2  3    Rx drugs  4  4    Personal history of substance abuse      Alcohol  3  3    Illegal drugs  4  4    Rx drugs  5  5    Age between 16--45 years  1  1    History of preadolescent sexual abuse  3  0    Psychological disease      ADD, OCD, bipolar, schizophrenia  2  2    Depression  1  1       Total: na  (<3 low risk, 4-7 moderate risk, >8 high risk)    Hematology/Oncology History   Pancreatic adenocarcinoma (CMS-HCC)   12/13/2021 Initial Diagnosis    Pancreatic adenocarcinoma (CMS-HCC)     12/23/2021 -  Cancer Staged    Staging form: Exocrine Pancreas, AJCC 8th Edition  - Clinical: Stage IV (cM1) - Signed by Roni Bread, MD on 12/23/2021       12/26/2021 -  Chemotherapy    OP GI mFOLFIRINOX  oxaliplatin 85 mg/m2 IV on day 1, irinotecan 150 mg/m2 IV on day 1, leucovorin 400 mg/m2 IV on day 1, fluorouracil 2,400 mg/m2 CIVI over 46 hours on day 1, every 14 days         Past Medical History:   Diagnosis Date    Allergic rhinitis 12/30/2007    Hypertension     Murmur, heart     Sleep related leg cramps 11/03/2011    Tobacco use disorder 11/03/2011       Allergies:   Allergies   Allergen Reactions    Venom-Honey Bee Swelling    Metronidazole Nausea And Vomiting       Family History:  family history includes Coronary artery disease in her mother; Diabetes in her father and sister; Hypertension in her mother; No Known Problems in her daughter, maternal grandfather, maternal grandmother, paternal grandfather, paternal grandmother, and another family member; Stroke in her mother.  She indicated that her mother is deceased. She indicated that her father is deceased. She indicated that the status of her sister is unknown. She indicated that the status of her maternal grandmother is unknown. She indicated that the status of her maternal grandfather is unknown. She indicated that the status of her paternal grandmother is unknown. She indicated that the status of her paternal grandfather is unknown. She  indicated that the status of her daughter is unknown. She indicated that the status of her neg hx is unknown. She indicated that the status of her other is unknown.      REVIEW OF SYSTEMS:  A comprehensive review of 10 systems was negative except for pertinent positives noted in HPI.    PHYSICAL EXAM:   Vital signs for this encounter: VS reviewed in EPIC.  GEN: Awake and alert, pleasant appearing female in no acute distress  HEENT: No scleral icterus. No facial asymmetry.   LUNGS:No increased work of breathing.  SKIN: No rashes, petechiae or jaundice noted on examined skin  MSK: Mod sarcopenia,   EXT: No edema noted of the lower extremities  NEURO: No focal deficits appreciated. No myoclonus.  PSYCH: Alert and oriented to person, place and time. Euthymic.           I personally spent 40 minutes face-to-face and non-face-to-face in the care of this patient, which includes all pre, intra, and post visit time on the date of service.  All documented time was specific to the E/M visit and does not include any procedures that may have been performed.    Pam Drown, FNP-BC, [facility]  [facility]   [facility]  [address], [place], [place] [address]

## 2023-04-25 NOTE — Unmapped (Signed)
Lab on 04/24/2023   Component Date Value Ref Range Status    Sodium 04/24/2023 140  135 - 145 mmol/L Final    Potassium 04/24/2023 3.8  3.5 - 5.1 mmol/L Final    Chloride 04/24/2023 101  98 - 107 mmol/L Final    CO2 04/24/2023 32.0 (H)  20.0 - 31.0 mmol/L Final    Anion Gap 04/24/2023 7  5 - 14 mmol/L Final    BUN 04/24/2023 8 (L)  9 - 23 mg/dL Final    Creatinine 95/62/1308 0.58  0.55 - 1.02 mg/dL Final    BUN/Creatinine Ratio 04/24/2023 14   Final    eGFR CKD-EPI (2021) Female 04/24/2023 >90  >=60 mL/min/1.83m2 Final    eGFR calculated with CKD-EPI 2021 equation in accordance with [employer] and [employer] of Nephrology Task Force recommendations.    Glucose 04/24/2023 126  70 - 179 mg/dL Final    Calcium 65/78/4696 8.9  8.7 - 10.4 mg/dL Final    Albumin 29/52/8413 2.7 (L)  3.4 - 5.0 g/dL Final    Total Protein 04/24/2023 6.7  5.7 - 8.2 g/dL Final    Total Bilirubin 04/24/2023 0.6  0.3 - 1.2 mg/dL Final    AST 24/40/1027 28  <=34 U/L Final    ALT 04/24/2023 15  10 - 49 U/L Final    Alkaline Phosphatase 04/24/2023 187 (H)  46 - 116 U/L Final    Magnesium 04/24/2023 1.8  1.6 - 2.6 mg/dL Final    WBC 25/36/6440 8.2  3.6 - 11.2 10*9/L Final    RBC 04/24/2023 3.99  3.95 - 5.13 10*12/L Final    HGB 04/24/2023 10.3 (L)  11.3 - 14.9 g/dL Final    HCT 34/74/2595 31.3 (L)  34.0 - 44.0 % Final    MCV 04/24/2023 78.5  77.6 - 95.7 fL Final    MCH 04/24/2023 25.8 (L)  25.9 - 32.4 pg Final    MCHC 04/24/2023 32.8  32.0 - 36.0 g/dL Final    RDW 63/87/5643 18.2 (H)  12.2 - 15.2 % Final    MPV 04/24/2023 7.6  6.8 - 10.7 fL Final    Platelet 04/24/2023 234  150 - 450 10*9/L Final    Neutrophils % 04/24/2023 56.1  % Final    Lymphocytes % 04/24/2023 31.6  % Final    Monocytes % 04/24/2023 10.2  % Final    Eosinophils % 04/24/2023 1.6  % Final    Basophils % 04/24/2023 0.5  % Final    Absolute Neutrophils 04/24/2023 4.6  1.8 - 7.8 10*9/L Final    Absolute Lymphocytes 04/24/2023 2.6  1.1 - 3.6 10*9/L Final Absolute Monocytes 04/24/2023 0.8  0.3 - 0.8 10*9/L Final    Absolute Eosinophils 04/24/2023 0.1  0.0 - 0.5 10*9/L Final    Absolute Basophils 04/24/2023 0.0  0.0 - 0.1 10*9/L Final    Microcytosis 04/24/2023 Slight (A)  Not Present Final    Anisocytosis 04/24/2023 Slight (A)  Not Present Final    Hypochromasia 04/24/2023 Moderate (A)  Not Present Final    Smear Review Comments 04/24/2023 See Comment (A)  Undefined Final    Slide reviewed.  [id number]

## 2023-05-01 NOTE — Unmapped (Signed)
Clinical Pharmacist Brief Note:    Discontinued prescription for pegfilgrastim as patient is not currently receiving it and plan to add if it is needed in future.    Trilby Drummer, PharmD, BCOP, CPP  GI Oncology Clinical Pharmacist Practitioner

## 2023-05-01 NOTE — Unmapped (Signed)
Upon investigation, one additional attempt for patient contact was warranted.  [facility] reached out to patient on 04/30/23 to schedule delivery for Udenyca.  Last dispensed on 01/22/23.     Kermit Balo, [facility]  [facility] Specialty Pharmacist

## 2023-05-01 NOTE — Unmapped (Signed)
Specialty Medication(s): Norma Keen    [name] has been dis-enrolled from the [facility] specialty pharmacy services due to  unable to contact patient for delivery and she has not been using injection.  Prescription discontinued by provider. .    Additional information provided to the patient: NA    Kermit Balo, [facility]  [facility] Specialty Pharmacist

## 2023-05-08 ENCOUNTER — Ambulatory Visit: Admit: 2023-05-08 | Discharge: 2023-05-09 | Payer: MEDICAID

## 2023-05-08 MED ORDER — OXYCODONE 10 MG TABLET
ORAL_TABLET | ORAL | 0 refills | 17.00000 days | Status: CP | PRN
Start: 2023-05-08 — End: ?
  Filled 2023-05-08: qty 100, 17d supply, fill #0

## 2023-05-11 DIAGNOSIS — C259 Malignant neoplasm of pancreas, unspecified: Principal | ICD-10-CM

## 2023-05-15 ENCOUNTER — Other Ambulatory Visit: Admit: 2023-05-15 | Discharge: 2023-05-16 | Payer: MEDICAID

## 2023-05-15 ENCOUNTER — Ambulatory Visit: Admit: 2023-05-15 | Discharge: 2023-05-16 | Payer: MEDICAID

## 2023-05-15 DIAGNOSIS — C259 Malignant neoplasm of pancreas, unspecified: Principal | ICD-10-CM

## 2023-05-20 DIAGNOSIS — C259 Malignant neoplasm of pancreas, unspecified: Principal | ICD-10-CM

## 2023-05-26 DIAGNOSIS — C259 Malignant neoplasm of pancreas, unspecified: Principal | ICD-10-CM

## 2023-05-29 ENCOUNTER — Ambulatory Visit: Admit: 2023-05-29 | Discharge: 2023-05-30 | Payer: MEDICAID

## 2023-05-29 ENCOUNTER — Other Ambulatory Visit: Admit: 2023-05-29 | Discharge: 2023-05-30 | Payer: MEDICAID

## 2023-05-29 DIAGNOSIS — R630 Anorexia: Principal | ICD-10-CM

## 2023-05-29 DIAGNOSIS — G893 Neoplasm related pain (acute) (chronic): Principal | ICD-10-CM

## 2023-05-29 DIAGNOSIS — R634 Abnormal weight loss: Principal | ICD-10-CM

## 2023-05-29 DIAGNOSIS — Z09 Encounter for follow-up examination after completed treatment for conditions other than malignant neoplasm: Principal | ICD-10-CM

## 2023-05-29 DIAGNOSIS — K5903 Drug induced constipation: Principal | ICD-10-CM

## 2023-05-29 DIAGNOSIS — T451X5A Adverse effect of antineoplastic and immunosuppressive drugs, initial encounter: Principal | ICD-10-CM

## 2023-05-29 DIAGNOSIS — C259 Malignant neoplasm of pancreas, unspecified: Principal | ICD-10-CM

## 2023-05-29 DIAGNOSIS — Z79899 Other long term (current) drug therapy: Principal | ICD-10-CM

## 2023-05-29 DIAGNOSIS — C787 Secondary malignant neoplasm of liver and intrahepatic bile duct: Principal | ICD-10-CM

## 2023-05-29 DIAGNOSIS — G620 Drug-induced polyneuropathy: Principal | ICD-10-CM

## 2023-05-29 DIAGNOSIS — T402X5A Adverse effect of other opioids, initial encounter: Principal | ICD-10-CM

## 2023-05-29 MED ORDER — LIDOCAINE-PRILOCAINE 2.5 %-2.5 % TOPICAL CREAM
3 refills | 0.00000 days | Status: CP
Start: 2023-05-29 — End: 2024-05-27
  Filled 2023-05-29: qty 30, 20d supply, fill #0

## 2023-05-29 MED ORDER — OLANZAPINE 5 MG TABLET
ORAL_TABLET | Freq: Every evening | ORAL | 0 refills | 30.00000 days | Status: CP
Start: 2023-05-29 — End: 2023-06-28
  Filled 2023-05-29: qty 30, 30d supply, fill #0

## 2023-05-29 MED ORDER — MORPHINE 15 MG IMMEDIATE RELEASE TABLET
ORAL_TABLET | ORAL | 0 refills | 15.00000 days | Status: CP | PRN
Start: 2023-05-29 — End: ?
  Filled 2023-05-29: qty 90, 15d supply, fill #0

## 2023-05-29 MED ORDER — OXYCODONE ER 10 MG TABLET,CRUSH RESISTANT,EXTENDED RELEASE 12 HR
ORAL_TABLET | Freq: Every evening | ORAL | 0 refills | 30.00000 days | Status: CP
Start: 2023-05-29 — End: 2023-06-28
  Filled 2023-05-29: qty 30, 30d supply, fill #0

## 2023-06-04 ENCOUNTER — Ambulatory Visit: Admit: 2023-06-04 | Discharge: 2023-06-04 | Payer: MEDICAID

## 2023-06-05 ENCOUNTER — Ambulatory Visit
Admit: 2023-06-05 | Discharge: 2023-06-05 | Payer: MEDICAID | Attending: Hematology & Oncology | Primary: Hematology & Oncology

## 2023-06-05 ENCOUNTER — Ambulatory Visit: Admit: 2023-06-05 | Discharge: 2023-06-05 | Payer: MEDICAID

## 2023-06-05 ENCOUNTER — Other Ambulatory Visit: Admit: 2023-06-05 | Discharge: 2023-06-05 | Payer: MEDICAID

## 2023-06-05 DIAGNOSIS — C259 Malignant neoplasm of pancreas, unspecified: Principal | ICD-10-CM

## 2023-06-05 DIAGNOSIS — R634 Abnormal weight loss: Principal | ICD-10-CM

## 2023-06-05 DIAGNOSIS — R630 Anorexia: Principal | ICD-10-CM

## 2023-06-05 DIAGNOSIS — R530 Neoplastic (malignant) related fatigue: Principal | ICD-10-CM

## 2023-06-05 DIAGNOSIS — Z515 Encounter for palliative care: Principal | ICD-10-CM

## 2023-06-05 MED ORDER — LACTULOSE 10 GRAM/15 ML (15 ML) ORAL SOLUTION
Freq: Two times a day (BID) | ORAL | 0 refills | 8 days | Status: CP | PRN
Start: 2023-06-05 — End: ?

## 2023-06-05 MED ORDER — DEXAMETHASONE 4 MG TABLET
ORAL_TABLET | Freq: Every day | ORAL | 0 refills | 30 days | Status: CP
Start: 2023-06-05 — End: 2023-07-05

## 2023-06-16 ENCOUNTER — Telehealth: Admit: 2023-06-16 | Discharge: 2023-06-17 | Payer: MEDICAID

## 2023-06-16 DIAGNOSIS — C787 Secondary malignant neoplasm of liver and intrahepatic bile duct: Principal | ICD-10-CM

## 2023-06-16 DIAGNOSIS — R634 Abnormal weight loss: Principal | ICD-10-CM

## 2023-06-16 DIAGNOSIS — R530 Neoplastic (malignant) related fatigue: Principal | ICD-10-CM

## 2023-06-16 DIAGNOSIS — Z7189 Other specified counseling: Principal | ICD-10-CM

## 2023-06-16 DIAGNOSIS — Z515 Encounter for palliative care: Principal | ICD-10-CM

## 2023-06-16 DIAGNOSIS — G893 Neoplasm related pain (acute) (chronic): Principal | ICD-10-CM

## 2023-06-19 ENCOUNTER — Telehealth
Admit: 2023-06-19 | Discharge: 2023-06-20 | Payer: MEDICAID | Attending: Hematology & Oncology | Primary: Hematology & Oncology

## 2023-06-19 DIAGNOSIS — C259 Malignant neoplasm of pancreas, unspecified: Principal | ICD-10-CM

## 2023-06-25 DIAGNOSIS — C259 Malignant neoplasm of pancreas, unspecified: Principal | ICD-10-CM

## 2023-06-26 DIAGNOSIS — C259 Malignant neoplasm of pancreas, unspecified: Principal | ICD-10-CM

## 2023-07-16 DIAGNOSIS — C259 Malignant neoplasm of pancreas, unspecified: Principal | ICD-10-CM

## 2023-07-26 DEATH — deceased
